# Patient Record
Sex: Female | Born: 1955 | Race: Black or African American | Hispanic: No | Smoking: Former smoker
Health system: Southern US, Community
[De-identification: ages and names within clinical notes are randomized; demographics above are authoritative.]

## PROBLEM LIST (undated history)

## (undated) DIAGNOSIS — I10 Essential (primary) hypertension: Secondary | ICD-10-CM

## (undated) DIAGNOSIS — C801 Malignant (primary) neoplasm, unspecified: Secondary | ICD-10-CM

## (undated) DIAGNOSIS — G4730 Sleep apnea, unspecified: Secondary | ICD-10-CM

## (undated) DIAGNOSIS — C50919 Malignant neoplasm of unspecified site of unspecified female breast: Secondary | ICD-10-CM

## (undated) DIAGNOSIS — K069 Disorder of gingiva and edentulous alveolar ridge, unspecified: Secondary | ICD-10-CM

## (undated) DIAGNOSIS — N63 Unspecified lump in unspecified breast: Secondary | ICD-10-CM

## (undated) DIAGNOSIS — E785 Hyperlipidemia, unspecified: Secondary | ICD-10-CM

## (undated) DIAGNOSIS — Z803 Family history of malignant neoplasm of breast: Secondary | ICD-10-CM

## (undated) DIAGNOSIS — R32 Unspecified urinary incontinence: Secondary | ICD-10-CM

## (undated) DIAGNOSIS — Z1371 Encounter for nonprocreative screening for genetic disease carrier status: Secondary | ICD-10-CM

## (undated) DIAGNOSIS — R7303 Prediabetes: Secondary | ICD-10-CM

## (undated) DIAGNOSIS — Z8042 Family history of malignant neoplasm of prostate: Secondary | ICD-10-CM

## (undated) HISTORY — PX: MASS EXCISION: SHX2000

## (undated) HISTORY — DX: Disorder of gingiva and edentulous alveolar ridge, unspecified: K06.9

## (undated) HISTORY — DX: Unspecified urinary incontinence: R32

## (undated) HISTORY — DX: Family history of malignant neoplasm of prostate: Z80.42

## (undated) HISTORY — PX: COLONOSCOPY: SHX174

## (undated) HISTORY — DX: Malignant (primary) neoplasm, unspecified: C80.1

## (undated) HISTORY — PX: LAPAROSCOPIC GASTRIC BANDING: SHX1100

## (undated) HISTORY — DX: Family history of malignant neoplasm of breast: Z80.3

## (undated) HISTORY — PX: FOOT MASS EXCISION: SHX1663

## (undated) HISTORY — PX: SOFT TISSUE CYST EXCISION: SHX2418

## (undated) HISTORY — DX: Sleep apnea, unspecified: G47.30

## (undated) HISTORY — DX: Essential (primary) hypertension: I10

## (undated) HISTORY — DX: Unspecified lump in unspecified breast: N63.0

## (undated) HISTORY — DX: Encounter for nonprocreative screening for genetic disease carrier status: Z13.71

## (undated) HISTORY — PX: VAGINAL MASS EXCISION: SHX2640

## (undated) HISTORY — DX: Malignant neoplasm of unspecified site of unspecified female breast: C50.919

---

## 1998-08-15 ENCOUNTER — Other Ambulatory Visit: Admission: RE | Admit: 1998-08-15 | Discharge: 1998-08-15 | Payer: Self-pay | Admitting: Obstetrics and Gynecology

## 1999-04-27 ENCOUNTER — Other Ambulatory Visit: Admission: RE | Admit: 1999-04-27 | Discharge: 1999-04-27 | Payer: Self-pay | Admitting: Gynecology

## 1999-05-11 HISTORY — PX: OTHER SURGICAL HISTORY: SHX169

## 1999-06-08 ENCOUNTER — Other Ambulatory Visit: Admission: RE | Admit: 1999-06-08 | Discharge: 1999-06-08 | Payer: Self-pay | Admitting: Gynecology

## 1999-06-08 ENCOUNTER — Encounter (INDEPENDENT_AMBULATORY_CARE_PROVIDER_SITE_OTHER): Payer: Self-pay | Admitting: Specialist

## 1999-08-14 ENCOUNTER — Encounter: Payer: Self-pay | Admitting: Anesthesiology

## 1999-08-14 ENCOUNTER — Inpatient Hospital Stay (HOSPITAL_COMMUNITY): Admission: RE | Admit: 1999-08-14 | Discharge: 1999-08-19 | Payer: Self-pay | Admitting: Gynecology

## 1999-08-15 ENCOUNTER — Encounter: Payer: Self-pay | Admitting: Gynecology

## 2000-11-22 ENCOUNTER — Other Ambulatory Visit: Admission: RE | Admit: 2000-11-22 | Discharge: 2000-11-22 | Payer: Self-pay | Admitting: Gynecology

## 2000-12-07 ENCOUNTER — Ambulatory Visit (HOSPITAL_COMMUNITY): Admission: RE | Admit: 2000-12-07 | Discharge: 2000-12-07 | Payer: Self-pay | Admitting: Gynecology

## 2002-10-18 ENCOUNTER — Other Ambulatory Visit: Admission: RE | Admit: 2002-10-18 | Discharge: 2002-10-18 | Payer: Self-pay | Admitting: Gynecology

## 2002-11-02 ENCOUNTER — Encounter: Payer: Self-pay | Admitting: Gynecology

## 2002-11-02 ENCOUNTER — Encounter: Admission: RE | Admit: 2002-11-02 | Discharge: 2002-11-02 | Payer: Self-pay | Admitting: Gynecology

## 2004-08-27 ENCOUNTER — Other Ambulatory Visit: Admission: RE | Admit: 2004-08-27 | Discharge: 2004-08-27 | Payer: Self-pay | Admitting: Gynecology

## 2004-12-21 ENCOUNTER — Encounter: Admission: RE | Admit: 2004-12-21 | Discharge: 2004-12-21 | Payer: Self-pay | Admitting: Gynecology

## 2006-02-21 ENCOUNTER — Other Ambulatory Visit: Admission: RE | Admit: 2006-02-21 | Discharge: 2006-02-21 | Payer: Self-pay | Admitting: Gynecology

## 2006-06-23 ENCOUNTER — Ambulatory Visit: Payer: Self-pay | Admitting: Vascular Surgery

## 2006-06-23 ENCOUNTER — Ambulatory Visit (HOSPITAL_COMMUNITY): Admission: RE | Admit: 2006-06-23 | Discharge: 2006-06-23 | Payer: Self-pay | Admitting: Internal Medicine

## 2007-03-24 ENCOUNTER — Other Ambulatory Visit: Admission: RE | Admit: 2007-03-24 | Discharge: 2007-03-24 | Payer: Self-pay | Admitting: Gynecology

## 2008-04-12 ENCOUNTER — Ambulatory Visit: Payer: Self-pay | Admitting: Gynecology

## 2008-04-12 ENCOUNTER — Encounter: Payer: Self-pay | Admitting: Gynecology

## 2008-04-12 ENCOUNTER — Other Ambulatory Visit: Admission: RE | Admit: 2008-04-12 | Discharge: 2008-04-12 | Payer: Self-pay | Admitting: Gynecology

## 2008-04-19 ENCOUNTER — Ambulatory Visit: Payer: Self-pay | Admitting: Gynecology

## 2008-07-04 ENCOUNTER — Ambulatory Visit: Payer: Self-pay | Admitting: Gynecology

## 2008-07-26 ENCOUNTER — Ambulatory Visit: Payer: Self-pay | Admitting: Gynecology

## 2009-05-30 ENCOUNTER — Other Ambulatory Visit: Admission: RE | Admit: 2009-05-30 | Discharge: 2009-05-30 | Payer: Self-pay | Admitting: Gynecology

## 2009-05-30 ENCOUNTER — Ambulatory Visit: Payer: Self-pay | Admitting: Gynecology

## 2009-06-06 ENCOUNTER — Ambulatory Visit: Payer: Self-pay | Admitting: Gynecology

## 2009-07-30 ENCOUNTER — Ambulatory Visit: Payer: Self-pay | Admitting: Gynecology

## 2010-01-16 ENCOUNTER — Ambulatory Visit (HOSPITAL_COMMUNITY): Admission: RE | Admit: 2010-01-16 | Discharge: 2010-01-16 | Payer: Self-pay | Admitting: General Surgery

## 2010-01-23 ENCOUNTER — Ambulatory Visit (HOSPITAL_COMMUNITY): Admission: RE | Admit: 2010-01-23 | Discharge: 2010-01-23 | Payer: Self-pay | Admitting: General Surgery

## 2010-02-12 ENCOUNTER — Encounter
Admission: RE | Admit: 2010-02-12 | Discharge: 2010-02-12 | Payer: Self-pay | Source: Home / Self Care | Attending: General Surgery | Admitting: General Surgery

## 2010-05-10 DIAGNOSIS — C801 Malignant (primary) neoplasm, unspecified: Secondary | ICD-10-CM

## 2010-05-10 HISTORY — DX: Malignant (primary) neoplasm, unspecified: C80.1

## 2010-05-10 HISTORY — PX: BREAST LUMPECTOMY: SHX2

## 2010-05-14 ENCOUNTER — Encounter
Admission: RE | Admit: 2010-05-14 | Discharge: 2010-06-09 | Payer: Self-pay | Source: Home / Self Care | Attending: General Surgery | Admitting: General Surgery

## 2010-05-25 LAB — SURGICAL PCR SCREEN
MRSA, PCR: NEGATIVE
Staphylococcus aureus: NEGATIVE

## 2010-05-25 LAB — CBC
HCT: 39.7 % (ref 36.0–46.0)
Hemoglobin: 13.2 g/dL (ref 12.0–15.0)
MCH: 27.6 pg (ref 26.0–34.0)
MCHC: 33.2 g/dL (ref 30.0–36.0)
MCV: 83.1 fL (ref 78.0–100.0)
Platelets: 292 10*3/uL (ref 150–400)
RBC: 4.78 MIL/uL (ref 3.87–5.11)
RDW: 13.6 % (ref 11.5–15.5)
WBC: 4.3 10*3/uL (ref 4.0–10.5)

## 2010-05-25 LAB — COMPREHENSIVE METABOLIC PANEL
ALT: 17 U/L (ref 0–35)
AST: 17 U/L (ref 0–37)
Albumin: 4 g/dL (ref 3.5–5.2)
Alkaline Phosphatase: 57 U/L (ref 39–117)
BUN: 21 mg/dL (ref 6–23)
CO2: 28 mEq/L (ref 19–32)
Calcium: 9.7 mg/dL (ref 8.4–10.5)
Chloride: 99 mEq/L (ref 96–112)
Creatinine, Ser: 0.98 mg/dL (ref 0.4–1.2)
GFR calc Af Amer: >60 mL/min (ref >60)
GFR calc non Af Amer: 59 mL/min — ABNORMAL LOW (ref >60)
Glucose, Bld: 81 mg/dL (ref 70–99)
Potassium: 4 mEq/L (ref 3.5–5.1)
Sodium: 139 mEq/L (ref 135–145)
Total Bilirubin: 0.8 mg/dL (ref 0.3–1.2)
Total Protein: 7.9 g/dL (ref 6.0–8.3)

## 2010-05-25 LAB — DIFFERENTIAL
Basophils Absolute: 0 10*3/uL (ref 0.0–0.1)
Basophils Relative: 0 % (ref 0–1)
Eosinophils Absolute: 0 10*3/uL (ref 0.0–0.7)
Eosinophils Relative: 1 % (ref 0–5)
Lymphocytes Relative: 31 % (ref 12–46)
Lymphs Abs: 1.4 10*3/uL (ref 0.7–4.0)
Monocytes Absolute: 0.4 10*3/uL (ref 0.1–1.0)
Monocytes Relative: 10 % (ref 3–12)
Neutro Abs: 2.5 10*3/uL (ref 1.7–7.7)
Neutrophils Relative %: 58 % (ref 43–77)

## 2010-05-26 ENCOUNTER — Encounter (INDEPENDENT_AMBULATORY_CARE_PROVIDER_SITE_OTHER): Payer: Self-pay | Admitting: General Surgery

## 2010-05-26 ENCOUNTER — Ambulatory Visit (HOSPITAL_COMMUNITY)
Admission: RE | Admit: 2010-05-26 | Discharge: 2010-05-27 | Payer: Self-pay | Source: Home / Self Care | Attending: General Surgery | Admitting: General Surgery

## 2010-05-29 NOTE — Op Note (Signed)
[name], [name]                ACCOUNT NO.:  [id number]  MEDICAL RECORD NO.:  [id number]          PATIENT TYPE:  AMB  LOCATION:  DAY                          FACILITY:  [facility]  PHYSICIAN:  Sharlet Salina T. Ravon Mcilhenny, M.D.DATE OF BIRTH:  1955-11-09  DATE OF PROCEDURE:  05/26/2010 DATE OF DISCHARGE:                              OPERATIVE REPORT   PREOPERATIVE DIAGNOSIS:  Morbid obesity.  POSTOPERATIVE DIAGNOSIS:  Morbid obesity.  SURGICAL PROCEDURE:  Placement of laparoscopic adjustable gastric band.  SURGEON:  Lorne Skeens. Hartlee Amedee, M.D.  ASSISTANT:  Mary Sella. Andrey Campanile, MD  ANESTHESIA:  General.  BRIEF HISTORY:  [name] is a 55 year old female with progressive morbid obesity unresponsive to multiple attempts at medical management. She presents with a BMI of 46.5 and comorbidities of obstructive sleep apnea, hypertension and prediabetes.  After extensive preoperative workup and discussion detailed elsewhere, we have elected to proceed with placement of laparoscopic adjustable gastric band for treatment of her morbid obesity.  DESCRIPTION OF OPERATION:  The patient was brought to the operating room, placed in supine position on the operating table and general endotracheal anesthesia was induced.  She received preoperative IV antibiotics.  PAS were in place.  Subcutaneous heparin was administered. Correct patient and procedure were verified.  The patient additionally had a small skin tag in her left upper quadrant that she wanted excised and this was amputated across its base and the specimen disposed.  It measured just about 2 mm.  Trocar sites were infiltrated with local anesthesia.  Access was obtained in the left upper quadrant midclavicular line with an 11-mm OptiVu trocar without difficulty and pneumoperitoneum established.  The liver was noted to be moderately enlarged and had several cysts apparent.  Also, in the anterior mid left lobe, there was an approximately 2-cm  puckered irregular area which I thought most likely represented scarring secondary to underlying cyst but could not rule out neoplasm and therefore elected to biopsy this prior to completion of the procedure.  Initially, we proceeded with lap band placement by placing a 15-mm trocar in the right upper quadrant, an 11-mm trocar in right upper quadrant midclavicular line, another 11-mm trocar just above and left of the umbilicus for the camera port and a 5- mm trocar in the left flank.  The patient was placed in steep reverse Trendelenburg and through a 5-mm subxiphoid site, the [facility] retractor was placed and left lobe of the liver elevated and although it was enlarged, we did get good exposure of the upper stomach and hiatus. There was quite a bit of fat around the upper stomach.  The angle of His was exposed.  The peritoneum overlying the left crus was incised and careful blunt dissection carried back down along the left crus toward the retrogastric space.  Following this, the pars flaccida was exposed and there was a clear avascular area that was incised and the base of the right crus identified.  We did advance a sizing tube into the stomach and with the balloon inflated to 15 mL, we pulled back snugly against the hiatus with no evidence of hernia.  She did not have  a hernia on upper GI series although does have some mild to moderate reflux.  The balloon was deflated.  The tube pulled back into the upper esophagus.  Peritoneum just anterior to the base of the right crus and crossing fat was incised and then careful blunt dissection was carried back in the retrogastric space.  The finger dissector was passed into this space, advanced and then deployed up through the previously dissected area of the angle of His without difficulty.  An AP standard flush band system was introduced in the abdomen and the tubing placed through the finger dissector and brought back retrogastric.  The  band was then brought back through the retrogastric tunnel without difficulty.  With the sizing tube in place, the band was buckled without any undue tension and the sizing tube was removed.  Holding the band toward the patient's feet, the fundus was imbricated up over the band with a small gastric pouch with 3 interrupted 2-0 Ethibond sutures.  The band appeared to be in excellent position.  The Nathanson retractor was then removed.  I then did a small wedge biopsy with scissors of the abnormal area in the anterior left lobe of the liver and this was sent for permanent section.  There was minimal bleeding, and it was controlled completely with cautery.  The band tubing was brought out through the right mid abdominal trocar site and then all CO2 was evacuated and trocars removed.  This incision was lengthened slightly and a subcutaneous pocket created.  The tubing was cut and the port to which a piece of Prolene mesh had been sutured in the back was attached and this was placed in a subcutaneous position just lateral to the incision and the tubing introduced medially into the abdomen.  This incision was closed with a subcutaneous running 3-0 Vicryl and subcuticular 4-0 Monocryl and then all incisions were further closed with Dermabond.  Sponge and needle counts correct.  The patient was taken to recovery in good condition.     Lorne Skeens. Delancey Moraes, M.D.     Tory Emerald  D:  05/26/2010  T:  05/26/2010  Job:  [id number]  Electronically Signed by Glenna Fellows M.D. on 05/29/2010 09:15:54 AM

## 2010-06-01 LAB — CBC
HCT: 38.7 % (ref 36.0–46.0)
Hemoglobin: 12.4 g/dL (ref 12.0–15.0)
MCH: 26.7 pg (ref 26.0–34.0)
MCHC: 32 g/dL (ref 30.0–36.0)
MCV: 83.2 fL (ref 78.0–100.0)
Platelets: 272 10*3/uL (ref 150–400)
RBC: 4.65 MIL/uL (ref 3.87–5.11)
RDW: 13.9 % (ref 11.5–15.5)
WBC: 6.8 10*3/uL (ref 4.0–10.5)

## 2010-06-01 LAB — DIFFERENTIAL
Basophils Absolute: 0 10*3/uL (ref 0.0–0.1)
Basophils Relative: 0 % (ref 0–1)
Eosinophils Absolute: 0 10*3/uL (ref 0.0–0.7)
Eosinophils Relative: 0 % (ref 0–5)
Lymphocytes Relative: 19 % (ref 12–46)
Lymphs Abs: 1.3 10*3/uL (ref 0.7–4.0)
Monocytes Absolute: 0.4 10*3/uL (ref 0.1–1.0)
Monocytes Relative: 6 % (ref 3–12)
Neutro Abs: 5.1 10*3/uL (ref 1.7–7.7)
Neutrophils Relative %: 75 % (ref 43–77)

## 2010-06-09 ENCOUNTER — Encounter: Admit: 2010-06-09 | Discharge: 2010-06-09 | Payer: Self-pay | Attending: General Surgery | Admitting: General Surgery

## 2010-07-14 ENCOUNTER — Encounter: Payer: BC Managed Care – PPO | Attending: General Surgery | Admitting: *Deleted

## 2010-07-14 DIAGNOSIS — Z713 Dietary counseling and surveillance: Secondary | ICD-10-CM | POA: Insufficient documentation

## 2010-07-14 DIAGNOSIS — Z09 Encounter for follow-up examination after completed treatment for conditions other than malignant neoplasm: Secondary | ICD-10-CM | POA: Insufficient documentation

## 2010-07-14 DIAGNOSIS — Z9884 Bariatric surgery status: Secondary | ICD-10-CM | POA: Insufficient documentation

## 2010-09-07 ENCOUNTER — Encounter: Payer: BC Managed Care – PPO | Attending: General Surgery | Admitting: *Deleted

## 2010-09-07 DIAGNOSIS — Z9884 Bariatric surgery status: Secondary | ICD-10-CM | POA: Insufficient documentation

## 2010-09-07 DIAGNOSIS — Z713 Dietary counseling and surveillance: Secondary | ICD-10-CM | POA: Insufficient documentation

## 2010-09-07 DIAGNOSIS — Z09 Encounter for follow-up examination after completed treatment for conditions other than malignant neoplasm: Secondary | ICD-10-CM | POA: Insufficient documentation

## 2010-09-25 NOTE — Op Note (Signed)
[facility]  Patient:    [name], [name]                       MRN: [id number] Proc. Date: 08/14/99 Adm. Date:  [id number] Attending:  Tonye Royalty                           Operative Report  INDICATIONS:                      A 55 year old, gravida 1, para 0, AB1, with intracavitary uterine defects suspicious for submucous myoma and submucous polyps.  PREOPERATIVE DIAGNOSIS:           Intrauterine polyps and myomas.  POSTOPERATIVE DIAGNOSIS:          Intrauterine polyps and myomas.  OPERATION:                        Resectoscopic polypectomy and resectoscopic                                   myomectomy.  SURGEON:                          Juan H. Lily Peer, M.D.  ANESTHESIA:                       General endotracheal anesthesia.  FINDINGS:                         Submucous myoma, posterior left uterine wall measuring approximately 2 cm in size and also a submucous lower uterine segment  polyp, normal ______  bilateral and also smooth in the cervical canal.  DESCRIPTION OF PROCEDURE:  After the patient was adequately counselled, she was  taken to the operating room where she successfully underwent general endotracheal anesthesia. She was placed in the low lithotomy position.  She received 1 g of Cefotan prophylactically.  Examination under anesthesia after her bladder was evacuated of its contents with red rubber Balducci was demonstrated to be upper  limits of normal and slightly anteverted.  After this, the long weighted bill speculum was inserted into the vaginal vault and a Senn retractor for exposure.  The anterior cervical lip was grasped with a single-tooth tenaculum.  Of note, prior to this, a laminaria was removed that was placed the day before in the office in an effort to facilitate insertion of the operative hysteroscope.  After the vagina was prepped, the uterus sounded to approximately 8 cm.  No dilatation required  due to the effect of the laminaria.  The [facility] operative resectoscope with a double wire loop was inserted into the intrauterine cavity; 3% Sorbitol was the distending media.  The Valleylab electrical surgical generator was utilized with a wattage of 110 on the cutting mode and 80 watts on the coagulation mode.  After the intrauterine cavity was entered, it was noted that in the lower uterine segment, there was a polypoid-like lesion in the lower uterine segment which was excised and passed off the operative field.  After ascertaining hemostasis in the intrauterine cavity, a 2 cm myoma submucous was noted to be located high on the patients left uterine sidewall in the endometrial cavity.  This was also excised  with the Valleylab electrical surgical generator/wire loop.  This was also passed off the operative field.  Of note, in an effort to ascertain adequate visualization, a mm suction curette had previously been inserted to remove blood and clots for better visualization.  Pre and postprocedure pictures were obtained.  The patient tolerated the procedure well.  Fluid deficit of 3% Sorbitol distending media was recorded at 150 cc.  The patients IV fluids was 900 cc of lactated ringers. The single-tooth tenaculum was removed.  The patient was extubated and transferred o recovery room with stable vital signs.  Blood loss was minimal. DD:  08/14/99 TD:  08/15/99 Job: [id number]

## 2010-09-25 NOTE — Discharge Summary (Signed)
[facility]  Patient:    [name], [name]                       MRN: [id number] Adm. Date:  [id number] Disc. Date: [id number] Attending:  Tonye Royalty Dictator:   Antony Contras, [facility]                           Discharge Summary  DISCHARGE DIAGNOSES:              1. Intrauterine polyp/myoma.                                   2. Pulmonary edema, secondary to                                      upper respiratory infection.                                   3. History of cigarette smoking.                                   4. History of respiratory infections                                      prior to surgery.  PROCEDURE:                        Rectoscopic polypectomy/myomectomy.  HISTORY OF PRESENT ILLNESS:       The patient is a 55 year old gravida 1, para 0, AB 1.  She had been evaluated in the office on January 29 and July 08, 1999 respectively.  She had been followed by another practitioner.  An ultrasound had been done due to the fact that she was having pelvic pain.  She was found to have an intracavitary lesion suspicious for submucous myoma.  Other workup had been negative, such as endometrial biopsy and Pap smear.  She was scheduled to undergo rectoscopic myomectomy on August 13, 1999 at 7:30 a.m. at the [facility] COURSE AND TREATMENT:    The patient was admitted for operative procedure and rectoscopic polypectomy/myomectomy was performed by Dr. Lily Peer under general anesthesia.  Estimated blood loss was minimal.                                    Postoperatively the patient developed labored respirations and was transferred to AICU.  Chest x-ray revealed pulmonary edema.  It was felt that she probably had a reactive airway disease from a prior upper respiratory infection at the time of surgery; which resulted in pulmonary edema.  There is also a question of possible pneumonia. Dr. Sandrea Hughs was asked to  follow the patient, along with Dr. Lily Peer.  Her condition did improve.  She was able to be weaned from the oxygen and was able to be discharged on August 19, 1999 in satisfactory condition.  FOLLOW-UP PLAN:                   The patient was to be followed up by both Dr. Lily Peer and Dr. Sherene Sires.  DISCHARGE MEDICATIONS:            Augmentin 875 mg b.i.d./10 days.  DISPOSITION:                      Cleared for discharge by Dr. Sherene Sires. DD:  09/04/99 TD:  09/08/99 Job: [id number]

## 2010-10-26 ENCOUNTER — Other Ambulatory Visit: Payer: Self-pay | Admitting: Radiology

## 2010-10-27 ENCOUNTER — Other Ambulatory Visit: Payer: Self-pay | Admitting: Radiology

## 2010-10-27 DIAGNOSIS — C50912 Malignant neoplasm of unspecified site of left female breast: Secondary | ICD-10-CM

## 2010-10-30 ENCOUNTER — Ambulatory Visit
Admission: RE | Admit: 2010-10-30 | Discharge: 2010-10-30 | Disposition: A | Discharge status: Home / Self Care | Payer: BC Managed Care – PPO | Source: Ambulatory Visit | Attending: Radiology | Admitting: Radiology

## 2010-10-30 DIAGNOSIS — C50912 Malignant neoplasm of unspecified site of left female breast: Secondary | ICD-10-CM

## 2010-10-30 MED ORDER — GADOBENATE DIMEGLUMINE 529 MG/ML IV SOLN
19.0000 mL | Freq: Once | INTRAVENOUS | Status: AC | PRN
  Administered 2010-10-30: 19 mL via INTRAVENOUS

## 2010-11-04 ENCOUNTER — Other Ambulatory Visit: Payer: Self-pay | Admitting: Oncology

## 2010-11-04 ENCOUNTER — Ambulatory Visit (HOSPITAL_BASED_OUTPATIENT_CLINIC_OR_DEPARTMENT_OTHER): Payer: BC Managed Care – PPO | Admitting: Surgery

## 2010-11-04 ENCOUNTER — Encounter (INDEPENDENT_AMBULATORY_CARE_PROVIDER_SITE_OTHER): Payer: Self-pay | Admitting: Surgery

## 2010-11-04 ENCOUNTER — Encounter (HOSPITAL_BASED_OUTPATIENT_CLINIC_OR_DEPARTMENT_OTHER): Payer: BC Managed Care – PPO | Admitting: Oncology

## 2010-11-04 DIAGNOSIS — Z973 Presence of spectacles and contact lenses: Secondary | ICD-10-CM | POA: Insufficient documentation

## 2010-11-04 DIAGNOSIS — C50919 Malignant neoplasm of unspecified site of unspecified female breast: Secondary | ICD-10-CM

## 2010-11-04 DIAGNOSIS — I10 Essential (primary) hypertension: Secondary | ICD-10-CM | POA: Insufficient documentation

## 2010-11-04 DIAGNOSIS — C50119 Malignant neoplasm of central portion of unspecified female breast: Secondary | ICD-10-CM

## 2010-11-04 DIAGNOSIS — C50319 Malignant neoplasm of lower-inner quadrant of unspecified female breast: Secondary | ICD-10-CM

## 2010-11-04 DIAGNOSIS — K069 Disorder of gingiva and edentulous alveolar ridge, unspecified: Secondary | ICD-10-CM | POA: Insufficient documentation

## 2010-11-04 DIAGNOSIS — N63 Unspecified lump in unspecified breast: Secondary | ICD-10-CM | POA: Insufficient documentation

## 2010-11-04 DIAGNOSIS — Z789 Other specified health status: Secondary | ICD-10-CM

## 2010-11-04 DIAGNOSIS — K056 Periodontal disease, unspecified: Secondary | ICD-10-CM

## 2010-11-04 LAB — COMPREHENSIVE METABOLIC PANEL
ALT: 14 U/L (ref 0–35)
AST: 16 U/L (ref 0–37)
Albumin: 3.7 g/dL (ref 3.5–5.2)
Alkaline Phosphatase: 55 U/L (ref 39–117)
BUN: 16 mg/dL (ref 6–23)
CO2: 30 mEq/L (ref 19–32)
Calcium: 9.3 mg/dL (ref 8.4–10.5)
Chloride: 102 mEq/L (ref 96–112)
Creatinine, Ser: 0.76 mg/dL (ref 0.50–1.10)
Glucose, Bld: 107 mg/dL — ABNORMAL HIGH (ref 70–99)
Potassium: 3.5 mEq/L (ref 3.5–5.3)
Sodium: 140 mEq/L (ref 135–145)
Total Bilirubin: 0.3 mg/dL (ref 0.3–1.2)
Total Protein: 7.6 g/dL (ref 6.0–8.3)

## 2010-11-04 LAB — CBC WITH DIFFERENTIAL/PLATELET
BASO%: 0.3 % (ref 0.0–2.0)
Basophils Absolute: 0 10*3/uL (ref 0.0–0.1)
EOS%: 0.8 % (ref 0.0–7.0)
Eosinophils Absolute: 0 10*3/uL (ref 0.0–0.5)
HCT: 37.7 % (ref 34.8–46.6)
HGB: 12.5 g/dL (ref 11.6–15.9)
LYMPH%: 37.3 % (ref 14.0–49.7)
MCH: 27.4 pg (ref 25.1–34.0)
MCHC: 33 g/dL (ref 31.5–36.0)
MCV: 83 fL (ref 79.5–101.0)
MONO#: 0.3 10*3/uL (ref 0.1–0.9)
MONO%: 6.8 % (ref 0.0–14.0)
NEUT#: 2.8 10*3/uL (ref 1.5–6.5)
NEUT%: 54.8 % (ref 38.4–76.8)
Platelets: 267 10*3/uL (ref 145–400)
RBC: 4.54 10*6/uL (ref 3.70–5.45)
RDW: 14.2 % (ref 11.2–14.5)
WBC: 5 10*3/uL (ref 3.9–10.3)
lymph#: 1.9 10*3/uL (ref 0.9–3.3)

## 2010-11-04 LAB — CANCER ANTIGEN 27.29: CA 27.29: 27 U/mL (ref 0–39)

## 2010-11-04 NOTE — Progress Notes (Signed)
Subjective:     Patient ID: [name], female   DOB: December 18, 1955, 55 y.o.   MRN: [id number]    There were no vitals taken for this visit.    HPI 55 YO AAF, patient of Dr. Shelda Altes, with newly diagnosed left breast ca.  Of note, she is a lap band patient of Dr. Jamse Mead.  She had her lap band placed 05/26/2010.  She had a routine mammogram which was abnormal.  She underwent a left breast biopsy on 10/26/2010 which showed a high grade IDC, ER 98%, PR 13%, Ki^& - 69%.  She has no family hx of breast ca.  Her last period was May 2012, but she had not had a period for about one year.  Her MRI done 01/30/2011 showed a 1.8 x 1.2 cm central left breast lesion.  She is at the [facility] [employer] and iNen the room with her were her mother-Doris, her ya ya sister Fulton Mole, and her aunt - Iris.  Review of Systems Neuro - Neg Cards - HTN x 3-4 years, no cards hx Pulm - quit smoking 2002,  Had pulmonary edema after Gyn procedure 2001 GI - neg colonscopy 2010 by Dr. Chip Boer, history of "cyst" of the liver      Had lap band by Dr. 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     Objective:   Physical Exam WNAAF Head - neg Neck - stout, no thryoid mass Lymph nodes - neg cervical, supraclavicular, axillary nodes       [note - she has a 1 cm sebaceous cyst in the posterior right axilla] Breasts - no obvious palpable mass in either breast, I do not even see a bruise from the biopsy in the left breast, her biopsy site is at the 9 o'clock position Lungs - Clear Heart - RRR, no murmur Abd - lap band port palpable, obese Extrem - good strength Neuro - intact     Assessment:     1. T1, N0 left breast cancer 2.  Morbid obesity 3.  S/p Lap Band 4.  HTN     Plan:     Patient is candidate for left breast needle loc lumpectomy (breast conservation) and left axillary  SLNBx  I explained each procedure to her.  I discussed the indications and complications of the procedures.  The potential risks include:  Bleeding, infection, nerve injury, recurrence of the tumor, and the need for further surgery.  I explained that Dr. Johna Sheriff could do the surgery and I can explain my findings to him.  She needs to decide whom she wants to do the surgery.  Dr. Darnelle Catalan is her oncologist, Dr. Calla Kicks. Dayton Scrape is her radiation oncologist.

## 2010-11-04 NOTE — Patient Instructions (Signed)
See [facility] note:  1.  Dr. Darnelle Catalan felt a left cervical lymph node (I did not) - CT neck/chest ordered 2.  Left breast needle loc, left axillary sentinel lymph node biopsy 3.  Oncotype to follow lumpectomy 4.  Radiation tx Dayton Scrape 5.  Anti-estrogen tx - Magrinat

## 2010-11-05 ENCOUNTER — Other Ambulatory Visit (INDEPENDENT_AMBULATORY_CARE_PROVIDER_SITE_OTHER): Payer: Self-pay | Admitting: Surgery

## 2010-11-05 DIAGNOSIS — C50912 Malignant neoplasm of unspecified site of left female breast: Secondary | ICD-10-CM

## 2010-11-16 ENCOUNTER — Other Ambulatory Visit (HOSPITAL_COMMUNITY): Payer: BC Managed Care – PPO

## 2010-12-04 ENCOUNTER — Encounter (INDEPENDENT_AMBULATORY_CARE_PROVIDER_SITE_OTHER): Payer: Self-pay | Admitting: General Surgery

## 2010-12-07 ENCOUNTER — Encounter: Payer: Self-pay | Admitting: *Deleted

## 2010-12-07 ENCOUNTER — Encounter (HOSPITAL_BASED_OUTPATIENT_CLINIC_OR_DEPARTMENT_OTHER)
Admission: RE | Admit: 2010-12-07 | Discharge: 2010-12-07 | Disposition: A | Discharge status: Home / Self Care | Payer: BC Managed Care – PPO | Source: Ambulatory Visit | Attending: Surgery | Admitting: Surgery

## 2010-12-07 ENCOUNTER — Encounter: Payer: BC Managed Care – PPO | Attending: General Surgery | Admitting: *Deleted

## 2010-12-07 DIAGNOSIS — Z09 Encounter for follow-up examination after completed treatment for conditions other than malignant neoplasm: Secondary | ICD-10-CM | POA: Insufficient documentation

## 2010-12-07 DIAGNOSIS — Z713 Dietary counseling and surveillance: Secondary | ICD-10-CM | POA: Insufficient documentation

## 2010-12-07 DIAGNOSIS — Z9884 Bariatric surgery status: Secondary | ICD-10-CM | POA: Insufficient documentation

## 2010-12-07 LAB — POCT I-STAT, CHEM 8
BUN: 15 mg/dL (ref 6–23)
Chloride: 106 mEq/L (ref 96–112)
HCT: 45 % (ref 36.0–46.0)
Sodium: 136 mEq/L (ref 135–145)
TCO2: 25 mmol/L (ref 0–100)

## 2010-12-07 NOTE — Progress Notes (Signed)
  Follow-up visit: 6 Month Post-Operative LAGB Surgery  Medical Nutrition Therapy:  Appt start time: 0830 end time:  0900.  Assessment:  Primary concerns today: post-operative bariatric surgery nutrition management. Pt reports that she was diagnosed with breast cancer. She is working with a team of surgeons (including Dr. Ezzard Standing) for removing the cancer.  Weight today: 210.2 lbs Weight change: 1.9 lb down Total weight lost: 29 lbs total BMI: 41% Weight goal: 155-160 lbs % Weight goal met: 35%  Fluid intake: 40-50 oz Estimated total protein intake: 60-80g  Medications: No changes; see updated list Supplementation: Pt reports she is doing "fair" with supplements. Takes about 50% of time  Using straws: No Drinking while eating :No Hair loss: No Carbonated beverages: No N/V/D/C: None reported Last Lap-Band fill: No recent per pt. She feels like her band is in a "good place". She has had problems with things "getting stuck" with tough or sticky foods.   Recent physical activity:  Tries to be as active as possible. Pt has just returned from a vacation where she was active. She reports moderate activity at the gym 4-5 times/week for 45-60 mins/day.  Progress Towards Goal(s):  In progress.   Nutritional Diagnosis:  [place]-3.3 Overweight/obesity As related to s/p LAGB.  As evidenced by by continuing to follow LAGB dietary guidelines for continued weight loss.    Intervention:    Follow Phase 3B: High Protein + Non-Starchy Vegetables  Eat 3-6 small meals/snacks, every 3-5 hrs  Increase lean protein foods to meet 80g goal  Increase fluid intake to 64oz +  Add 15 grams of carbohydrate (fruit, whole grain, starchy vegetable) with meals  Avoid drinking 15 minutes before, during and 30 minutes after eating  Aim for >30 min of physical activity daily  Monitoring/Evaluation:  Dietary intake, exercise, lap band fills, and body weight. Follow up in 3 months for 9 month post-op  visit.

## 2010-12-07 NOTE — Patient Instructions (Signed)
  Goals:  Follow Phase 3B: High Protein + Non-Starchy Vegetables  Eat 3-6 small meals/snacks, every 3-5 hrs  Increase lean protein foods to meet 80g goal  Increase fluid intake to 64oz +  Add 15 grams of carbohydrate (fruit, whole grain, starchy vegetable) with meals  Avoid drinking 15 minutes before, during and 30 minutes after eating  Aim for >30 min of physical activity daily 

## 2010-12-10 ENCOUNTER — Ambulatory Visit (HOSPITAL_BASED_OUTPATIENT_CLINIC_OR_DEPARTMENT_OTHER)
Admission: RE | Admit: 2010-12-10 | Discharge: 2010-12-10 | Disposition: A | Discharge status: Home / Self Care | Payer: BC Managed Care – PPO | Source: Ambulatory Visit | Attending: Surgery | Admitting: Surgery

## 2010-12-10 ENCOUNTER — Other Ambulatory Visit (INDEPENDENT_AMBULATORY_CARE_PROVIDER_SITE_OTHER): Payer: Self-pay | Admitting: Surgery

## 2010-12-10 ENCOUNTER — Ambulatory Visit (HOSPITAL_COMMUNITY)
Admission: RE | Admit: 2010-12-10 | Discharge: 2010-12-10 | Disposition: A | Discharge status: Home / Self Care | Payer: BC Managed Care – PPO | Source: Ambulatory Visit | Attending: Surgery | Admitting: Surgery

## 2010-12-10 DIAGNOSIS — Z87891 Personal history of nicotine dependence: Secondary | ICD-10-CM | POA: Insufficient documentation

## 2010-12-10 DIAGNOSIS — C50912 Malignant neoplasm of unspecified site of left female breast: Secondary | ICD-10-CM

## 2010-12-10 DIAGNOSIS — Z01812 Encounter for preprocedural laboratory examination: Secondary | ICD-10-CM | POA: Insufficient documentation

## 2010-12-10 DIAGNOSIS — C50919 Malignant neoplasm of unspecified site of unspecified female breast: Secondary | ICD-10-CM | POA: Insufficient documentation

## 2010-12-10 DIAGNOSIS — D0590 Unspecified type of carcinoma in situ of unspecified breast: Secondary | ICD-10-CM | POA: Insufficient documentation

## 2010-12-10 DIAGNOSIS — E669 Obesity, unspecified: Secondary | ICD-10-CM | POA: Insufficient documentation

## 2010-12-10 LAB — POCT HEMOGLOBIN-HEMACUE: Hemoglobin: 12.3 g/dL (ref 12.0–15.0)

## 2010-12-10 MED ORDER — TECHNETIUM TC 99M SULFUR COLLOID FILTERED
1.0000 | Freq: Once | INTRAVENOUS | Status: AC | PRN
  Administered 2010-12-10: 1 via INTRADERMAL

## 2010-12-11 ENCOUNTER — Encounter: Payer: Self-pay | Admitting: Gynecology

## 2010-12-13 NOTE — Op Note (Addendum)
[name], [name]                ACCOUNT NO.:  [id number]  MEDICAL RECORD NO.:  [id number]  LOCATION:  NUC                          FACILITY:  [facility]  PHYSICIAN:  Sandria Bales. Ezzard Standing, M.D.  DATE OF BIRTH:  [birth year]  DATE OF PROCEDURE: 10 December 2010                              OPERATIVE REPORT  [This is the second dictation of the op note.  I have tried to tell Medical Records, but they keep putting in my electronic box.  So I have signed this unread.  Do not rely on this op note.  And if anyone from medical records reads this note, please  contact me.  I doubt that will happen.]  PREOPERATIVE DIAGNOSIS:  Left breast cancer (7 o'clock position), T1 tumor.  POSTOPERATIVE DIAGNOSIS:  Left breast cancer (7 o'clock position), T1 tumor.  PROCEDURE:  Needle local left breast lumpectomy, injection of methylene blue, left axillary sentinel lymph node biopsy.  SURGEON:  Sandria Bales. Ezzard Standing, MD  FIRST ASSISTANT:  None.  ANESTHESIA:  General with 30 mL of 0.25% Marcaine.  COMPLICATIONS:  None.  INDICATIONS FOR PROCEDURE:  [name] is a 55 year old African American female who has a newly diagnosed left breast cancer.  This is an infiltrating ductal carcinoma on biopsy.  It measures approximately 1.8 cm on the MRI.  It is located in the lower inner aspect of her left breast.  She now comes for attempted lumpectomy and planned left axillary sentinel lymph node biopsy.  OPERATIVE NOTE: The patient had a wire placed in her left breast presented to the [facility].  She underwent a general endotracheal anesthetic, supervised by Dr. Hart Robinsons in room #3.  Her left breast was prepped with ChloraPrep.  She was given 1 gram of Ancef at initial procedure.  A time-out was held and surgical checklist run.  In the preoperative area, we injected with technetium sulfa colloid 1 mCi.  I injected about 1.5 mL of methylene blue in her subareolar space.  I made incision first in the left  axilla where I found a hot axillary lymph node with counts of about 2200.  It was bluish tinge.  Lymph node was removed and sent to Pathology.  It had background counts of about 30 with no other hot node.  There was no supraclavicular or mediastinal hot node.  That was identified with the Neoprobe.  I then turned my attention to the lower aspect of her left breast.  She had a wire coming out at about 7 o'clock position.  I made an ellipse of skin and cut down and excised a block of breast tissue approximately 5 cm in diameter.  This was then taken out, painted with the 6 color paint kit for orientation purposes and a mammogram showed the wire and the clip in the middle of the specimen.  This was then sent to Pathology.  I felt I was a little close on the deep margin.  There was some bruising on the area.  I went on to excise about another 5-6 mm thickness of deep margin with a long suture cranial, short suture medial and medial sutures towards the sternum and the medial pannus  towards the sternum.  I then irrigated both wounds.  The subcutaneous tissues were closed with 3-0 Vicryl suture.  The skin closed with 5-0 Monocryl suture.  The wound painted with Dermabond.  She was then sterilely wrapped in a pressure dressing.  She tolerated the procedure well.  The sponge and needle count were correct at the end of the case.  The final pathology is pending at the time of this dictation.     Sandria Bales. Ezzard Standing, M.D.     DHN/MEDQ  D:  12/10/2010  T:  12/10/2010  Job:  [id number]  cc:   Kari Baars, M.D. Lowella Dell, M.D. Maryln Gottron, M.D.  Electronically Signed by Ovidio Kin M.D. on 12/22/2010 07:58:58 AM

## 2010-12-15 ENCOUNTER — Encounter (INDEPENDENT_AMBULATORY_CARE_PROVIDER_SITE_OTHER): Payer: Self-pay

## 2010-12-17 ENCOUNTER — Encounter (INDEPENDENT_AMBULATORY_CARE_PROVIDER_SITE_OTHER): Payer: Self-pay | Admitting: Surgery

## 2010-12-17 ENCOUNTER — Ambulatory Visit (INDEPENDENT_AMBULATORY_CARE_PROVIDER_SITE_OTHER): Payer: BC Managed Care – PPO | Admitting: Surgery

## 2010-12-17 DIAGNOSIS — C50919 Malignant neoplasm of unspecified site of unspecified female breast: Secondary | ICD-10-CM

## 2010-12-17 NOTE — Patient Instructions (Addendum)
Doing well.  Given a copy of the pathology report.  May start swimming 12/31/10.  Return to see me in 6 months.

## 2010-12-17 NOTE — Progress Notes (Addendum)
[facility] patient  HPI : 55 YO AAF, patient of Dr. Shelda Altes, with newly diagnosed left breast ca. Of note, she is a lap band patient of Dr. Jamse Mead. She had her lap band placed 05/26/2010.   She had a routine mammogram which was abnormal. She underwent a left breast biopsy on 10/26/2010 which showed a high grade IDC, ER 98%, PR 13%, Ki67 - 69%. Her MRI done 01/30/2011 showed a 1.8 x 1.2 cm central left breast lesion.   The patient underwent a left breast lumpectomy and left sentinel lymph node axillary node biopsy on 10 December 2010.  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 She has 0 of 2 nodes involved. Her tumor is ER and PR receptor positive, Ki-67 is 13%, and her HER-2/neu is negative.  She is accompanied with her mother.  I gave her a copy of the path report.  She is also seeing Dr. Chipper Herb and Dr. Marikay Alar Magrinat for further evaluation.  Review of Systems: Neuro - Neg  Cards - HTN x 3-4 years, no cards hx  Pulm - quit smoking 2002, Had pulmonary edema after Gyn procedure 2001  GI - neg colonscopy 2010 by Dr. Chip Boer, history of "cyst" of the liver   Had lap band by Dr. 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   Objective:   Physical Exam   Breasts - The incision at the inner aspect of her left breast and the left axilla are both well healed and look good.   Assessment and Plan: 1. T1, N0 left breast cancer   To see Dr. Dayton Scrape for rad tx and Dr. Darnelle Catalan for adjuvant therapy.  Follow up with me in 6 months.  She is to wait for at least 3 weeks from surgery to swim.  Note given to return to work 12/21/10.  2. Morbid obesity. 3. S/p Lap Band. 4. HTN

## 2010-12-25 ENCOUNTER — Encounter (INDEPENDENT_AMBULATORY_CARE_PROVIDER_SITE_OTHER): Payer: BC Managed Care – PPO | Admitting: Surgery

## 2010-12-28 ENCOUNTER — Telehealth (INDEPENDENT_AMBULATORY_CARE_PROVIDER_SITE_OTHER): Payer: Self-pay | Admitting: General Surgery

## 2010-12-28 NOTE — Telephone Encounter (Signed)
PT CALLED RE PINK AREA NEXT TO INCISION AREA ALSO FEELS FIRM. NOTED Saturday. NO FEVER OR DRAINAGE. DR. Ezzard Standing NOTIFIED AND SUGGESTED SHE CAN SEE HIM THIS WEEK IN OFFICE. PT IS SCHEDULED FOR 12-31-10. SHE STATED SHE HAS APPT WITH DR. MAGRINAT ON Wednesday 12-30-10. SHE WILL LET us KNOW THE OUTCOME.

## 2010-12-30 ENCOUNTER — Other Ambulatory Visit: Payer: Self-pay | Admitting: Oncology

## 2010-12-30 ENCOUNTER — Encounter (HOSPITAL_BASED_OUTPATIENT_CLINIC_OR_DEPARTMENT_OTHER): Payer: BC Managed Care – PPO | Admitting: Oncology

## 2010-12-30 DIAGNOSIS — C50319 Malignant neoplasm of lower-inner quadrant of unspecified female breast: Secondary | ICD-10-CM

## 2010-12-30 LAB — COMPREHENSIVE METABOLIC PANEL
ALT: 11 U/L (ref 0–35)
AST: 13 U/L (ref 0–37)
Albumin: 4.1 g/dL (ref 3.5–5.2)
CO2: 27 mEq/L (ref 19–32)
Calcium: 9 mg/dL (ref 8.4–10.5)
Chloride: 106 mEq/L (ref 96–112)
Creatinine, Ser: 0.78 mg/dL (ref 0.50–1.10)
Potassium: 3.9 mEq/L (ref 3.5–5.3)
Sodium: 141 mEq/L (ref 135–145)
Total Protein: 6.8 g/dL (ref 6.0–8.3)

## 2010-12-30 LAB — CBC WITH DIFFERENTIAL/PLATELET
BASO%: 0.4 % (ref 0.0–2.0)
EOS%: 0.6 % (ref 0.0–7.0)
HCT: 37.2 % (ref 34.8–46.6)
MCHC: 33.5 g/dL (ref 31.5–36.0)
MONO#: 0.4 10*3/uL (ref 0.1–0.9)
NEUT%: 62.5 % (ref 38.4–76.8)
RDW: 14.2 % (ref 11.2–14.5)
WBC: 5.6 10*3/uL (ref 3.9–10.3)
lymph#: 1.7 10*3/uL (ref 0.9–3.3)

## 2010-12-31 ENCOUNTER — Encounter (INDEPENDENT_AMBULATORY_CARE_PROVIDER_SITE_OTHER): Payer: Self-pay | Admitting: Surgery

## 2010-12-31 ENCOUNTER — Ambulatory Visit (INDEPENDENT_AMBULATORY_CARE_PROVIDER_SITE_OTHER): Payer: BC Managed Care – PPO | Admitting: Surgery

## 2010-12-31 VITALS — Wt 211.5 lb

## 2010-12-31 DIAGNOSIS — Z853 Personal history of malignant neoplasm of breast: Secondary | ICD-10-CM

## 2010-12-31 DIAGNOSIS — N63 Unspecified lump in unspecified breast: Secondary | ICD-10-CM

## 2010-12-31 NOTE — Patient Instructions (Signed)
Stop the antibiotics.  Follow up in 3weeks

## 2010-12-31 NOTE — Progress Notes (Signed)
[name]  HPI : 55 YO AAF, [name] of Dr. Shelda [name], with newly diagnosed left breast ca. Of note, she is a lap band [name] of Dr. Jamse [name]. She had her lap band placed 05/26/2010.   She had a routine mammogram which was abnormal. She underwent a left breast biopsy on 10/26/2010 which showed a high grade IDC, ER 98%, PR 13%, Ki67 - 69%. Her MRI done 01/30/2011 showed a 1.8 x 1.2 cm central left breast lesion.   The [name] underwent a left breast lumpectomy and left sentinel lymph node axillary node biopsy on 10 December 2010.  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 She has 0 of 2 nodes involved. Her tumor is ER and PR receptor positive, Ki-67 is 13%, and her HER-2/neu is negative.   She was doing well until this past Saturday, 12/26/10, when she noticed swelling of her left breast at her incision site.  She saw Dr. Darnelle [name] who put her on Keflex.  She is having minimal discomfort, she just notices this mass.  She is also seeing Dr. Chipper [name]  for further evaluation.  Review of Systems: Neuro - Neg  Cards - HTN x 3-4 years, no cards hx  Pulm - quit smoking 2002, Had pulmonary edema after Gyn procedure 2001  GI - neg colonscopy 2010 by Dr. Chip [name], history of "cyst" of the liver   Had lap band by Dr. 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   Objective:   Physical Exam   Breasts - Mass effect at left breast biopsy lumpectomy site.  No redness or tenderness.  Remainder of left breast is unremarkable.   Assessment and Plan: 1. T1, N0 left breast cancer   2.  Probable hematoma of left breast.  This could be a seroma, but it came on so quickly, I think this is secondary to a delayed bleed into the lumpectomy site.  I do not think that antibiotics will change the course of this, so I advised her to stop the antibiotics.   This will probably take 3 to 6 months to resolve.  I will see her back in 2 to 3 weeks to check the incision.  I don' know how this will interfere with her radiation therapy.  We will see what Dr. Dayton [name] says.  2. Morbid obesity. 3. S/p Lap Band. 4. HTN

## 2011-01-05 DIAGNOSIS — C50119 Malignant neoplasm of central portion of unspecified female breast: Secondary | ICD-10-CM

## 2011-01-06 ENCOUNTER — Ambulatory Visit
Admission: RE | Admit: 2011-01-06 | Discharge: 2011-01-06 | Disposition: A | Discharge status: Home / Self Care | Payer: BC Managed Care – PPO | Source: Ambulatory Visit | Attending: Radiation Oncology | Admitting: Radiation Oncology

## 2011-01-06 DIAGNOSIS — C50919 Malignant neoplasm of unspecified site of unspecified female breast: Secondary | ICD-10-CM | POA: Insufficient documentation

## 2011-01-06 DIAGNOSIS — D0590 Unspecified type of carcinoma in situ of unspecified breast: Secondary | ICD-10-CM | POA: Insufficient documentation

## 2011-01-22 ENCOUNTER — Ambulatory Visit (INDEPENDENT_AMBULATORY_CARE_PROVIDER_SITE_OTHER): Payer: BC Managed Care – PPO | Admitting: Surgery

## 2011-01-25 ENCOUNTER — Encounter (HOSPITAL_BASED_OUTPATIENT_CLINIC_OR_DEPARTMENT_OTHER): Payer: BC Managed Care – PPO | Admitting: Oncology

## 2011-01-25 DIAGNOSIS — C50119 Malignant neoplasm of central portion of unspecified female breast: Secondary | ICD-10-CM

## 2011-01-25 DIAGNOSIS — C50319 Malignant neoplasm of lower-inner quadrant of unspecified female breast: Secondary | ICD-10-CM

## 2011-01-28 ENCOUNTER — Ambulatory Visit (INDEPENDENT_AMBULATORY_CARE_PROVIDER_SITE_OTHER): Payer: BC Managed Care – PPO | Admitting: Surgery

## 2011-01-28 ENCOUNTER — Encounter (INDEPENDENT_AMBULATORY_CARE_PROVIDER_SITE_OTHER): Payer: Self-pay | Admitting: Surgery

## 2011-01-28 VITALS — BP 126/78 | HR 60 | Temp 97.5°F | Resp 20 | Ht 60.0 in | Wt 210.0 lb

## 2011-01-28 DIAGNOSIS — Z853 Personal history of malignant neoplasm of breast: Secondary | ICD-10-CM

## 2011-01-28 NOTE — Progress Notes (Addendum)
[facility] patient  HPI : 55 YO AAF, patient of Dr. Shelda Altes, with newly diagnosed left breast ca. Of note, she is a lap band patient of Dr. Jamse Mead. She had her lap band placed 05/26/2010.   She had a routine mammogram which was abnormal. She underwent a left breast biopsy on 10/26/2010 which showed a high grade IDC, ER 98%, PR 13%, Ki67 - 69%. Her MRI done 01/30/2011 showed a 1.8 x 1.2 cm central left breast lesion.   The patient underwent a left breast lumpectomy and left sentinel lymph node axillary node biopsy on 10 December 2010.  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 She has 0 of 2 nodes involved. Her tumor is ER and PR receptor positive, Ki-67 is 13%, and her HER-2/neu is positive.  The signal was 2.3.   She had an Oncotype which was 43.  The distant recurrence risk was 29%.  She has talked to Dr. Darnelle Catalan about chemotx.  I saw her 12/31/2010 for swelling of her left breast at her incision site.   She is also seeing Dr. Chipper Herb  for further evaluation.  Review of Systems: Neuro - Neg  Cards - HTN x 3-4 years, no cards hx  Pulm - quit smoking 2002, Had pulmonary edema after Gyn procedure 2001  GI - neg colonscopy 2010 by Dr. Chip Boer, history of "cyst" of the liver   Had lap band by Dr. 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   Objective:   Physical Exam  BP 126/78  Pulse 60  Temp 97.5 F (36.4 C)  Resp 20  Ht 5' (1.524 m)  Wt 210 lb (95.255 kg)  BMI 41.01 kg/m2  Breasts - Now has dimpled skin at her left breast biopsy lumpectomy site.  The seroma/hematoma is better.   Assessment and Plan: 1. T1, N0 left breast cancer.  Her2neu is positive.  Plan for chemotx.  I am scheduled to place a power port on 02/11/2011. I reviewed with the patient the indications and complications of a power port.  The risks include  bleeding, infection, pneumothorax, and blood clots.   2.  Hematoma of left breast.  Much better today  2. Morbid obesity. 3. S/p Lap Band.  We talked about the lap band.  We will not try to adjust this until she has completed her chemotx/radiation tx. 4. HTN

## 2011-02-08 ENCOUNTER — Other Ambulatory Visit (INDEPENDENT_AMBULATORY_CARE_PROVIDER_SITE_OTHER): Payer: Self-pay | Admitting: Surgery

## 2011-02-08 ENCOUNTER — Encounter (HOSPITAL_COMMUNITY): Payer: BC Managed Care – PPO

## 2011-02-08 ENCOUNTER — Ambulatory Visit (HOSPITAL_COMMUNITY)
Admission: RE | Admit: 2011-02-08 | Discharge: 2011-02-08 | Disposition: A | Discharge status: Home / Self Care | Payer: BC Managed Care – PPO | Source: Ambulatory Visit | Attending: Surgery | Admitting: Surgery

## 2011-02-08 DIAGNOSIS — Z01812 Encounter for preprocedural laboratory examination: Secondary | ICD-10-CM | POA: Insufficient documentation

## 2011-02-08 DIAGNOSIS — C50919 Malignant neoplasm of unspecified site of unspecified female breast: Secondary | ICD-10-CM | POA: Insufficient documentation

## 2011-02-08 DIAGNOSIS — I10 Essential (primary) hypertension: Secondary | ICD-10-CM | POA: Insufficient documentation

## 2011-02-08 DIAGNOSIS — Z01810 Encounter for preprocedural cardiovascular examination: Secondary | ICD-10-CM | POA: Insufficient documentation

## 2011-02-08 DIAGNOSIS — Z01811 Encounter for preprocedural respiratory examination: Secondary | ICD-10-CM

## 2011-02-08 DIAGNOSIS — Z01818 Encounter for other preprocedural examination: Secondary | ICD-10-CM | POA: Insufficient documentation

## 2011-02-08 LAB — BASIC METABOLIC PANEL
CO2: 29 mEq/L (ref 19–32)
Calcium: 9.8 mg/dL (ref 8.4–10.5)
Creatinine, Ser: 0.69 mg/dL (ref 0.50–1.10)
GFR calc non Af Amer: >90 mL/min (ref >90)
Sodium: 139 mEq/L (ref 135–145)

## 2011-02-08 LAB — CBC
MCH: 26.5 pg (ref 26.0–34.0)
MCHC: 32.7 g/dL (ref 30.0–36.0)
MCV: 81.1 fL (ref 78.0–100.0)
Platelets: 301 10*3/uL (ref 150–400)
RDW: 14 % (ref 11.5–15.5)

## 2011-02-08 LAB — SURGICAL PCR SCREEN: MRSA, PCR: NEGATIVE

## 2011-02-11 ENCOUNTER — Ambulatory Visit (HOSPITAL_COMMUNITY): Payer: BC Managed Care – PPO

## 2011-02-11 ENCOUNTER — Ambulatory Visit (HOSPITAL_COMMUNITY)
Admission: RE | Admit: 2011-02-11 | Discharge: 2011-02-11 | Disposition: A | Discharge status: Home / Self Care | Payer: BC Managed Care – PPO | Source: Ambulatory Visit | Attending: Surgery | Admitting: Surgery

## 2011-02-11 DIAGNOSIS — I10 Essential (primary) hypertension: Secondary | ICD-10-CM | POA: Insufficient documentation

## 2011-02-11 DIAGNOSIS — Z01810 Encounter for preprocedural cardiovascular examination: Secondary | ICD-10-CM | POA: Insufficient documentation

## 2011-02-11 DIAGNOSIS — C50919 Malignant neoplasm of unspecified site of unspecified female breast: Secondary | ICD-10-CM

## 2011-02-11 DIAGNOSIS — G4733 Obstructive sleep apnea (adult) (pediatric): Secondary | ICD-10-CM | POA: Insufficient documentation

## 2011-02-11 DIAGNOSIS — Z01812 Encounter for preprocedural laboratory examination: Secondary | ICD-10-CM | POA: Insufficient documentation

## 2011-02-11 DIAGNOSIS — Z01818 Encounter for other preprocedural examination: Secondary | ICD-10-CM | POA: Insufficient documentation

## 2011-02-11 HISTORY — PX: PORTACATH PLACEMENT: SHX2246

## 2011-02-15 NOTE — Op Note (Signed)
[name], [name]                ACCOUNT NO.:  [id number]  MEDICAL RECORD NO.:  [id number]  LOCATION:  DAYL                         FACILITY:  [facility]  PHYSICIAN:  Sandria Bales. Ezzard Standing, M.D.  DATE OF BIRTH:  Oct 24, 1955  DATE OF PROCEDURE: 11 Feb 2011                              OPERATIVE REPORT   PREOPERATIVE DIAGNOSIS:  Left breast cancer (T1c N0).  POSTOPERATIVE DIAGNOSIS:  Left breast cancer (T1c N0), HER-2/neu was positive, need of IV access.  PROCEDURE:  Right subclavian PowerPort.  SURGEON:  Sandria Bales. Ezzard Standing, M.D.  FIRST ASSISTANT:  None.  ANESTHESIA:  MAC with 15 cc of Xylocaine.  COMPLICATIONS:  None.  INDICATION FOR PROCEDURE:  [name] is a 55 year old African American female who is a patient of Dr. Foye Deer.  She is sees my partner Dr. Glenna Fellows, for a lap band surgery and management.  Dr. Johna Sheriff, I think, was out of town and she was found to have a left breast cancer, which I have managed.  On December 10, 2010, she underwent a left breast lumpectomy and sentinel lymph node biopsy.  Her final pathology showed 1.1 cm invasive ductal carcinoma of left breast with 0 of 2 nodes involved.  Her tumor is HER-2/neu positive, Oncotype was 43 with a recurrence risk of 29%, so she is talked to Dr. Darnelle Catalan about adjuvant chemotherapy.  I reviewed with the patient the indications, potential complications of Power Port placement.  The risks include bleeding, infection, pneumothorax, and nerve injury.  OPERATIVE NOTE:  The patient placed a supine position with a roll under her back, both her arms were tucked.  Her neck, breast, and shoulder were prepped with ChloraPrep and sterilely draped.    A time-out was held and surgical checklist run.  She was given 1 g of Ancef at the initiation of procedure.  I accessed the right subclavian vein with the 16 gauge needle through a single stick.  I got to the right subclavian vein threaded a guidewire.  I checked the  guidewire position with fluoroscopy.  I then developed a pocket in the upper aspect of the right breast.  I passed the silastic tubing from the pocket site in the UIQ of the right breast to the right subclavian site and introduced it with an 8-French introducer.  I positioned the tubing at the junction of the superior vena cava and right atrium.  This position was checked with fluoroscopy.  The entire tubing was flushed with dilute heparin solution, 10 units/cc.  I then attached the Silastic tubing to the power port with attachment device and sewed in place with 3-0 Vicryl sutures.  The entire unit first flushed with dilute heparin and then flushed with 5 cc of concentrated heparin, which was 100 units per cc.  The position of the Port-A-Cath tubing and the tip was checked with fluoroscopy, which looked to be in good position.  I then irrigated the wound, closed the subcutaneous tissues with 3-0 Vicryl suture and the skin with a 5-0 Vicryl suture, painted the wound with tincture of benzoin and steri-stripped it.  The patient tolerated the procedure well, was transported to the recovery room in good condition.  Sponge and needle counts were correct at the end of the case. Chest x-ray is pending.   Sandria Bales. Ezzard Standing, M.D., FACS   DHN/MEDQ  D:  02/11/2011  T:  02/12/2011  Job:  [id number]  cc:   Lowella Dell, M.D. Fax: [phone]  Foye Deer, MD Fax: [phone]  Maryln Gottron, M.D. Fax: [phone]  Electronically Signed by Ovidio Kin M.D. on 02/15/2011 09:30:43 AM

## 2011-02-16 ENCOUNTER — Telehealth (INDEPENDENT_AMBULATORY_CARE_PROVIDER_SITE_OTHER): Payer: Self-pay

## 2011-02-16 NOTE — Telephone Encounter (Signed)
I called pt to check on her postop.  She had a few questions about her incision and starting chemo this Thursday.  Her steristrips are still on there which I told her that they stay on 7 days.  The cancer center can take the bandage off and assess if there is any concern with the incision.  We will see her October 26th unless there is a sooner problem.

## 2011-02-18 ENCOUNTER — Encounter (HOSPITAL_BASED_OUTPATIENT_CLINIC_OR_DEPARTMENT_OTHER): Payer: BC Managed Care – PPO | Admitting: Oncology

## 2011-02-18 ENCOUNTER — Other Ambulatory Visit: Payer: Self-pay | Admitting: Oncology

## 2011-02-18 ENCOUNTER — Ambulatory Visit (HOSPITAL_COMMUNITY)
Admission: RE | Admit: 2011-02-18 | Discharge: 2011-02-18 | Disposition: A | Discharge status: Home / Self Care | Payer: BC Managed Care – PPO | Source: Ambulatory Visit | Attending: Oncology | Admitting: Oncology

## 2011-02-18 DIAGNOSIS — C50919 Malignant neoplasm of unspecified site of unspecified female breast: Secondary | ICD-10-CM

## 2011-02-18 DIAGNOSIS — C50119 Malignant neoplasm of central portion of unspecified female breast: Secondary | ICD-10-CM

## 2011-02-18 DIAGNOSIS — C50319 Malignant neoplasm of lower-inner quadrant of unspecified female breast: Secondary | ICD-10-CM

## 2011-02-18 LAB — CBC WITH DIFFERENTIAL/PLATELET
BASO%: 0.2 % (ref 0.0–2.0)
Eosinophils Absolute: 0.1 10*3/uL (ref 0.0–0.5)
LYMPH%: 33.4 % (ref 14.0–49.7)
MCHC: 33 g/dL (ref 31.5–36.0)
MCV: 81.1 fL (ref 79.5–101.0)
MONO#: 0.3 10*3/uL (ref 0.1–0.9)
MONO%: 6.3 % (ref 0.0–14.0)
NEUT#: 2.8 10*3/uL (ref 1.5–6.5)
Platelets: 272 10*3/uL (ref 145–400)
RBC: 4.6 10*6/uL (ref 3.70–5.45)
RDW: 14.4 % (ref 11.2–14.5)
WBC: 4.7 10*3/uL (ref 3.9–10.3)
nRBC: 0 % (ref 0–0)

## 2011-02-25 ENCOUNTER — Encounter (HOSPITAL_BASED_OUTPATIENT_CLINIC_OR_DEPARTMENT_OTHER): Payer: BC Managed Care – PPO | Admitting: Oncology

## 2011-02-25 DIAGNOSIS — C50119 Malignant neoplasm of central portion of unspecified female breast: Secondary | ICD-10-CM

## 2011-02-25 DIAGNOSIS — Z5111 Encounter for antineoplastic chemotherapy: Secondary | ICD-10-CM

## 2011-02-26 ENCOUNTER — Encounter (HOSPITAL_BASED_OUTPATIENT_CLINIC_OR_DEPARTMENT_OTHER): Payer: BC Managed Care – PPO | Admitting: Oncology

## 2011-02-26 DIAGNOSIS — Z5189 Encounter for other specified aftercare: Secondary | ICD-10-CM

## 2011-02-26 DIAGNOSIS — C50119 Malignant neoplasm of central portion of unspecified female breast: Secondary | ICD-10-CM

## 2011-02-27 ENCOUNTER — Other Ambulatory Visit: Payer: Self-pay | Admitting: Physician Assistant

## 2011-03-04 ENCOUNTER — Encounter: Payer: Self-pay | Admitting: *Deleted

## 2011-03-04 ENCOUNTER — Encounter: Payer: BC Managed Care – PPO | Attending: General Surgery | Admitting: *Deleted

## 2011-03-04 DIAGNOSIS — Z09 Encounter for follow-up examination after completed treatment for conditions other than malignant neoplasm: Secondary | ICD-10-CM | POA: Insufficient documentation

## 2011-03-04 DIAGNOSIS — Z9884 Bariatric surgery status: Secondary | ICD-10-CM | POA: Insufficient documentation

## 2011-03-04 DIAGNOSIS — Z713 Dietary counseling and surveillance: Secondary | ICD-10-CM | POA: Insufficient documentation

## 2011-03-04 NOTE — Progress Notes (Signed)
  Follow-up visit: 9 Months Post-Operative LAGB Surgery  Medical Nutrition Therapy:  Appt start time: 1610 end time:  1640.  Assessment:  Primary concerns today: post-operative bariatric surgery nutrition management and oncology nutrition. [name] reports that she is doing "as good as can be expected" s/p lumpectomy and recent chemotherapy treatment. She noted that Dr. Ezzard Standing does not want to adjust her band during this time of chemo. She reports that after her first chemo treatment she struggled with "horrible" constipation. She used magnesium citrate (per her PCP) and ended up with severe diarrhea. She aims to drink >64 oz fluid per day. Discussed high fiber therapy for patient. Per her oncology class at the cancer center they recommended she increase her carbohydrate intake. She notes that beans, bread, rice, fruits, pasta, potatoes, and most all grains "get stuck" due to LAGB. Discussed strategies for adding healthy, high fiber carbs to diet via soft/moist cooking methods. Recommended 1/2 cup fruit blended with her protein drink several times throughout the day. She may discuss a band adjustment with Dr. Ezzard Standing if symptoms that keep her from eating persist.  Weight today: 207.7 lbs Weight change: 2.5 lbs Total weight lost: 31.5 lbs total BMI: 40.6% Weight goal: 155-160 lbs Surgery date: 05/26/10  24-hr recall: No food recall given at time of visit*  Fluid intake: 64 oz+ (sugar-free) Estimated total protein intake: 60-65g  Medications: See updated medication list Supplementation: Taking supplements regularly  Using straws: No Drinking while eating:No Hair loss: No Carbonated beverages: No N/V/D/C: Constipation reported Last Lap-Band fill: No recent band fill  Recent physical activity:  Limited physical activity levels  Progress Towards Goal(s):  In progress.  Handouts given during visit include: NCM's:  List of Antioxidant rich foods  High Fiber Diet Therapy  Low-Microbial  Diet   Nutritional Diagnosis:  NI-5.1 Increased nutrient needs (specify): (calories/protein) As related to chemotherapy treatment and cancer.  As evidenced by pt with increased need for kcals/protein s/p lumpectomy and LAGB .    Intervention:  Nutrition education.  Monitoring/Evaluation:  Dietary intake, exercise, lap band fills, and body weight. Follow up in 2-3 months for 12 month post-op visit.

## 2011-03-04 NOTE — Patient Instructions (Signed)
Goals:  Eat 3-6 small meals/snacks, every 3-5 hrs  Increase lean protein foods to meet 60-70g goal  Continue with fluid intake of 64oz +  Consume 1/2 to 3/4 cup of carbohydrate (fruit, whole grain, starchy vegetable) with meals as tolerated  Avoid drinking 15 minutes before, during and 30 minutes after eating; Alternate food and fluid  Aim for >30 min of physical activity daily  Follow Low-Microbial Diet

## 2011-03-05 ENCOUNTER — Encounter (INDEPENDENT_AMBULATORY_CARE_PROVIDER_SITE_OTHER): Payer: BC Managed Care – PPO | Admitting: Surgery

## 2011-03-05 NOTE — Progress Notes (Deleted)
CANCELED APPOINTMENT

## 2011-03-16 ENCOUNTER — Telehealth: Payer: Self-pay | Admitting: Oncology

## 2011-03-16 NOTE — Telephone Encounter (Signed)
pt called and  cancelled appts for 03/25/23 and Mar 26, 2023 because of death in the family.  s/w amy and she will have appts r/s to 11/15 and 11/15.  rtn call to pt and lmovm of new appt d/t

## 2011-03-17 ENCOUNTER — Encounter (INDEPENDENT_AMBULATORY_CARE_PROVIDER_SITE_OTHER): Payer: Self-pay | Admitting: Surgery

## 2011-03-18 ENCOUNTER — Ambulatory Visit: Payer: BC Managed Care – PPO | Admitting: Physician Assistant

## 2011-03-18 ENCOUNTER — Ambulatory Visit: Payer: BC Managed Care – PPO

## 2011-03-18 ENCOUNTER — Other Ambulatory Visit: Payer: BC Managed Care – PPO | Admitting: Lab

## 2011-03-19 ENCOUNTER — Ambulatory Visit: Payer: BC Managed Care – PPO

## 2011-03-23 NOTE — Progress Notes (Signed)
This encounter was created in error - please disregard.

## 2011-03-24 ENCOUNTER — Other Ambulatory Visit: Payer: Self-pay | Admitting: Oncology

## 2011-03-25 ENCOUNTER — Other Ambulatory Visit (HOSPITAL_BASED_OUTPATIENT_CLINIC_OR_DEPARTMENT_OTHER): Payer: BC Managed Care – PPO | Admitting: Lab

## 2011-03-25 ENCOUNTER — Telehealth: Payer: Self-pay | Admitting: *Deleted

## 2011-03-25 ENCOUNTER — Other Ambulatory Visit: Payer: Self-pay | Admitting: Oncology

## 2011-03-25 ENCOUNTER — Ambulatory Visit (HOSPITAL_BASED_OUTPATIENT_CLINIC_OR_DEPARTMENT_OTHER): Payer: BC Managed Care – PPO

## 2011-03-25 ENCOUNTER — Encounter: Payer: Self-pay | Admitting: Physician Assistant

## 2011-03-25 ENCOUNTER — Ambulatory Visit (HOSPITAL_BASED_OUTPATIENT_CLINIC_OR_DEPARTMENT_OTHER): Payer: BC Managed Care – PPO | Admitting: Physician Assistant

## 2011-03-25 VITALS — BP 162/95 | HR 69

## 2011-03-25 VITALS — BP 158/92 | HR 76 | Temp 98.6°F | Ht 59.5 in | Wt 212.6 lb

## 2011-03-25 DIAGNOSIS — Z170 Estrogen receptor positive status [ER+]: Secondary | ICD-10-CM

## 2011-03-25 DIAGNOSIS — Z5111 Encounter for antineoplastic chemotherapy: Secondary | ICD-10-CM

## 2011-03-25 DIAGNOSIS — C50119 Malignant neoplasm of central portion of unspecified female breast: Secondary | ICD-10-CM

## 2011-03-25 DIAGNOSIS — Z853 Personal history of malignant neoplasm of breast: Secondary | ICD-10-CM

## 2011-03-25 DIAGNOSIS — C50919 Malignant neoplasm of unspecified site of unspecified female breast: Secondary | ICD-10-CM

## 2011-03-25 LAB — COMPREHENSIVE METABOLIC PANEL
ALT: 15 U/L (ref 0–35)
CO2: 27 mEq/L (ref 19–32)
Calcium: 9.7 mg/dL (ref 8.4–10.5)
Chloride: 102 mEq/L (ref 96–112)
Sodium: 139 mEq/L (ref 135–145)
Total Bilirubin: 0.3 mg/dL (ref 0.3–1.2)
Total Protein: 7.4 g/dL (ref 6.0–8.3)

## 2011-03-25 LAB — CBC WITH DIFFERENTIAL/PLATELET
BASO%: 0.2 % (ref 0.0–2.0)
EOS%: 0 % (ref 0.0–7.0)
MCH: 26.4 pg (ref 25.1–34.0)
MCHC: 32.8 g/dL (ref 31.5–36.0)
MONO#: 0.3 10*3/uL (ref 0.1–0.9)
RDW: 14.2 % (ref 11.2–14.5)
WBC: 5.9 10*3/uL (ref 3.9–10.3)
lymph#: 1 10*3/uL (ref 0.9–3.3)
nRBC: 0 % (ref 0–0)

## 2011-03-25 MED ORDER — DEXAMETHASONE SODIUM PHOSPHATE 4 MG/ML IJ SOLN
20.0000 mg | Freq: Once | INTRAMUSCULAR | Status: AC
  Administered 2011-03-25: 20 mg via INTRAVENOUS

## 2011-03-25 MED ORDER — DOCETAXEL CHEMO INJECTION 160 MG/16ML
75.0000 mg/m2 | Freq: Once | INTRAVENOUS | Status: AC
  Administered 2011-03-25: 150 mg via INTRAVENOUS
  Filled 2011-03-25: qty 15

## 2011-03-25 MED ORDER — SODIUM CHLORIDE 0.9 % IJ SOLN
10.0000 mL | INTRAMUSCULAR | Status: DC | PRN
  Filled 2011-03-25: qty 10

## 2011-03-25 MED ORDER — SODIUM CHLORIDE 0.9 % IV SOLN
Freq: Once | INTRAVENOUS | Status: AC
  Administered 2011-03-25: 12:00:00 via INTRAVENOUS

## 2011-03-25 MED ORDER — SODIUM CHLORIDE 0.9 % IV SOLN
600.0000 mg/m2 | Freq: Once | INTRAVENOUS | Status: AC
  Administered 2011-03-25: 1200 mg via INTRAVENOUS
  Filled 2011-03-25: qty 60

## 2011-03-25 MED ORDER — HEPARIN SOD (PORK) LOCK FLUSH 100 UNIT/ML IV SOLN
500.0000 [IU] | Freq: Once | INTRAVENOUS | Status: DC | PRN
  Filled 2011-03-25: qty 5

## 2011-03-25 MED ORDER — ONDANSETRON 16 MG/50ML IVPB (CHCC)
16.0000 mg | Freq: Once | INTRAVENOUS | Status: AC
  Administered 2011-03-25: 16 mg via INTRAVENOUS

## 2011-03-25 NOTE — Progress Notes (Signed)
Pt. Is refusing to start Herceptin today and wishes to begin it with next treatment.  Zollie Scale PA notified.

## 2011-03-25 NOTE — Telephone Encounter (Signed)
Gave patient appointment for  03-2011 thru 04-2011 printed out calendar and gave to the patient

## 2011-03-25 NOTE — Progress Notes (Signed)
Hematology and Oncology Follow Up Visit  [name] [id number] 08-22-1955 55 y.o. 03/25/2011 10:38 AM   Interim History:   Patient returns today for followup of her left breast carcinoma, due for day 1 cycle 2 docetaxel, cyclophosphamide, given with trastuzumab every 3 weeks. Patient was scheduled for her second dose last week, but unfortunately this was delayed due to her father's death last week. Understandably, this has been a very busy and very stressful time for the patient, but she seems to be grieving appropriately and is dealing with things well. Physically, she feels that she tolerated the first cycle of chemotherapy quite well. She did take her antinausea medications appropriately and had only 1 "wave" of nausea, no emesis. She did have some constipation for several days following treatment. She didn't mag citrate and drinks some "dieter's tea" after which she developed some diarrhea. Fortunately this has resolved, and her bowels are back to normal. She had some bony aches and pains following the Neulasta injection which have now resolved.  The patient denies any signs of numbness or tingling in the extremities. Her mouth is a little dry, but she denies any ulcerations or oral sensitivity. No excessive tearing. No skin changes, rashes, no nailbed changes, or nail bed sensitivity. She is ready to proceed with her second dose of chemotherapy today.  A detailed review of systems is otherwise noncontributory as noted below.  Review of Systems: Constitutional:  no weight loss, fever, night sweats and feels well Eyes: negative  WUJ:WJXBJYNW Cardiovascular: no chest pain or dyspnea on exertion Respiratory: no cough, shortness of breath, or wheezing Neurological: negative Dermatological: negative Gastrointestinal: no abdominal pain, change in bowel habits, or black or bloody stools. Previous constipation resolved. Genito-Urinary: no dysuria, trouble voiding, or hematuria Hematological  and Lymphatic: negative Breast: negative Musculoskeletal: negative Remaining ROS negative.  Medications: I have reviewed the patient's current medications.  Allergies: No Known Allergies   Physical Exam:  Blood pressure 158/92, pulse 76, temperature 98.6 F (37 C), height 4' 11.5" (1.511 m), weight 212 lb 9.6 oz (96.435 kg). HEENT:  Sclerae anicteric, conjunctivae pink.  Oropharynx clear.  No mucositis or candidiasis.  Nodes:  No cervical, supraclavicular, or axillary lymphadenopathy palpated.  Breast Exam:  Deferred.  Lungs:  Clear to auscultation bilaterally.  No crackles, rhonchi, or wheezes.  Heart:  Regular rate and rhythm.  Abdomen:  Soft, nontender, obese.  Positive bowel sounds.  No organomegaly or masses palpated.  Musculoskeletal:  No focal spinal tenderness to palpation.  Extremities:  Benign.  No peripheral edema or cyanosis.  Skin:  Benign.  Neuro:  Nonfocal.   Lab Results: Lab Results  Component Value Date   WBC 5.9 03/25/2011   HGB 11.5* 03/25/2011   HCT 35.1 03/25/2011   MCV 80.7 03/25/2011   PLT 317 03/25/2011   NEUTROABS 5.1 05/27/2010     Chemistry      Component Value Date/Time   NA 139 02/08/2011 1600   K 3.5 02/08/2011 1600   CL 101 02/08/2011 1600   CO2 29 02/08/2011 1600   BUN 16 02/08/2011 1600   CREATININE 0.69 02/08/2011 1600      Component Value Date/Time   CALCIUM 9.8 02/08/2011 1600   ALKPHOS 57 12/30/2010 1012   AST 13 12/30/2010 1012   ALT 11 12/30/2010 1012   BILITOT 0.3 12/30/2010 1012         Impression and Plan: 55 year old [place] woman status post lumpectomy and sentinel lymph node sampling in August 2012 for  a 1.1 cm invasive ductal carcinoma, grade 3, with 0 of 2 sentinel lymph nodes involved. T1cN0, stage I. Tumor was ER +98%, PR +13%, HER-2/neu positive with a ratio of 2.3. Receiving adjuvant chemotherapy, due for day 1 cycle 2 of 4 planned q. three-week doses of docetaxel, cyclophosphamide, given with trastuzumab, the trastuzumab been  to be continued for a total of one year. Patient receives Neulasta on day 2 for granulocyte support. The patient will also need radiation therapy, after which she will also start on anti-estrogen therapy.  The patient will proceed to treatment today as scheduled for her second dose of chemotherapy. She has premedicated with oral dexamethasone as instructed, and has all of her antinausea medication on hand at home. We can review how to utilize these appropriately. In fact over half of our 45 minute appointment today was spent reviewing her regimen once again, and discussing what to expect with this second cycle. We did discuss a bowel regimen to include Colace with or without MiraLAX as needed, the goal being to have an easy bowel movement at least every other day. Also suggested trying Claritin for a couple of days following the Neulasta injection for the bony pain.  The patient is scheduled to followup with Dr. Ezzard Standing next week, and we will coordinate a lab appointment for her the same day. She will return in 3 weeks for repeat labs, followup with Dr. Darnelle Catalan, and her third scheduled dose of chemotherapy.  This plan was reviewed with the patient, who voices understanding and agreement.  She knows to call with any changes or problems.    Jesiah Yerby, PA-C 11/15/201210:38 AM

## 2011-03-25 NOTE — Progress Notes (Signed)
This is an addendum to the office progress note on Pay[facility] Bonus, medical record #[id number] following a visit on 03/25/2011.  It was brought to my attention following the patient's office visit today that she did not in fact received trastuzumab with her first cycle of docetaxel and cyclophosphamide. This was reviewed with the patient. She would like to wait until her next cycle in 3 weeks, to proceed with trastuzumab. At that time we'll continue with docetaxel, cyclophosphamide, in addition to the trastuzumab given every 3 weeks. Again the plan is to complete 4 total cycles of docetaxel and cyclophosphamide, to be followed by trastuzumab for a total of one year.  The patient voiced understanding and agreement with this plan.

## 2011-03-26 ENCOUNTER — Ambulatory Visit (HOSPITAL_BASED_OUTPATIENT_CLINIC_OR_DEPARTMENT_OTHER): Payer: BC Managed Care – PPO

## 2011-03-26 VITALS — BP 157/91 | HR 62 | Temp 98.7°F

## 2011-03-26 DIAGNOSIS — C50119 Malignant neoplasm of central portion of unspecified female breast: Secondary | ICD-10-CM

## 2011-03-26 DIAGNOSIS — C50919 Malignant neoplasm of unspecified site of unspecified female breast: Secondary | ICD-10-CM

## 2011-03-26 MED ORDER — PEGFILGRASTIM INJECTION 6 MG/0.6ML
6.0000 mg | Freq: Once | SUBCUTANEOUS | Status: AC
  Administered 2011-03-26: 6 mg via SUBCUTANEOUS
  Filled 2011-03-26: qty 0.6

## 2011-03-29 ENCOUNTER — Telehealth: Payer: Self-pay | Admitting: Oncology

## 2011-03-29 ENCOUNTER — Encounter (INDEPENDENT_AMBULATORY_CARE_PROVIDER_SITE_OTHER): Payer: Self-pay

## 2011-03-29 NOTE — Telephone Encounter (Signed)
called pt lmovm to rtn call to scheduled lab appt for 11/21

## 2011-03-30 ENCOUNTER — Telehealth: Payer: Self-pay | Admitting: Oncology

## 2011-03-30 NOTE — Telephone Encounter (Signed)
pt call lmovm that her lab appt for 11//21 needs to be earlier in the morning.  rtn call lmovm with new time and to call to confirm appt

## 2011-03-31 ENCOUNTER — Encounter (INDEPENDENT_AMBULATORY_CARE_PROVIDER_SITE_OTHER): Payer: BC Managed Care – PPO | Admitting: Surgery

## 2011-03-31 ENCOUNTER — Other Ambulatory Visit (HOSPITAL_BASED_OUTPATIENT_CLINIC_OR_DEPARTMENT_OTHER): Payer: BC Managed Care – PPO

## 2011-03-31 DIAGNOSIS — C50919 Malignant neoplasm of unspecified site of unspecified female breast: Secondary | ICD-10-CM

## 2011-03-31 LAB — CBC WITH DIFFERENTIAL/PLATELET
BASO%: 3.1 % — ABNORMAL HIGH (ref 0.0–2.0)
EOS%: 1.2 % (ref 0.0–7.0)
LYMPH%: 16.6 % (ref 14.0–49.7)
MCH: 26.7 pg (ref 25.1–34.0)
MCHC: 33.3 g/dL (ref 31.5–36.0)
MONO#: 1.5 10*3/uL — ABNORMAL HIGH (ref 0.1–0.9)
Platelets: 254 10*3/uL (ref 145–400)
RBC: 4.15 10*6/uL (ref 3.70–5.45)
WBC: 9.6 10*3/uL (ref 3.9–10.3)
nRBC: 0 % (ref 0–0)

## 2011-04-08 ENCOUNTER — Telehealth: Payer: Self-pay | Admitting: Oncology

## 2011-04-08 ENCOUNTER — Encounter: Payer: Self-pay | Admitting: Physician Assistant

## 2011-04-08 ENCOUNTER — Ambulatory Visit (HOSPITAL_BASED_OUTPATIENT_CLINIC_OR_DEPARTMENT_OTHER): Payer: BC Managed Care – PPO | Admitting: Physician Assistant

## 2011-04-08 ENCOUNTER — Other Ambulatory Visit (HOSPITAL_BASED_OUTPATIENT_CLINIC_OR_DEPARTMENT_OTHER): Payer: BC Managed Care – PPO | Admitting: Lab

## 2011-04-08 ENCOUNTER — Other Ambulatory Visit: Payer: Self-pay | Admitting: *Deleted

## 2011-04-08 ENCOUNTER — Ambulatory Visit: Payer: BC Managed Care – PPO

## 2011-04-08 ENCOUNTER — Other Ambulatory Visit: Payer: Self-pay | Admitting: Oncology

## 2011-04-08 VITALS — BP 146/89 | HR 78 | Temp 98.6°F | Ht 59.5 in | Wt 213.1 lb

## 2011-04-08 DIAGNOSIS — C50119 Malignant neoplasm of central portion of unspecified female breast: Secondary | ICD-10-CM

## 2011-04-08 DIAGNOSIS — K219 Gastro-esophageal reflux disease without esophagitis: Secondary | ICD-10-CM

## 2011-04-08 DIAGNOSIS — C50919 Malignant neoplasm of unspecified site of unspecified female breast: Secondary | ICD-10-CM

## 2011-04-08 DIAGNOSIS — Z170 Estrogen receptor positive status [ER+]: Secondary | ICD-10-CM

## 2011-04-08 DIAGNOSIS — C50319 Malignant neoplasm of lower-inner quadrant of unspecified female breast: Secondary | ICD-10-CM

## 2011-04-08 LAB — CBC WITH DIFFERENTIAL/PLATELET
BASO%: 0.1 % (ref 0.0–2.0)
EOS%: 0.1 % (ref 0.0–7.0)
HGB: 11.3 g/dL — ABNORMAL LOW (ref 11.6–15.9)
MCH: 26.9 pg (ref 25.1–34.0)
MCHC: 33.3 g/dL (ref 31.5–36.0)
MONO%: 3.3 % (ref 0.0–14.0)
RBC: 4.2 10*6/uL (ref 3.70–5.45)
RDW: 14.4 % (ref 11.2–14.5)
lymph#: 1 10*3/uL (ref 0.9–3.3)

## 2011-04-08 NOTE — Telephone Encounter (Signed)
Gv pt appt for [id number] ° °

## 2011-04-08 NOTE — Progress Notes (Signed)
Hematology and Oncology Follow Up Visit  [name] [id number] [birth year] 55 y.o. 04/08/2011 1:18 PM   Interim History:   The patient returns today for followup of her left breast carcinoma. She is currently day 15, cycle 2, of 4 planned q. three-week doses of docetaxel, and cyclophosphamide, and to be given with trastuzumab. She did not receive trastuzumab with the first 2 cycles, but plans to initiate the trastuzumab with her third cycle next week. She receives Neulasta on day 2 for granulocyte support. Unfortunately, due to some errors in scheduling, the patient was scheduled for chemotherapy today, 2 weeks after her last dose. This has been very inconvenient, as the patient had to take off work and bring a friend with her in order to come today, in fact she is not due for chemotherapy. We did spend quite a bit of time today going over her schedule for the next several weeks and making sure it was corrected.  From a physical standpoint, the patient is feeling well. She has had only a couple of brief sensations of tingling in her fingers and toes , but notes that it has been intermittent. It is not affecting any of her day-to-day activities. She's had no mouth ulcers or oral sensitivity. She is eating and drinking well, no nausea and no change in bowel habits. She did have some mild reflux, followed by belching, and actually "regurgitated" her food on 2 occasions. Again, however, she denies frank nausea. She wonders if this is associated with the chemotherapy, or perhaps her lap band. No excessive tearing. No skin changes nailbed changes, or nail bed sensitivity. She does continue to have some fatigue, and certainly this is worse for the first several days following chemotherapy. She also has some bony aches and pains associated with Neulasta injection, which at this point have resolved.  A detailed review of systems is otherwise noncontributory as noted below.  Review of Systems: Constitutional:   no weight loss, fever, night sweats Eyes: negative WUJ:WJXBJYNW Cardiovascular: no chest pain or dyspnea on exertion Respiratory: no cough, shortness of breath, or wheezing Neurological: negative Dermatological: negative Gastrointestinal: no abdominal pain, change in bowel habits, or black or bloody stools Genito-Urinary: no dysuria, trouble voiding, or hematuria Hematological and Lymphatic: negative Breast: negative Musculoskeletal: negative Remaining ROS negative.  Medications: I have reviewed the patient's current medications.  Allergies: No Known Allergies   Physical Exam:  Blood pressure 146/89, pulse 78, temperature 98.6 F (37 C), temperature source Oral, height 4' 11.5" (1.511 m), weight 213 lb 1.6 oz (96.662 kg). HEENT:  Sclerae anicteric, conjunctivae pink.  Oropharynx clear.  No mucositis or candidiasis.  Nodes:  No cervical, supraclavicular, or axillary lymphadenopathy palpated.  Breast Exam:  Deferred. Port intact in the upper right chest wall. It is  Lungs:  Clear to auscultation bilaterally.  No crackles, rhonchi, or wheezes.  Heart:  Regular rate and rhythm.  Abdomen:  Soft, nontender.  Positive bowel sounds.  No organomegaly or masses palpated.  Musculoskeletal:  No focal spinal tenderness to palpation.  Extremities:  Benign.  No peripheral edema or cyanosis.  Skin:  Benign.  Neuro:  Nonfocal.   Lab Results: Lab Results  Component Value Date   WBC 10.4* 04/08/2011   HGB 11.3* 04/08/2011   HCT 33.9* 04/08/2011   MCV 80.7 04/08/2011   PLT 191 04/08/2011   NEUTROABS 9.1* 04/08/2011     Chemistry      Component Value Date/Time   NA 139 03/25/2011 0850  K 3.4* 03/25/2011 0850   CL 102 03/25/2011 0850   CO2 27 03/25/2011 0850   BUN 12 03/25/2011 0850   CREATININE 0.66 03/25/2011 0850      Component Value Date/Time   CALCIUM 9.7 03/25/2011 0850   ALKPHOS 61 03/25/2011 0850   AST 14 03/25/2011 0850   ALT 15 03/25/2011 0850   BILITOT 0.3 03/25/2011 0850            Impression and Plan: 55 year old [place] woman status post lumpectomy and sentinel lymph node sampling in August 2012 for a 1.1 cm invasive ductal carcinoma, grade 3, with 0 of 2 sentinel lymph nodes involved. T1cN0, stage I. Tumor was ER +98%, PR +13%, HER-2/neu positive with a ratio of 2.3. Receiving adjuvant chemotherapy, due for day 1 cycle 2 of 4 planned q. three-week doses of docetaxel, cyclophosphamide, to be given with trastuzumab beginning with cycle 3. The trastuzumab will be in continued for a total of one year. Patient receives Neulasta on day 2 for granulocyte support. The patient will also need radiation therapy, after which she will also start on anti-estrogen therapy.  The patient will return next week for repeat labs and her third cycle of docetaxel and cyclophosphamide. We will initiate her first dose of trastuzumab on the same day. She'll have Neulasta injection on day 2, and I'll see her the following week on December 14 for assessment chemotoxicity. We will follow her very closely for any increased signs of peripheral neuropathy.  With regards to the reflux, I suggested trying omeprazole. The patient would like to try some gasX first to see if she gets any relief, and if not we'll likely call for a prescription for the omeprazole. This would be taken at a dose of 20 mg by mouth twice a day.  This plan was reviewed with the patient, who voices understanding and agreement.  She knows to call with any changes or problems.    Dimitris Shanahan, PA-C 11/29/20121:18 PM

## 2011-04-09 ENCOUNTER — Ambulatory Visit: Payer: BC Managed Care – PPO

## 2011-04-15 ENCOUNTER — Other Ambulatory Visit: Payer: BC Managed Care – PPO | Admitting: Lab

## 2011-04-15 ENCOUNTER — Ambulatory Visit: Payer: BC Managed Care – PPO | Admitting: Oncology

## 2011-04-15 ENCOUNTER — Ambulatory Visit (HOSPITAL_BASED_OUTPATIENT_CLINIC_OR_DEPARTMENT_OTHER): Payer: BC Managed Care – PPO

## 2011-04-15 ENCOUNTER — Other Ambulatory Visit (HOSPITAL_BASED_OUTPATIENT_CLINIC_OR_DEPARTMENT_OTHER): Payer: BC Managed Care – PPO | Admitting: Lab

## 2011-04-15 VITALS — BP 147/90 | HR 84

## 2011-04-15 DIAGNOSIS — Z853 Personal history of malignant neoplasm of breast: Secondary | ICD-10-CM

## 2011-04-15 DIAGNOSIS — C50119 Malignant neoplasm of central portion of unspecified female breast: Secondary | ICD-10-CM

## 2011-04-15 DIAGNOSIS — Z5112 Encounter for antineoplastic immunotherapy: Secondary | ICD-10-CM

## 2011-04-15 DIAGNOSIS — C50919 Malignant neoplasm of unspecified site of unspecified female breast: Secondary | ICD-10-CM

## 2011-04-15 DIAGNOSIS — Z5111 Encounter for antineoplastic chemotherapy: Secondary | ICD-10-CM

## 2011-04-15 LAB — CBC WITH DIFFERENTIAL/PLATELET
Basophils Absolute: 0 10*3/uL (ref 0.0–0.1)
Eosinophils Absolute: 0 10*3/uL (ref 0.0–0.5)
HGB: 11 g/dL — ABNORMAL LOW (ref 11.6–15.9)
NEUT#: 3.7 10*3/uL (ref 1.5–6.5)
RBC: 4.15 10*6/uL (ref 3.70–5.45)
RDW: 14.8 % — ABNORMAL HIGH (ref 11.2–14.5)
WBC: 4.9 10*3/uL (ref 3.9–10.3)
lymph#: 0.9 10*3/uL (ref 0.9–3.3)
nRBC: 0 % (ref 0–0)

## 2011-04-15 LAB — COMPREHENSIVE METABOLIC PANEL
ALT: 13 U/L (ref 0–35)
Albumin: 3.7 g/dL (ref 3.5–5.2)
Alkaline Phosphatase: 63 U/L (ref 39–117)
CO2: 25 mEq/L (ref 19–32)
Glucose, Bld: 145 mg/dL — ABNORMAL HIGH (ref 70–99)
Potassium: 3.5 mEq/L (ref 3.5–5.3)
Sodium: 138 mEq/L (ref 135–145)
Total Bilirubin: 0.3 mg/dL (ref 0.3–1.2)
Total Protein: 7.3 g/dL (ref 6.0–8.3)

## 2011-04-15 MED ORDER — SODIUM CHLORIDE 0.9 % IV SOLN
600.0000 mg/m2 | Freq: Once | INTRAVENOUS | Status: AC
  Administered 2011-04-15: 1200 mg via INTRAVENOUS
  Filled 2011-04-15: qty 60

## 2011-04-15 MED ORDER — SODIUM CHLORIDE 0.9 % IV SOLN
Freq: Once | INTRAVENOUS | Status: AC
  Administered 2011-04-15: 11:00:00 via INTRAVENOUS

## 2011-04-15 MED ORDER — HEPARIN SOD (PORK) LOCK FLUSH 100 UNIT/ML IV SOLN
500.0000 [IU] | Freq: Once | INTRAVENOUS | Status: DC | PRN
  Filled 2011-04-15: qty 5

## 2011-04-15 MED ORDER — SODIUM CHLORIDE 0.9 % IJ SOLN
10.0000 mL | INTRAMUSCULAR | Status: DC | PRN
  Filled 2011-04-15: qty 10

## 2011-04-15 MED ORDER — DOCETAXEL CHEMO INJECTION 160 MG/16ML
75.0000 mg/m2 | Freq: Once | INTRAVENOUS | Status: AC
  Administered 2011-04-15: 150 mg via INTRAVENOUS
  Filled 2011-04-15: qty 15

## 2011-04-15 MED ORDER — DEXAMETHASONE SODIUM PHOSPHATE 4 MG/ML IJ SOLN
20.0000 mg | Freq: Once | INTRAMUSCULAR | Status: AC
  Administered 2011-04-15: 20 mg via INTRAVENOUS

## 2011-04-15 MED ORDER — ONDANSETRON 16 MG/50ML IVPB (CHCC)
16.0000 mg | Freq: Once | INTRAVENOUS | Status: AC
  Administered 2011-04-15: 16 mg via INTRAVENOUS

## 2011-04-15 MED ORDER — ACETAMINOPHEN 325 MG PO TABS
650.0000 mg | ORAL_TABLET | Freq: Once | ORAL | Status: AC
  Administered 2011-04-15: 650 mg via ORAL

## 2011-04-15 MED ORDER — TRASTUZUMAB CHEMO INJECTION 440 MG
6.0000 mg/kg | Freq: Once | INTRAVENOUS | Status: AC
  Administered 2011-04-15: 588 mg via INTRAVENOUS
  Filled 2011-04-15: qty 28

## 2011-04-15 MED ORDER — DIPHENHYDRAMINE HCL 25 MG PO CAPS
25.0000 mg | ORAL_CAPSULE | Freq: Once | ORAL | Status: AC
  Administered 2011-04-15: 25 mg via ORAL

## 2011-04-16 ENCOUNTER — Ambulatory Visit (HOSPITAL_BASED_OUTPATIENT_CLINIC_OR_DEPARTMENT_OTHER): Payer: BC Managed Care – PPO

## 2011-04-16 ENCOUNTER — Ambulatory Visit: Payer: BC Managed Care – PPO

## 2011-04-16 VITALS — BP 135/86 | HR 82 | Temp 99.4°F

## 2011-04-16 DIAGNOSIS — Z170 Estrogen receptor positive status [ER+]: Secondary | ICD-10-CM

## 2011-04-16 DIAGNOSIS — Z5189 Encounter for other specified aftercare: Secondary | ICD-10-CM

## 2011-04-16 DIAGNOSIS — Z853 Personal history of malignant neoplasm of breast: Secondary | ICD-10-CM

## 2011-04-16 DIAGNOSIS — C50119 Malignant neoplasm of central portion of unspecified female breast: Secondary | ICD-10-CM

## 2011-04-16 MED ORDER — PEGFILGRASTIM INJECTION 6 MG/0.6ML
6.0000 mg | Freq: Once | SUBCUTANEOUS | Status: AC
  Administered 2011-04-16: 6 mg via SUBCUTANEOUS
  Filled 2011-04-16: qty 0.6

## 2011-04-19 ENCOUNTER — Telehealth: Payer: Self-pay | Admitting: Oncology

## 2011-04-19 NOTE — Telephone Encounter (Signed)
Patient call today,i tried calling her back on the cell phone,so I left a message for her to call me.

## 2011-04-23 ENCOUNTER — Ambulatory Visit: Payer: BC Managed Care – PPO | Admitting: Physician Assistant

## 2011-04-23 ENCOUNTER — Other Ambulatory Visit: Payer: BC Managed Care – PPO | Admitting: Lab

## 2011-04-23 NOTE — Progress Notes (Signed)
[facility] on 04/23/11 for labs and follow up visit, day 8 cycle 3 of adjuvant chemo.  Request was sent to scheduling to reschedule lab only for next week, either 12/17 or 12/18.

## 2011-04-24 ENCOUNTER — Telehealth: Payer: Self-pay | Admitting: Oncology

## 2011-04-24 NOTE — Telephone Encounter (Signed)
per amy berry, called pt and scheduled a lab for 12/19.  pt could not come on 12/17 or 12/18

## 2011-04-28 ENCOUNTER — Other Ambulatory Visit (HOSPITAL_BASED_OUTPATIENT_CLINIC_OR_DEPARTMENT_OTHER): Payer: BC Managed Care – PPO | Admitting: Lab

## 2011-04-28 ENCOUNTER — Other Ambulatory Visit: Payer: Self-pay | Admitting: Oncology

## 2011-04-28 DIAGNOSIS — C50319 Malignant neoplasm of lower-inner quadrant of unspecified female breast: Secondary | ICD-10-CM

## 2011-04-28 DIAGNOSIS — C50119 Malignant neoplasm of central portion of unspecified female breast: Secondary | ICD-10-CM

## 2011-04-28 LAB — CBC WITH DIFFERENTIAL/PLATELET
BASO%: 0.5 % (ref 0.0–2.0)
EOS%: 0.2 % (ref 0.0–7.0)
MCHC: 32.7 g/dL (ref 31.5–36.0)
MONO#: 0.7 10*3/uL (ref 0.1–0.9)
RBC: 4.11 10*6/uL (ref 3.70–5.45)
WBC: 8.4 10*3/uL (ref 3.9–10.3)
lymph#: 1.8 10*3/uL (ref 0.9–3.3)
nRBC: 0 % (ref 0–0)

## 2011-05-06 ENCOUNTER — Other Ambulatory Visit: Payer: Self-pay | Admitting: Oncology

## 2011-05-06 ENCOUNTER — Encounter: Payer: Self-pay | Admitting: Physician Assistant

## 2011-05-06 ENCOUNTER — Other Ambulatory Visit: Payer: BC Managed Care – PPO | Admitting: Lab

## 2011-05-06 ENCOUNTER — Other Ambulatory Visit (HOSPITAL_BASED_OUTPATIENT_CLINIC_OR_DEPARTMENT_OTHER): Payer: BC Managed Care – PPO | Admitting: Lab

## 2011-05-06 ENCOUNTER — Ambulatory Visit (HOSPITAL_BASED_OUTPATIENT_CLINIC_OR_DEPARTMENT_OTHER): Payer: BC Managed Care – PPO | Admitting: Physician Assistant

## 2011-05-06 ENCOUNTER — Ambulatory Visit (HOSPITAL_BASED_OUTPATIENT_CLINIC_OR_DEPARTMENT_OTHER): Payer: BC Managed Care – PPO

## 2011-05-06 ENCOUNTER — Other Ambulatory Visit: Payer: Self-pay | Admitting: *Deleted

## 2011-05-06 VITALS — BP 161/95 | HR 101 | Temp 99.1°F

## 2011-05-06 DIAGNOSIS — Z5112 Encounter for antineoplastic immunotherapy: Secondary | ICD-10-CM

## 2011-05-06 DIAGNOSIS — Z170 Estrogen receptor positive status [ER+]: Secondary | ICD-10-CM

## 2011-05-06 DIAGNOSIS — C50319 Malignant neoplasm of lower-inner quadrant of unspecified female breast: Secondary | ICD-10-CM

## 2011-05-06 DIAGNOSIS — Z853 Personal history of malignant neoplasm of breast: Secondary | ICD-10-CM

## 2011-05-06 DIAGNOSIS — C50119 Malignant neoplasm of central portion of unspecified female breast: Secondary | ICD-10-CM

## 2011-05-06 DIAGNOSIS — Z79899 Other long term (current) drug therapy: Secondary | ICD-10-CM

## 2011-05-06 DIAGNOSIS — C50919 Malignant neoplasm of unspecified site of unspecified female breast: Secondary | ICD-10-CM

## 2011-05-06 LAB — CBC WITH DIFFERENTIAL/PLATELET
Basophils Absolute: 0 10*3/uL (ref 0.0–0.1)
EOS%: 0 % (ref 0.0–7.0)
Eosinophils Absolute: 0 10*3/uL (ref 0.0–0.5)
HGB: 10.5 g/dL — ABNORMAL LOW (ref 11.6–15.9)
MCH: 26.6 pg (ref 25.1–34.0)
NEUT#: 8.2 10*3/uL — ABNORMAL HIGH (ref 1.5–6.5)
RBC: 3.94 10*6/uL (ref 3.70–5.45)
RDW: 15 % — ABNORMAL HIGH (ref 11.2–14.5)
lymph#: 1 10*3/uL (ref 0.9–3.3)

## 2011-05-06 LAB — COMPREHENSIVE METABOLIC PANEL
Albumin: 4.2 g/dL (ref 3.5–5.2)
Alkaline Phosphatase: 57 U/L (ref 39–117)
BUN: 16 mg/dL (ref 6–23)
Creatinine, Ser: 0.7 mg/dL (ref 0.50–1.10)
Glucose, Bld: 152 mg/dL — ABNORMAL HIGH (ref 70–99)
Total Bilirubin: 0.3 mg/dL (ref 0.3–1.2)

## 2011-05-06 MED ORDER — ACETAMINOPHEN 325 MG PO TABS
650.0000 mg | ORAL_TABLET | Freq: Once | ORAL | Status: AC
  Administered 2011-05-06: 650 mg via ORAL

## 2011-05-06 MED ORDER — SODIUM CHLORIDE 0.9 % IV SOLN: Freq: Once | INTRAVENOUS | Status: DC

## 2011-05-06 MED ORDER — DIPHENHYDRAMINE HCL 25 MG PO CAPS
25.0000 mg | ORAL_CAPSULE | Freq: Once | ORAL | Status: AC
Start: 2011-05-06 — End: 2011-05-06
  Administered 2011-05-06: 25 mg via ORAL

## 2011-05-06 MED ORDER — HEPARIN SOD (PORK) LOCK FLUSH 100 UNIT/ML IV SOLN
500.0000 [IU] | Freq: Once | INTRAVENOUS | Status: AC | PRN
  Administered 2011-05-06: 500 [IU]
  Filled 2011-05-06: qty 5

## 2011-05-06 MED ORDER — DEXAMETHASONE SODIUM PHOSPHATE 4 MG/ML IJ SOLN
20.0000 mg | Freq: Once | INTRAMUSCULAR | Status: AC
  Administered 2011-05-06: 20 mg via INTRAVENOUS

## 2011-05-06 MED ORDER — SODIUM CHLORIDE 0.9 % IV SOLN
Freq: Once | INTRAVENOUS | Status: AC
  Administered 2011-05-06: 14:00:00 via INTRAVENOUS

## 2011-05-06 MED ORDER — SODIUM CHLORIDE 0.9 % IV SOLN
600.0000 mg/m2 | Freq: Once | INTRAVENOUS | Status: AC
  Administered 2011-05-06: 1200 mg via INTRAVENOUS
  Filled 2011-05-06: qty 60

## 2011-05-06 MED ORDER — DOCETAXEL CHEMO INJECTION 160 MG/16ML
75.0000 mg/m2 | Freq: Once | INTRAVENOUS | Status: AC
  Administered 2011-05-06: 150 mg via INTRAVENOUS
  Filled 2011-05-06: qty 15

## 2011-05-06 MED ORDER — SODIUM CHLORIDE 0.9 % IJ SOLN
10.0000 mL | INTRAMUSCULAR | Status: DC | PRN
  Administered 2011-05-06: 10 mL
  Filled 2011-05-06: qty 10

## 2011-05-06 MED ORDER — ONDANSETRON 16 MG/50ML IVPB (CHCC)
16.0000 mg | Freq: Once | INTRAVENOUS | Status: AC
  Administered 2011-05-06: 16 mg via INTRAVENOUS

## 2011-05-06 MED ORDER — TRASTUZUMAB CHEMO INJECTION 440 MG
6.0000 mg/kg | Freq: Once | INTRAVENOUS | Status: AC
  Administered 2011-05-06: 588 mg via INTRAVENOUS
  Filled 2011-05-06: qty 28

## 2011-05-06 NOTE — Progress Notes (Signed)
Hematology and Oncology Follow Up Visit  [name] [id number] Jun 13, 1955 55 y.o. 05/06/2011    HPI: [name] is a 55 year old [place] woman referred by Dr. Clelia Croft for treatment of left breast cancer.   The patient underwent screening bilateral mammography July 04, 2009, at [facility] and this showed only a scattered fibroglandular densities.  However, repeat screening exam Sep 29, 2010 showed a potential abnormality in the left breast, retroareolar region.  The patient was recalled for additional views Oct 08, 2010 and Dr. Derinda Late was able to locate the nodular density noted on the screening exam.  It measured approximately 8 mm.  Ultrasound showed a second nodular density just lateral to the nipple areolar complex.  In that area, there was what appears to be either a small cluster of cysts or perhaps a solid nodule.  In particular, the nodule seen in the 3 o'clock position was felt to be most likely a fibroadenoma.  The other area, however, required biopsy and this was performed October 26, 2010.  The pathology from this procedure ([id number]) showed a high-grade invasive ductal carcinoma which was estrogen-receptor positive at 98%, progesterone-receptor positive at 13% with an elevated MIB-1 at 69% and no evidence of HER-2 amplification, the ratio by CISH being 1.32.   With this information the patient was referred for bilateral breast MRIs and these were performed October 30, 2010.  In the left breast there was a 1.8 cm ill-defined area in the central breast associated with post biopsy changes.  There were really no other suspicious areas in either breast and no bony abnormalities and no abnormal appearing or enlarged lymph nodes.    Patient underwent left lumpectomy and sentinel lymph node sampling December 10, 2010 ([id number]) for a 1.1-cm invasive ductal carcinoma, grade 3, with 0/2 sentinel lymph nodes involved and so T1c N0 (stage I); the tumor being estrogen receptor 98% and progesterone  receptor 13% positive, with no HER-2 amplification (by initial determination, but amplified on repeat with a ratio of 2.30); with an Oncotype DX score of 43 predicting a 29% risk of recurrence if she takes 5 years of tamoxifen.  (Her-2 was negative on the Oncotype DX determination, which uses a completely different method).   Decision was made to proceed with adjuvant chemotherapy, specifically 4 cycles of q. three-week docetaxel and cyclophosphamide given along with trastuzumab. Chemotherapy was started on 02/25/2011. Patient received docetaxel and cyclophosphamide alone with cycles 1 and 2, and begin Q. three-week trastuzumab with cycle 3. The plan is to proceed with radiation therapy after completion of these first 4 cycles, then continue trastuzumab for total of one year in addition to her anti-estrogen therapy.   Interim History:   The patient evaluated in the chemotherapy room today while receiving day 1 cycle 4 of 4 planned q. three-week doses of docetaxel and cyclophosphamide given along with trastuzumab. She receives Neulasta on day 2 for granulocyte support.  Patient is feeling well today with the exception of a sore throat. Her throat is a little sore now for a couple of days, but she denies any fevers, chills, or night sweats. No oral ulcerations. No problems swallowing. No cough, phlegm production, or shortness of breath. She has a little bit of sinus congestion.  The patient also had a "stomach virus" last week with vomiting and diarrhea. This is completely resolved, and she is able to eat and drink with no problems.  Patient does note for the first time, some tingling and numbness in her right foot. This  affects a very specific area in the ball of her right foot. It comes and goes. It is not affecting her walking or her daily activities. She has noted no numbness or tingling whatsoever in the left foot or in the upper extremities.  Otherwise, the patient has had no significant skin  changes, rashes, or nail bed changes. She had slight gingival bleeding this past week which resolved quickly and easily. She's had no additional signs of abnormal bleeding elsewhere. No excessive tearing. Her energy level is fair.  A detailed review of systems is otherwise noncontributory as noted below.  Review of Systems: Constitutional:  no weight loss, fever, night sweats and feels well Eyes: negative WUJ:WJXB throat Cardiovascular: no chest pain or dyspnea on exertion Respiratory: no cough, shortness of breath, or wheezing Neurological: positive for - numbness/tingling right foot Dermatological: negative Gastrointestinal: no abdominal pain, change in bowel habits, or black or bloody stools, no nausea Genito-Urinary: no dysuria, trouble voiding, or hematuria Hematological and Lymphatic: positive for - brief gingival bleeding Breast: negative Musculoskeletal: negative Remaining ROS negative.  PAST MEDICAL HISTORY:  Significant for morbid obesity.  The patient being status post lap band surgery under Dr. Johna Sheriff, January 2012.  She has borderline diabetes, high blood pressure, a 20 pack-year smoking history.  The patient quitting in 2000, mild hypercholesterolemia and removal of a submucous myoma from the posterior left uterine wall in April 2001.  FAMILY HISTORY:  The patient's mother is  48 years old.  The patient's father died in his 23s from cirrhosis.  The patient has 1 sister and 1 brother.  There is no breast or ovarian cancer in the immediate family.  There is a maternal uncle with prostate cancer diagnosed in his 16s.  GYNECOLOGIC HISTORY:  She had menarche at age 74, last menstrual period was late May 2012.  Her periods however, are not regular and it had been almost a year before that.  She has never used hormone replacement.  She is GX P0.  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  She attends a [employer].  She goes to the gym at  least three times a week.   Medications:   I have reviewed the patient's current medications.  Current Outpatient Prescriptions  Medication Sig Dispense Refill  . AZOR 5-40 MG per tablet       . b complex vitamins tablet Take 1 tablet by mouth daily.        . Calcium Citrate-Vitamin D (CALCET CREAMY BITES PO) Take by mouth daily.        [name] dexamethasone (DECADRON) 4 MG tablet       . hydrochlorothiazide 25 MG tablet Take 25 mg by mouth daily.        [name] lidocaine-prilocaine (EMLA) cream       . ondansetron (ZOFRAN) 8 MG tablet Take 8 mg by mouth every 12 (twelve) hours as needed.        . prochlorperazine (COMPAZINE) 10 MG tablet       . cephALEXin (KEFLEX) 500 MG capsule       . multivitamin-iron-minerals-folic acid (CENTRUM) chewable tablet Chew 1 tablet by mouth daily.         No current facility-administered medications for this visit.   Facility-Administered Medications Ordered in Other Visits  Medication Dose Route Frequency Provider Last Rate Last Dose  . 0.9 %  sodium chloride infusion   Intravenous Once Zollie Scale, PA      .  0.9 %  sodium chloride infusion   Intravenous Once Zollie Scale, PA      . acetaminophen (TYLENOL) tablet 650 mg  650 mg Oral Once Zollie Scale, PA   650 mg at 05/06/11 1412  . cyclophosphamide (CYTOXAN) 1,200 mg in sodium chloride 0.9 % 250 mL chemo infusion  600 mg/m2 (Treatment Plan Actual) Intravenous Once Sanaii Caporaso, PA      . dexamethasone (DECADRON) injection 20 mg  20 mg Intravenous Once Zaiden Ludlum, PA   20 mg at 05/06/11 1415  . diphenhydrAMINE (BENADRYL) capsule 25 mg  25 mg Oral Once Lowella Dell, MD   25 mg at 05/06/11 1412  . DOCEtaxel (TAXOTERE) 150 mg in dextrose 5 % 250 mL chemo infusion  75 mg/m2 (Treatment Plan Actual) Intravenous Once Caylen Yardley, PA      . heparin lock flush 100 unit/mL  500 Units Intracatheter Once PRN Zollie Scale, PA      . ondansetron (ZOFRAN) IVPB 16 mg  16 mg Intravenous Once Alon Mazor, PA   16 mg at 05/06/11 1415  . sodium  chloride 0.9 % injection 10 mL  10 mL Intracatheter PRN Ori Trejos, PA      . trastuzumab (HERCEPTIN) 588 mg in sodium chloride 0.9 % 250 mL chemo infusion  6 mg/kg (Treatment Plan Actual) Intravenous Once Zollie Scale, PA        Allergies: No Known Allergies  Physical Exam: There were no vitals filed for this visit. HEENT:  Sclerae anicteric, conjunctivae pink.  Oropharynx clear.  No mucositis or candidiasis.  No pharyngeal erythema or exudate. Breast Exam:  Deferred  Lungs:  Clear to auscultation bilaterally.  No crackles, rhonchi, or wheezes.   Heart:  Regular rate and rhythm.   Abdomen:  Soft, nontender.  Positive bowel sounds.  Extremities:  Benign.  No peripheral edema or cyanosis.   Skin:  Benign.   Neuro:  Nonfocal.   Lab Results: Lab Results  Component Value Date   WBC 9.4 05/06/2011   HGB 10.5* 05/06/2011   HCT 31.8* 05/06/2011   MCV 80.7 05/06/2011   PLT 329 05/06/2011   NEUTROABS 8.2* 05/06/2011     Chemistry      Component Value Date/Time   NA 138 04/15/2011 0949   K 3.5 04/15/2011 0949   CL 103 04/15/2011 0949   CO2 25 04/15/2011 0949   BUN 13 04/15/2011 0949   CREATININE 0.67 04/15/2011 0949      Component Value Date/Time   CALCIUM 9.3 04/15/2011 0949   ALKPHOS 63 04/15/2011 0949   AST 12 04/15/2011 0949   ALT 13 04/15/2011 0949   BILITOT 0.3 04/15/2011 0949        Radiological Studies:  No results found.   Assessment:  55 year old [place] woman   1.  Status post lumpectomy and sentinel lymph node sampling in August 2012 for a 1.1 cm invasive ductal carcinoma, grade 3, with 0 of 2 sentinel lymph nodes involved. T1cN0, stage I. Tumor was ER +98%, PR +13%, HER-2/neu positive with a ratio of 2.3.  2.  Receiving adjuvant chemotherapy, 4 planned q. three-week doses of docetaxel, cyclophosphamide, given with trastuzumab beginning with cycle 3. Patient receives Neulasta on day 2 for granulocyte support.   3. The trastuzumab will be in continued for a total of  one year.  4.  The patient will also need radiation therapy, after which she will also start on anti-estrogen therapy.   Plan:  The patient receive her fourth and  final dose of docetaxel and cyclophosphamide today along with trastuzumab. She'll return tomorrow for Neulasta injection and will see her next week on January 3 for assessment of chemotoxicity. As noted above, she has noted some intermittent numbness in the ball of her right foot and I have suggested she try taking vitamin B complex. We will proceed with treatment today since this appears to be mild and intermittent.  Your setting the patient to meet with Dr. Dayton Scrape to discuss radiation therapy. She'll continue to return on a Q. three-week basis for trastuzumab, and will see Dr. Darnelle Catalan in early February to discuss her upcoming oral anti-estrogen therapy. I will mention that the patient request Friday appointments in the future for trastuzumab treatments.   This plan was reviewed with the patient, who voices understanding and agreement.  She knows to call with any changes or problems.    Kc Sedlak, PA-C 05/06/2011

## 2011-05-07 ENCOUNTER — Other Ambulatory Visit: Payer: Self-pay | Admitting: Certified Registered Nurse Anesthetist

## 2011-05-07 ENCOUNTER — Ambulatory Visit (HOSPITAL_BASED_OUTPATIENT_CLINIC_OR_DEPARTMENT_OTHER): Payer: BC Managed Care – PPO

## 2011-05-07 VITALS — BP 156/94 | HR 74 | Temp 98.8°F

## 2011-05-07 DIAGNOSIS — Z5189 Encounter for other specified aftercare: Secondary | ICD-10-CM

## 2011-05-07 DIAGNOSIS — C50119 Malignant neoplasm of central portion of unspecified female breast: Secondary | ICD-10-CM

## 2011-05-07 DIAGNOSIS — Z853 Personal history of malignant neoplasm of breast: Secondary | ICD-10-CM

## 2011-05-07 MED ORDER — PEGFILGRASTIM INJECTION 6 MG/0.6ML
6.0000 mg | Freq: Once | SUBCUTANEOUS | Status: AC
  Administered 2011-05-07: 6 mg via SUBCUTANEOUS
  Filled 2011-05-07: qty 0.6

## 2011-05-12 ENCOUNTER — Telehealth: Payer: Self-pay | Admitting: Oncology

## 2011-05-12 NOTE — Telephone Encounter (Signed)
pt called and r/s appt time on 01/03 to 8:45am

## 2011-05-13 ENCOUNTER — Encounter: Payer: Self-pay | Admitting: Physician Assistant

## 2011-05-13 ENCOUNTER — Ambulatory Visit (HOSPITAL_BASED_OUTPATIENT_CLINIC_OR_DEPARTMENT_OTHER): Payer: BC Managed Care – PPO | Admitting: Physician Assistant

## 2011-05-13 ENCOUNTER — Ambulatory Visit: Payer: BC Managed Care – PPO | Admitting: Physician Assistant

## 2011-05-13 ENCOUNTER — Other Ambulatory Visit: Payer: BC Managed Care – PPO

## 2011-05-13 ENCOUNTER — Other Ambulatory Visit (HOSPITAL_BASED_OUTPATIENT_CLINIC_OR_DEPARTMENT_OTHER): Payer: BC Managed Care – PPO | Admitting: Lab

## 2011-05-13 VITALS — BP 110/75 | HR 96 | Temp 99.0°F | Ht 59.5 in | Wt 211.6 lb

## 2011-05-13 DIAGNOSIS — C50919 Malignant neoplasm of unspecified site of unspecified female breast: Secondary | ICD-10-CM

## 2011-05-13 DIAGNOSIS — Z09 Encounter for follow-up examination after completed treatment for conditions other than malignant neoplasm: Secondary | ICD-10-CM

## 2011-05-13 DIAGNOSIS — Z170 Estrogen receptor positive status [ER+]: Secondary | ICD-10-CM

## 2011-05-13 LAB — CBC WITH DIFFERENTIAL/PLATELET
Basophils Absolute: 0 10*3/uL (ref 0.0–0.1)
Eosinophils Absolute: 0 10*3/uL (ref 0.0–0.5)
HCT: 32.4 % — ABNORMAL LOW (ref 34.8–46.6)
HGB: 10.1 g/dL — ABNORMAL LOW (ref 11.6–15.9)
MCV: 83.3 fL (ref 79.5–101.0)
MONO%: 11.9 % (ref 0.0–14.0)
NEUT#: 5.4 10*3/uL (ref 1.5–6.5)
RDW: 15.7 % — ABNORMAL HIGH (ref 11.2–14.5)

## 2011-05-13 NOTE — Progress Notes (Signed)
Hematology and Oncology Follow Up Visit  [name] [id number] Dec 17, 1955 56 y.o. 05/13/2011    HPI: [name] is a 56 year old [place] woman referred by Dr. Clelia Croft for treatment of left breast cancer.   The patient underwent screening bilateral mammography July 04, 2009, at [facility] and this showed only a scattered fibroglandular densities.  However, repeat screening exam Sep 29, 2010 showed a potential abnormality in the left breast, retroareolar region.  The patient was recalled for additional views Oct 08, 2010 and Dr. Derinda Late was able to locate the nodular density noted on the screening exam.  It measured approximately 8 mm.  Ultrasound showed a second nodular density just lateral to the nipple areolar complex.  In that area, there was what appears to be either a small cluster of cysts or perhaps a solid nodule.  In particular, the nodule seen in the 3 o'clock position was felt to be most likely a fibroadenoma.  The other area, however, required biopsy and this was performed October 26, 2010.  The pathology from this procedure ([id number]) showed a high-grade invasive ductal carcinoma which was estrogen-receptor positive at 98%, progesterone-receptor positive at 13% with an elevated MIB-1 at 69% and no evidence of HER-2 amplification, the ratio by CISH being 1.32.   With this information the patient was referred for bilateral breast MRIs and these were performed October 30, 2010.  In the left breast there was a 1.8 cm ill-defined area in the central breast associated with post biopsy changes.  There were really no other suspicious areas in either breast and no bony abnormalities and no abnormal appearing or enlarged lymph nodes.    Patient underwent left lumpectomy and sentinel lymph node sampling December 10, 2010 ([id number]) for a 1.1-cm invasive ductal carcinoma, grade 3, with 0/2 sentinel lymph nodes involved and so T1c N0 (stage I); the tumor being estrogen receptor 98% and progesterone receptor  13% positive, with no HER-2 amplification (by initial determination, but amplified on repeat with a ratio of 2.30); with an Oncotype DX score of 43 predicting a 29% risk of recurrence if she takes 5 years of tamoxifen.  (Her-2 was negative on the Oncotype DX determination, which uses a completely different method).   Decision was made to proceed with adjuvant chemotherapy, specifically 4 cycles of q. three-week docetaxel and cyclophosphamide given along with trastuzumab. Chemotherapy was started on 02/25/2011. Patient received docetaxel and cyclophosphamide alone with cycles 1 and 2, and begin Q. three-week trastuzumab with cycle 3. The plan is to proceed with radiation therapy after completion of these first 4 cycles, then continue trastuzumab for total of one year in addition to her anti-estrogen therapy.   Interim History:   Avree returns today for assessment chemotoxicity on day 8 cycle 4 of 4 planned q. three-week doses of docetaxel/cyclophosphamide/trastuzumab. She received Neulasta on day 2 for granulocyte support. She is thrilled to have completed chemotherapy.  Interim history is remarkable for the fact that [name]'s mother who is in her 67s was just diagnosed with breast cancer herself. She's looking at having a mastectomy, apparently, and Percy is extremely concerned.   Physically, however, [name] is very tired. She recently had a stomach virus that "wiped her out" . She still having some loose stools. She's had no blood or mucus in the stool. She denies any fevers, chills, or night sweats. She does have some abdominal cramping associated with bowel movements.  Keli also continues to have some tingling in her toes bilaterally but tells me this comes  and goes. It is mild, not affecting any of her day-to-day activities. She's had no signs of neuropathy in the upper extremities. No mouth ulcers or oral sensitivity. No excessive tearing. No skin changes. Her nail beds are little hyperpigmented,  slightly tender, but no significant changes otherwise.  A detailed review of systems is otherwise noncontributory as noted below.  Review of Systems: Constitutional:  no weight loss, fever, night sweats and feels well Eyes: negative ENT: negative Cardiovascular: no chest pain or dyspnea on exertion Respiratory: no cough, shortness of breath, or wheezing Neurological: positive for - numbness/tingling right foot Dermatological: negative Gastrointestinal: no abdominal pain, change in bowel habits, or black or bloody stools, no nausea Genito-Urinary: no dysuria, trouble voiding, or hematuria Hematological and Lymphatic: negative Breast: negative Musculoskeletal: negative Remaining ROS negative.  PAST MEDICAL HISTORY:  Significant for morbid obesity.  The patient being status post lap band surgery under Dr. Johna Sheriff, January 2012.  She has borderline diabetes, high blood pressure, a 20 pack-year smoking history.  The patient quitting in 2000, mild hypercholesterolemia and removal of a submucous myoma from the posterior left uterine wall in April 2001.  FAMILY HISTORY:  The patient's mother is  66 years old. Recent diagnosis of breast cancer, 05-10-11. The patient's father died in his 33s from cirrhosis.  The patient has 1 sister and 1 brother.  There is no breast or ovarian cancer in the immediate family.  There is a maternal uncle with prostate cancer diagnosed in his 58s.  GYNECOLOGIC HISTORY:  She had menarche at age 32, last menstrual period was late May 2012.  Her periods however, are not regular and it had been almost a year before that.  She has never used hormone replacement.  She is GX P0.  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  She attends a [employer].  She goes to the gym at least three times a week.   Medications:   I have reviewed the patient's current medications.  Current Outpatient Prescriptions  Medication Sig Dispense  Refill  . AZOR 5-40 MG per tablet       . b complex vitamins tablet Take 1 tablet by mouth daily.        . Calcium Citrate-Vitamin D (CALCET CREAMY BITES PO) Take by mouth daily.        . hydrochlorothiazide 25 MG tablet Take 25 mg by mouth daily.        [name] lidocaine-prilocaine (EMLA) cream       . multivitamin-iron-minerals-folic acid (CENTRUM) chewable tablet Chew 1 tablet by mouth daily.        . cephALEXin (KEFLEX) 500 MG capsule       . dexamethasone (DECADRON) 4 MG tablet       . ondansetron (ZOFRAN) 8 MG tablet Take 8 mg by mouth every 12 (twelve) hours as needed.        . prochlorperazine (COMPAZINE) 10 MG tablet        No current facility-administered medications for this visit.   Facility-Administered Medications Ordered in Other Visits  Medication Dose Route Frequency Provider Last Rate Last Dose  . 0.9 %  sodium chloride infusion   Intravenous Once Trinity Hyland, PA      . sodium chloride 0.9 % injection 10 mL  10 mL Intracatheter PRN Dashanae Longfield, PA   10 mL at 05/06/11 1721    Allergies: No Known Allergies  Physical Exam: Filed Vitals:   05/13/11 0919  BP: 110/75  Pulse: 96  Temp: 99 F (37.2 C)   HEENT:  Sclerae anicteric, conjunctivae pink.  Oropharynx clear.  No mucositis or candidiasis.   Nodes:  No cervical, supraclavicular, or axillary lymphadenopathy palpated.  Breast Exam:  Deferred  Lungs:  Clear to auscultation bilaterally.  No crackles, rhonchi, or wheezes.   Heart:  Regular rate and rhythm.   Abdomen:  Soft, obese, nontender.  Positive bowel sounds.  No organomegaly or masses palpated.   Musculoskeletal:  No focal spinal tenderness to palpation.  Extremities:  Benign.  No peripheral edema or cyanosis.   Skin:  Benign.   Neuro:  Nonfocal.    Lab Results: Lab Results  Component Value Date   WBC 7.6 05/13/2011   HGB 10.1* 05/13/2011   HCT 32.4* 05/13/2011   MCV 83.3 05/13/2011   PLT 243 05/13/2011   NEUTROABS 5.4 05/13/2011     Chemistry      Component Value  Date/Time   NA 139 05/06/2011 1327   K 3.4* 05/06/2011 1327   CL 104 05/06/2011 1327   CO2 25 05/06/2011 1327   BUN 16 05/06/2011 1327   CREATININE 0.70 05/06/2011 1327      Component Value Date/Time   CALCIUM 9.2 05/06/2011 1327   ALKPHOS 57 05/06/2011 1327   AST 20 05/06/2011 1327   ALT 28 05/06/2011 1327   BILITOT 0.3 05/06/2011 1327        Radiological Studies:  No results found.   Assessment:  56 year old [place] woman   1.  Status post lumpectomy and sentinel lymph node sampling in August 2012 for a 1.1 cm invasive ductal carcinoma, grade 3, with 0 of 2 sentinel lymph nodes involved. T1cN0, stage I. Tumor was ER +98%, PR +13%, HER-2/neu positive with a ratio of 2.3.  2.  Receiving adjuvant chemotherapy, 4 planned q. three-week doses of docetaxel, cyclophosphamide, given with trastuzumab beginning with cycle 3. Patient receives Neulasta on day 2 for granulocyte support.   3. The trastuzumab will be in continued for a total of one year.  4.  The patient will also need radiation therapy, after which she will also start on anti-estrogen therapy.   Plan:  Janda is recovering well from her chemotherapy. Of course we will be continuing trastuzumab on a Q. three-week basis, and that has been scheduled for her through February. She'll be seeing Dr. Dayton Scrape next week to begin radiation therapy. She'll see Dr. Darnelle Catalan in approximately 6 weeks, and at that point they will also be discussing her anti-estrogen therapy.  This plan was reviewed with the patient, who voices understanding and agreement.  She knows to call with any changes or problems.    Analeise Mccleery, PA-C 05/13/2011

## 2011-05-18 ENCOUNTER — Ambulatory Visit: Payer: BC Managed Care – PPO | Admitting: Radiation Oncology

## 2011-05-18 ENCOUNTER — Ambulatory Visit: Payer: BC Managed Care – PPO

## 2011-05-26 ENCOUNTER — Encounter: Payer: Self-pay | Admitting: Radiation Oncology

## 2011-05-26 NOTE — Progress Notes (Signed)
56 year old. Female. Divorced. No children.   Screening mammogram 07/04/2009 showed scattered bilateral mammography. Repeat screening mammogram on 09/29/2010 revealed an abnormality of the left breast. Biopsy 10/26/2010 proved Stage I (T1 N0 M0), high grade invasive ductal carcinoma of the left breast ER positive 98%, PR positive 13% and HER 2 not amplified. MRI scan of breast on 10/30/2010 showed a 1.2x1.8x0.8 cm ill defined area of rapid wash in and out compatible with  her biopsy proven neoplasm. Patient was seen in the [facility] on 11/04/2010. Chemotherapy was started on 02/08/2011. Patient received docetaxel and cyclophosphamide alone with cycles 1 and 2, and then begin three week trastuzumab with cycle 3. Patient will continue trastuzumab for total of one year in addition to her anti estrogen therapy following radiation therapy.

## 2011-05-27 ENCOUNTER — Ambulatory Visit
Admission: RE | Admit: 2011-05-27 | Discharge: 2011-05-27 | Disposition: A | Discharge status: Home / Self Care | Payer: BC Managed Care – PPO | Source: Ambulatory Visit | Attending: Radiation Oncology | Admitting: Radiation Oncology

## 2011-05-27 ENCOUNTER — Encounter: Payer: Self-pay | Admitting: Radiation Oncology

## 2011-05-27 VITALS — BP 127/83 | HR 80 | Temp 99.2°F | Resp 18 | Ht 60.0 in | Wt 221.1 lb

## 2011-05-27 DIAGNOSIS — C50919 Malignant neoplasm of unspecified site of unspecified female breast: Secondary | ICD-10-CM

## 2011-05-27 DIAGNOSIS — Z853 Personal history of malignant neoplasm of breast: Secondary | ICD-10-CM | POA: Insufficient documentation

## 2011-05-27 NOTE — Progress Notes (Signed)
Please see the Nurse Progress Note in the MD Initial Consult Encounter for this patient. 

## 2011-05-27 NOTE — Progress Notes (Addendum)
Followup note:  Diagnosis: Stage I (T1, N0, M0) high-grade invasive ductal carcinoma of the left breast  The patient returns today for review and scheduling of her radiation therapy. I first saw the patient at the [facility] on 11/04/2010. She presented with a mass at 6:30 within left breast, 3 centers from the nipple. This was ER/PR positive with a high Ki-67 of 69%. The area of involvement measured 1.2 x 1.8 x 0.8 on MRI. On 12/10/2010 she underwent a left partial mastectomy and sentinel lymph node biopsy. She was found to have a 1.1 cm invasive ductal carcinoma with focal DCIS. The invasive ductal carcinoma focally extended to within 0.1 cm to the posterior margin. An extra deep margin was obtained. 2 sentinel lymph nodes were free of metastatic disease. The HER-2/neu was repeated by [facility] and found to be amplified appear she will to receive chemotherapy all with Herceptin under the direction of Dr. Darnelle Catalan. She completed her chemotherapy on December 27. She has continued to work during her course of therapy. She is without complaints today.  Physical examination: And neck examination: She wears a wig. Nodes: Without palpable cervical, supraclavicular, or axillary lymphadenopathy. Chest: Lungs clear. Back: Without spinal or CVA tenderness. Breasts: She is large breasted. There is a partial mastectomy wound along the anterior aspect of the left breast centered at approximately 6 to 7:00. No masses are appreciated. Right breast without masses or lesions. Abdomen without hepatomegaly. Extremities without edema.  Impression: Stage I (T1, N0, M0) invasive ductal carcinoma of the left breast. I explained to the patient the potential acute and late toxicities of radiation therapy and she wishes to proceed as outlined. From a technical standpoint I feel that she would be best treated prone with tangential fields expecting to avoid her heart. She'll return here for treatment planning next week. Consent was signed  today. Of note is that she will continue her Herceptin during her course of radiation therapy.  30 minutes was spent face-to-face with the patient, primarily counseling the patient and coordinating her care.

## 2011-05-27 NOTE — Progress Notes (Signed)
Completed PATIENT MEASURE OF DISTRESS worksheet with a score of 5 submitted to social work. Also, complete NUTRITION RISK SCREEN worksheet without concerns submitted to Zenovia Jarred, RD.

## 2011-05-27 NOTE — Progress Notes (Signed)
Patient presents to the clinic today unaccompanied for an initial consultation with Dr. Dayton Scrape. Patient is alert and oriented to person, place, and time. No distress noted. Steady gait noted. Pleasant affect noted. Patient denies pain at this time. Patient denies nausea, vomiting, diarrhea, headache, or dizziness. Patient reports that just yesterday her mother had a mastectomy. Also, patient reports her father passed away in 25-Apr-2023. Patient reports increased stress related to these to issue in addition to her diagnosis. Patient reports eating and sleeping without difficulty. Patient reports that she is schedule for her third dose of Herceptin tomorrow. Reported all findings to Dr. Dayton Scrape  NKDA Patient denies having a pacemaker Patient denies having radiation in the past

## 2011-05-28 ENCOUNTER — Ambulatory Visit: Payer: BC Managed Care – PPO

## 2011-05-28 ENCOUNTER — Other Ambulatory Visit: Payer: BC Managed Care – PPO | Admitting: Lab

## 2011-05-28 ENCOUNTER — Telehealth: Payer: Self-pay | Admitting: Oncology

## 2011-05-28 NOTE — Telephone Encounter (Signed)
lmonvm adviising the pt of her r/s lab and tx appt.

## 2011-05-28 NOTE — Progress Notes (Signed)
Encounter addended by: Ardell Isaacs, RN on: 05/28/2011 11:50 AM<BR>     Documentation filed: Inpatient Patient Education, Charges VN

## 2011-05-31 ENCOUNTER — Ambulatory Visit
Admission: RE | Admit: 2011-05-31 | Discharge: 2011-05-31 | Disposition: A | Discharge status: Home / Self Care | Payer: BC Managed Care – PPO | Source: Ambulatory Visit | Attending: Radiation Oncology | Admitting: Radiation Oncology

## 2011-05-31 ENCOUNTER — Telehealth: Payer: Self-pay | Admitting: Oncology

## 2011-05-31 DIAGNOSIS — C50919 Malignant neoplasm of unspecified site of unspecified female breast: Secondary | ICD-10-CM

## 2011-05-31 NOTE — Telephone Encounter (Signed)
Pt came into the office to r/s her lab and chemo appts

## 2011-05-31 NOTE — Progress Notes (Signed)
Met with patient to discuss RO billing. Patient expressed concerns about OOP expenses.  Will get this information for her and call her with information.  Also, patient concerned about paying bills.  Gave her preliminary financial assessment - she will return at next visit.

## 2011-05-31 NOTE — Progress Notes (Signed)
Simulation/treatment planning note:  The patient was taken to the CT simulator. Her her left partial mastectomy scar was marked with a radiopaque wire. She was placed on the prone breast board. She was set up to medial and lateral left breast tangents. 2 separate multileaf collimators were designed to shield the normal strength structures. I prescribing 4680 cGy in 26 sessions utilizing mixed energies. An isodose plan and dosimetry are requested. This be followed by a boost in the supine position after repeat scanning. I contoured the tumor bed, nipple, and partial mastectomy scars as well. This would be followed by a boost to deliver a further 1440 cGy in 8 sessions in the supine position.

## 2011-06-01 ENCOUNTER — Ambulatory Visit: Payer: BC Managed Care – PPO

## 2011-06-01 ENCOUNTER — Other Ambulatory Visit: Payer: BC Managed Care – PPO | Admitting: Lab

## 2011-06-03 ENCOUNTER — Encounter (INDEPENDENT_AMBULATORY_CARE_PROVIDER_SITE_OTHER): Payer: Self-pay | Admitting: Surgery

## 2011-06-04 ENCOUNTER — Ambulatory Visit (HOSPITAL_BASED_OUTPATIENT_CLINIC_OR_DEPARTMENT_OTHER): Payer: BC Managed Care – PPO

## 2011-06-04 ENCOUNTER — Other Ambulatory Visit (HOSPITAL_BASED_OUTPATIENT_CLINIC_OR_DEPARTMENT_OTHER): Payer: BC Managed Care – PPO | Admitting: Lab

## 2011-06-04 VITALS — BP 147/91 | HR 74 | Temp 98.0°F

## 2011-06-04 DIAGNOSIS — C50919 Malignant neoplasm of unspecified site of unspecified female breast: Secondary | ICD-10-CM

## 2011-06-04 DIAGNOSIS — Z5112 Encounter for antineoplastic immunotherapy: Secondary | ICD-10-CM

## 2011-06-04 DIAGNOSIS — Z853 Personal history of malignant neoplasm of breast: Secondary | ICD-10-CM

## 2011-06-04 DIAGNOSIS — C50119 Malignant neoplasm of central portion of unspecified female breast: Secondary | ICD-10-CM

## 2011-06-04 DIAGNOSIS — C50319 Malignant neoplasm of lower-inner quadrant of unspecified female breast: Secondary | ICD-10-CM

## 2011-06-04 LAB — CBC WITH DIFFERENTIAL/PLATELET
Basophils Absolute: 0 10*3/uL (ref 0.0–0.1)
EOS%: 1.2 % (ref 0.0–7.0)
HCT: 31.5 % — ABNORMAL LOW (ref 34.8–46.6)
HGB: 10.3 g/dL — ABNORMAL LOW (ref 11.6–15.9)
MCH: 26.9 pg (ref 25.1–34.0)
MCV: 82.2 fL (ref 79.5–101.0)
MONO%: 8.2 % (ref 0.0–14.0)
NEUT%: 61.5 % (ref 38.4–76.8)
Platelets: 303 10*3/uL (ref 145–400)

## 2011-06-04 MED ORDER — TRASTUZUMAB CHEMO INJECTION 440 MG
6.0000 mg/kg | Freq: Once | INTRAVENOUS | Status: AC
  Administered 2011-06-04: 588 mg via INTRAVENOUS
  Filled 2011-06-04: qty 28

## 2011-06-04 MED ORDER — ACETAMINOPHEN 325 MG PO TABS
650.0000 mg | ORAL_TABLET | Freq: Once | ORAL | Status: AC
  Administered 2011-06-04: 650 mg via ORAL

## 2011-06-04 MED ORDER — SODIUM CHLORIDE 0.9 % IJ SOLN
10.0000 mL | INTRAMUSCULAR | Status: DC | PRN
  Administered 2011-06-04: 10 mL
  Filled 2011-06-04: qty 10

## 2011-06-04 MED ORDER — DIPHENHYDRAMINE HCL 25 MG PO CAPS
25.0000 mg | ORAL_CAPSULE | Freq: Once | ORAL | Status: AC
  Administered 2011-06-04: 25 mg via ORAL

## 2011-06-04 MED ORDER — HEPARIN SOD (PORK) LOCK FLUSH 100 UNIT/ML IV SOLN
500.0000 [IU] | Freq: Once | INTRAVENOUS | Status: AC | PRN
  Administered 2011-06-04: 500 [IU]
  Filled 2011-06-04: qty 5

## 2011-06-04 MED ORDER — SODIUM CHLORIDE 0.9 % IV SOLN
Freq: Once | INTRAVENOUS | Status: AC
  Administered 2011-06-04: 11:00:00 via INTRAVENOUS

## 2011-06-07 ENCOUNTER — Encounter: Payer: Self-pay | Admitting: *Deleted

## 2011-06-07 ENCOUNTER — Ambulatory Visit
Admission: RE | Admit: 2011-06-07 | Discharge: 2011-06-07 | Disposition: A | Discharge status: Home / Self Care | Payer: BC Managed Care – PPO | Source: Ambulatory Visit | Attending: Radiation Oncology | Admitting: Radiation Oncology

## 2011-06-07 ENCOUNTER — Encounter: Payer: Self-pay | Admitting: Radiation Oncology

## 2011-06-07 NOTE — Progress Notes (Signed)
Simulation verification note: The patient underwent simulation verification for her treatment to the left breast. Her isocenter is in good position and the multileaf collimators contoured the treatment volume appropriately. I personally reviewed her set up in the prone position.

## 2011-06-07 NOTE — Progress Notes (Signed)
[facility] Psychosocial Distress Screening Clinical Social Work  Clinical Social Work was referred by distress screening protocol.  The patient scored a 5 on the Psychosocial Distress Thermometer which indicates moderate distress. Clinical Social Worker was unable to meet with pt in the exam room; therefore contacted pt at home to asses for distress and other psychosocial needs.  After attempts to reach pt CSW left pt a messaging informing her of the supportive services and support team at [facility].  CSW also encouraged pt to call with any questions or concerns.   Tamala Julian, MSW, LCSW Clinical Social Worker [facility] [phone]

## 2011-06-08 ENCOUNTER — Ambulatory Visit
Admission: RE | Admit: 2011-06-08 | Discharge: 2011-06-08 | Disposition: A | Discharge status: Home / Self Care | Payer: BC Managed Care – PPO | Source: Ambulatory Visit | Attending: Radiation Oncology | Admitting: Radiation Oncology

## 2011-06-08 DIAGNOSIS — Z853 Personal history of malignant neoplasm of breast: Secondary | ICD-10-CM

## 2011-06-08 MED ORDER — ALRA NON-METALLIC DEODORANT (RAD-ONC)
1.0000 "application " | Freq: Once | TOPICAL | Status: AC
  Administered 2011-06-08: 1 via TOPICAL

## 2011-06-08 MED ORDER — RADIAPLEXRX EX GEL
Freq: Once | CUTANEOUS | Status: AC
  Administered 2011-06-08: 17:00:00 via TOPICAL

## 2011-06-08 NOTE — Progress Notes (Signed)
Received patient in the clinic today following XRT for post sim education. Patient is alert and oriented to person, place, and time. No distress noted. Steady gait noted. Pleasant affect noted. Patient denies pain at this time. Oriented patient to staff and routine of the clinic. Provided patient with RADIATION THERAPY AND YOU handbook the, reviewed pertinent information with the patient. Provided patient with Radiaplex and Alra then, reviewed use. Provided patient with this writer's business card and encouraged her to phone with needs. Patient verbalized understanding of all thing reviewed.

## 2011-06-09 ENCOUNTER — Ambulatory Visit
Admission: RE | Admit: 2011-06-09 | Discharge: 2011-06-09 | Disposition: A | Discharge status: Home / Self Care | Payer: BC Managed Care – PPO | Source: Ambulatory Visit | Attending: Radiation Oncology | Admitting: Radiation Oncology

## 2011-06-10 ENCOUNTER — Other Ambulatory Visit: Payer: Self-pay | Admitting: Physician Assistant

## 2011-06-10 ENCOUNTER — Ambulatory Visit
Admission: RE | Admit: 2011-06-10 | Discharge: 2011-06-10 | Disposition: A | Discharge status: Home / Self Care | Payer: BC Managed Care – PPO | Source: Ambulatory Visit | Attending: Radiation Oncology | Admitting: Radiation Oncology

## 2011-06-10 ENCOUNTER — Telehealth: Payer: Self-pay | Admitting: Oncology

## 2011-06-10 DIAGNOSIS — R600 Localized edema: Secondary | ICD-10-CM

## 2011-06-10 DIAGNOSIS — I10 Essential (primary) hypertension: Secondary | ICD-10-CM

## 2011-06-10 DIAGNOSIS — C50919 Malignant neoplasm of unspecified site of unspecified female breast: Secondary | ICD-10-CM

## 2011-06-10 MED ORDER — TRIAMTERENE-HCTZ 37.5-25 MG PO CAPS: 1.0000 | ORAL_CAPSULE | ORAL | Status: DC

## 2011-06-10 NOTE — Telephone Encounter (Signed)
S/w the pt regarding the echo appt and the revised appt times in feb for 06/17/2011 and 06/18/2011

## 2011-06-10 NOTE — Progress Notes (Signed)
Patient was in the clinic today to receive her radiation therapy. She continues to receive trastuzumab on a Q. three-week basis, next dose due next Friday on February 8.  The patient contacted the office earlier today with complaints of increased pedal edema. On exam, she does have 2+ pitting edema bilaterally in the lower extremities. A disease called bilaterally. She also has nonpitting edema noted in the upper extremities bilaterally. There is no erythema, no pain to palpation, no palpable cords, and Homans sign is negative bilaterally. The patient is status post treatment with docetaxel/cyclophosphamide/trastuzumab, her fourth and final dose given on 05/06/2011.  [name] denies any increased shortness of breath. She gets a little fatigued with exertion, but notes that this is not new. She denies any signs of orthopnea, or PND. No cough. No chest pain or palpitations. She continues on hydrochlorothiazide at 25 mg daily. She is trying to watch her sodium intake, and is keeping herself well hydrated. I've advised her to discontinue the hydrochlorothiazide, and replace it with Dyazide 37.5/25 mg once daily.  I am also referring [name] back for repeat echocardiogram at the next available appointment. Her last echo was 3 months ago in October, 2012, showing an EF of 65-70%. She will return to see Dr. Darnelle Catalan next week on February 7 to review those results and discuss her treatment plan. If the swelling worsens, [name] knows to  contact us immediately.   Tammy Scale, PA-C 06/10/11

## 2011-06-11 ENCOUNTER — Ambulatory Visit
Admission: RE | Admit: 2011-06-11 | Discharge: 2011-06-11 | Disposition: A | Discharge status: Home / Self Care | Payer: BC Managed Care – PPO | Source: Ambulatory Visit | Attending: Radiation Oncology | Admitting: Radiation Oncology

## 2011-06-14 ENCOUNTER — Other Ambulatory Visit: Payer: Self-pay | Admitting: Oncology

## 2011-06-14 ENCOUNTER — Ambulatory Visit
Admission: RE | Admit: 2011-06-14 | Discharge: 2011-06-14 | Disposition: A | Discharge status: Home / Self Care | Payer: BC Managed Care – PPO | Source: Ambulatory Visit | Attending: Radiation Oncology | Admitting: Radiation Oncology

## 2011-06-14 DIAGNOSIS — Z853 Personal history of malignant neoplasm of breast: Secondary | ICD-10-CM

## 2011-06-14 DIAGNOSIS — C50919 Malignant neoplasm of unspecified site of unspecified female breast: Secondary | ICD-10-CM

## 2011-06-14 NOTE — Progress Notes (Signed)
Only c/o is feet feeling numb but she knows it is from her chemo.  No other c/o.  Skin looks good

## 2011-06-14 NOTE — Progress Notes (Signed)
Weekly Management Note:  Site:L Breast Current Dose:  900  cGy Projected Dose: 6120  cGy  Narrative: The patient is seen today for routine under treatment assessment. CBCT/MVCT images/port films were reviewed. The chart was reviewed.   No complaints today. She has Radioplex gel to use when necessary.  Physical Examination: There were no vitals filed for this visit..  Weight: 216 lb 14.4 oz (98.385 kg). No significant skin changes.  Impression: Tolerating radiation therapy well.  Plan: Continue radiation therapy as planned.

## 2011-06-15 ENCOUNTER — Ambulatory Visit
Admission: RE | Admit: 2011-06-15 | Discharge: 2011-06-15 | Disposition: A | Discharge status: Home / Self Care | Payer: BC Managed Care – PPO | Source: Ambulatory Visit | Attending: Radiation Oncology | Admitting: Radiation Oncology

## 2011-06-16 ENCOUNTER — Ambulatory Visit (HOSPITAL_COMMUNITY)
Admission: RE | Admit: 2011-06-16 | Discharge: 2011-06-16 | Disposition: A | Discharge status: Home / Self Care | Payer: BC Managed Care – PPO | Source: Ambulatory Visit | Attending: Physician Assistant | Admitting: Physician Assistant

## 2011-06-16 ENCOUNTER — Ambulatory Visit (HOSPITAL_COMMUNITY): Payer: BC Managed Care – PPO

## 2011-06-16 ENCOUNTER — Ambulatory Visit
Admission: RE | Admit: 2011-06-16 | Discharge: 2011-06-16 | Disposition: A | Discharge status: Home / Self Care | Payer: BC Managed Care – PPO | Source: Ambulatory Visit | Attending: Radiation Oncology | Admitting: Radiation Oncology

## 2011-06-16 DIAGNOSIS — C801 Malignant (primary) neoplasm, unspecified: Secondary | ICD-10-CM

## 2011-06-16 DIAGNOSIS — C50919 Malignant neoplasm of unspecified site of unspecified female breast: Secondary | ICD-10-CM | POA: Insufficient documentation

## 2011-06-16 DIAGNOSIS — Z09 Encounter for follow-up examination after completed treatment for conditions other than malignant neoplasm: Secondary | ICD-10-CM

## 2011-06-16 NOTE — Progress Notes (Signed)
  Echocardiogram 2D Echocardiogram has been performed.  Brayten Komar Nira Retort 06/16/2011, 3:29 PM

## 2011-06-17 ENCOUNTER — Ambulatory Visit (HOSPITAL_BASED_OUTPATIENT_CLINIC_OR_DEPARTMENT_OTHER): Payer: BC Managed Care – PPO | Admitting: Oncology

## 2011-06-17 ENCOUNTER — Ambulatory Visit: Payer: BC Managed Care – PPO | Admitting: Oncology

## 2011-06-17 ENCOUNTER — Other Ambulatory Visit: Payer: BC Managed Care – PPO | Admitting: Lab

## 2011-06-17 ENCOUNTER — Ambulatory Visit
Admission: RE | Admit: 2011-06-17 | Discharge: 2011-06-17 | Disposition: A | Discharge status: Home / Self Care | Payer: BC Managed Care – PPO | Source: Ambulatory Visit | Attending: Radiation Oncology | Admitting: Radiation Oncology

## 2011-06-17 ENCOUNTER — Other Ambulatory Visit (HOSPITAL_BASED_OUTPATIENT_CLINIC_OR_DEPARTMENT_OTHER): Payer: BC Managed Care – PPO | Admitting: Lab

## 2011-06-17 VITALS — BP 138/91 | HR 97 | Temp 98.8°F | Ht 60.0 in | Wt 216.4 lb

## 2011-06-17 DIAGNOSIS — C50919 Malignant neoplasm of unspecified site of unspecified female breast: Secondary | ICD-10-CM

## 2011-06-17 DIAGNOSIS — Z170 Estrogen receptor positive status [ER+]: Secondary | ICD-10-CM

## 2011-06-17 DIAGNOSIS — Z09 Encounter for follow-up examination after completed treatment for conditions other than malignant neoplasm: Secondary | ICD-10-CM

## 2011-06-17 LAB — CBC WITH DIFFERENTIAL/PLATELET
BASO%: 0.2 % (ref 0.0–2.0)
Basophils Absolute: 0 10*3/uL (ref 0.0–0.1)
HCT: 33.4 % — ABNORMAL LOW (ref 34.8–46.6)
HGB: 10.7 g/dL — ABNORMAL LOW (ref 11.6–15.9)
LYMPH%: 22.1 % (ref 14.0–49.7)
MCH: 26.3 pg (ref 25.1–34.0)
MCHC: 32 g/dL (ref 31.5–36.0)
MONO#: 0.5 10*3/uL (ref 0.1–0.9)
NEUT#: 3.4 10*3/uL (ref 1.5–6.5)
NEUT%: 67.1 % (ref 38.4–76.8)
Platelets: 300 10*3/uL (ref 145–400)

## 2011-06-17 NOTE — Progress Notes (Signed)
Hematology and Oncology Follow Up Visit  [name] [id number] December 25, 1955 56 y.o. 06/17/2011    HPI: [name] is a 56 year old [place] woman referred by Dr. Clelia Croft for treatment of left breast cancer.   The patient underwent screening bilateral mammography July 04, 2009, at [facility] and this showed only a scattered fibroglandular densities.  However, repeat screening exam Sep 29, 2010 showed a potential abnormality in the left breast, retroareolar region.  The patient was recalled for additional views Oct 08, 2010 and Dr. Derinda Late was able to locate the nodular density noted on the screening exam.  It measured approximately 8 mm.  Ultrasound showed a second nodular density just lateral to the nipple areolar complex.  In that area, there was what appears to be either a small cluster of cysts or perhaps a solid nodule.  In particular, the nodule seen in the 3 o'clock position was felt to be most likely a fibroadenoma.  The other area, however, required biopsy and this was performed October 26, 2010.  The pathology from this procedure ([id number]) showed a high-grade invasive ductal carcinoma which was estrogen-receptor positive at 98%, progesterone-receptor positive at 13% with an elevated MIB-1 at 69% and no evidence of HER-2 amplification, the ratio by [facility] being 1.32.   With this information the patient was referred for bilateral breast MRIs and these were performed October 30, 2010.  In the left breast there was a 1.8 cm ill-defined area in the central breast associated with post biopsy changes.  There were really no other suspicious areas in either breast and no bony abnormalities and no abnormal appearing or enlarged lymph nodes.    Patient underwent left lumpectomy and sentinel lymph node sampling December 10, 2010 ([id number]) for a 1.1-cm invasive ductal carcinoma, grade 3, with 0/2 sentinel lymph nodes involved and so T1c N0 (stage I); the tumor being estrogen receptor 98% and progesterone receptor  13% positive, with no HER-2 amplification (by initial determination, but amplified on repeat with a ratio of 2.30); with an Oncotype DX score of 43 predicting a 29% risk of recurrence if she takes 5 years of tamoxifen.  (Her-2 was negative on the Oncotype DX determination, which uses a completely different method).   Decision was made to proceed with adjuvant chemotherapy, specifically 4 cycles of q. three-week docetaxel and cyclophosphamide given along with trastuzumab, to be followed by radiation and antiestrogen therapy. The trastuzumab will be continued for one year.   Interim History:   [name] returns today for followup of her early stage breast cancer. Since her last visit here she has begun radiation, which is scheduled to continue through mid March. Also her mother has been diagnosed with breast cancer. She was recently started on tamoxifen.   Review of Systems: She is tolerating the radiation without any side effects so far, other than perhaps mild fatigue. She is having a "funny feeling" in the middle of her feet. Not in the balls or heals. Of of course she stands all day at her work. She doesn't like to her tennis shoes to work. Otherwise she has mild urinary incontinence which is not a new problem. A detailed review of systems was otherwise noncontributory  PAST MEDICAL HISTORY:  Significant for morbid obesity.  The patient being status post lap band surgery under Dr. Johna Sheriff, January 2012.  She has borderline diabetes, high blood pressure, a 20 pack-year smoking history.  The patient quitting in 2000, mild hypercholesterolemia and removal of a submucous myoma from the posterior left uterine  wall in April 2001.  FAMILY HISTORY:  The patient's mother is  59 years old. Recent diagnosis of breast cancer, 2011-05-19. The patient's father died in his 42s from cirrhosis.  The patient has 1 sister and 1 brother.  There is no breast or ovarian cancer in the immediate family.  There is a maternal  uncle with prostate cancer diagnosed in his 47s.  GYNECOLOGIC HISTORY:  She had menarche at age 76, last menstrual period was late May 2012.  Her periods however, are not regular and it had been almost a year before that.  She has never used hormone replacement.  She is GX P0.  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  She attends a [employer].  She goes to the gym at least three times a week.   Medications:   I have reviewed the patient's current medications.  Current Outpatient Prescriptions  Medication Sig Dispense Refill  . AZOR 5-40 MG per tablet       . b complex vitamins tablet Take 1 tablet by mouth daily.        . hydrochlorothiazide 25 MG tablet Take 25 mg by mouth daily.        [name] lidocaine-prilocaine (EMLA) cream       . multivitamin-iron-minerals-folic acid (CENTRUM) chewable tablet Chew 1 tablet by mouth daily.        . non-metallic deodorant Thornton Papas) MISC Apply 1 application topically daily as needed.      . Wound Cleansers (RADIAPLEX EX) Apply topically.      . Calcium Citrate-Vitamin D (CALCET CREAMY BITES PO) Take by mouth daily.        . cephALEXin (KEFLEX) 500 MG capsule       . dexamethasone (DECADRON) 4 MG tablet       . ondansetron (ZOFRAN) 8 MG tablet Take 8 mg by mouth every 12 (twelve) hours as needed.        . prochlorperazine (COMPAZINE) 10 MG tablet       . triamterene-hydrochlorothiazide (DYAZIDE) 37.5-25 MG per capsule Take 1 each (1 capsule total) by mouth every morning.  30 capsule  0   No current facility-administered medications for this visit.   Facility-Administered Medications Ordered in Other Visits  Medication Dose Route Frequency Provider Last Rate Last Dose  . 0.9 %  sodium chloride infusion   Intravenous Once Amy Berry, PA      . sodium chloride 0.9 % injection 10 mL  10 mL Intracatheter PRN Amy Berry, PA   10 mL at 05/06/11 1721    Allergies: No Known Allergies  Physical Exam: Filed Vitals:    06/17/11 1701  BP: 138/91  Pulse: 97  Temp: 98.8 F (37.1 C)   HEENT:  Sclerae anicteric, conjunctivae pink.  Oropharynx clear.  No mucositis or candidiasis.   Nodes:  No cervical, supraclavicular, or axillary lymphadenopathy palpated.  Breast Exam:  Right breast was unremarkable. Left breast is status post lumpectomy and currently receiving radiation. There is no skin breakdown. There are no palpable masses. Lungs:  Clear to auscultation bilaterally.  No crackles, rhonchi, or wheezes.   Heart:  Regular rate and rhythm.   Abdomen:  Soft, obese, nontender.  Positive bowel sounds.  No organomegaly or masses palpated.   Musculoskeletal:  No focal spinal tenderness to palpation.  Extremities:  Benign.  No peripheral edema or cyanosis.   Skin:  Benign.   Neuro:  Nonfocal.    Lab Results: Lab  Results  Component Value Date   WBC 5.1 06/17/2011   HGB 10.7* 06/17/2011   HCT 33.4* 06/17/2011   MCV 82.1 06/17/2011   PLT 300 06/17/2011   NEUTROABS 3.4 06/17/2011     Chemistry      Component Value Date/Time   NA 139 05/06/2011 1327   K 3.4* 05/06/2011 1327   CL 104 05/06/2011 1327   CO2 25 05/06/2011 1327   BUN 16 05/06/2011 1327   CREATININE 0.70 05/06/2011 1327      Component Value Date/Time   CALCIUM 9.2 05/06/2011 1327   ALKPHOS 57 05/06/2011 1327   AST 20 05/06/2011 1327   ALT 28 05/06/2011 1327   BILITOT 0.3 05/06/2011 1327        Radiological Studies: Echo 06/16/2011  Left ventricle: The cavity size was normal. Systolic function was normal. The estimated ejection fraction was in the range of 55% to 60%. Wall motion was normal; there were no regional wall motion abnormalities.  Assessment:  56 year old [place] woman   1.  Status post lumpectomy and sentinel lymph node sampling iAugust 2012 for a T1cN0, stage I invasive ductal carcinoma, grade 3, estrogen receptor 98% and progesterone receptor 13% positive, HER-2/neu amplified with a ratio of 2.3. Oncotype showed a recurrence  score of 43 ( "high risk"), predicting a 29% risk of recurrence if the patient's only adjuvant treatment was tamoxifen  2.  status post adjuvant  docetaxel/ cyclophosphamide, completed December 2012  3. trastuzumab started 04/15/2011, to be continued to total one year.  4. radiation therapy, to be completed July 23, 2011   Plan: There may have been a moderate drop in her ejection fraction, even though it is still in the normal range. I think it would be helpful for Dr. Nicholes Mango to review the patient's echoes and to also eventually see the patient's. I am operationalizing that.   Otherwise Korbin is doing very well. She is going to see me again on March 22, shortly after completing her radiation treatments. At that time we will discuss antiestrogen therapy. I will set her up for a DEXA scan before that visit.   MAGRINAT,GUSTAV C, PA-C 06/17/2011

## 2011-06-18 ENCOUNTER — Ambulatory Visit: Payer: BC Managed Care – PPO

## 2011-06-18 ENCOUNTER — Encounter: Payer: Self-pay | Admitting: Oncology

## 2011-06-18 ENCOUNTER — Telehealth: Payer: Self-pay | Admitting: Oncology

## 2011-06-18 ENCOUNTER — Other Ambulatory Visit: Payer: Self-pay | Admitting: Oncology

## 2011-06-18 ENCOUNTER — Ambulatory Visit
Admission: RE | Admit: 2011-06-18 | Discharge: 2011-06-18 | Disposition: A | Discharge status: Home / Self Care | Payer: BC Managed Care – PPO | Source: Ambulatory Visit | Attending: Radiation Oncology | Admitting: Radiation Oncology

## 2011-06-18 NOTE — Telephone Encounter (Signed)
lmonvm advising the pt of her march 2013 appts and to pick up a calendar along with the bone density appt and the appt to see dr Clarise Cruz in march.

## 2011-06-18 NOTE — Progress Notes (Signed)
I asked Dr. Nicholes Mango to review of the recent echocardiogram obtained on this patient. He feels the ejection fraction is still in the 65-70% range. The lateral wall motion is fine. He suggests continuing Herceptin. Accordingly we will resume Herceptin on March 1 as ordered.

## 2011-06-21 ENCOUNTER — Ambulatory Visit
Admission: RE | Admit: 2011-06-21 | Discharge: 2011-06-21 | Disposition: A | Discharge status: Home / Self Care | Payer: BC Managed Care – PPO | Source: Ambulatory Visit | Attending: Radiation Oncology | Admitting: Radiation Oncology

## 2011-06-21 ENCOUNTER — Encounter: Payer: Self-pay | Admitting: Radiation Oncology

## 2011-06-21 VITALS — BP 143/89 | HR 76 | Resp 18 | Wt 215.4 lb

## 2011-06-21 DIAGNOSIS — C50919 Malignant neoplasm of unspecified site of unspecified female breast: Secondary | ICD-10-CM

## 2011-06-21 NOTE — Progress Notes (Signed)
Patient presents to the clinic today unaccompanied for under treat visit with Dr. Dayton Scrape. Patient is alert and oriented to person, place, and time. No distress noted. Steady gait noted. Pleasant affect noted. Patient denies pain at this time. Patient has no complaints at this time. Patient seen by Dr. Dayton Scrape.

## 2011-06-21 NOTE — Progress Notes (Signed)
Weekly Management Note:  Site:L Breast Current Dose:  1800  cGy Projected Dose: 6120  cGy  Narrative: The patient is seen today for routine under treatment assessment. CBCT/MVCT images/port films were reviewed. The chart was reviewed.   No complaints today. She has not yet started to use Radioplex gel.  Physical Examination:  Filed Vitals:   06/21/11 1644  BP: 143/89  Pulse: 76  Resp: 18  .  Weight: 215 lb 6.4 oz (97.705 kg). Faint hyperpigmentation of the skin along the left breast. No areas of desquamation.  Impression: Tolerating radiation therapy well.  Plan: Continue radiation therapy as planned.

## 2011-06-22 ENCOUNTER — Ambulatory Visit
Admission: RE | Admit: 2011-06-22 | Discharge: 2011-06-22 | Disposition: A | Discharge status: Home / Self Care | Payer: BC Managed Care – PPO | Source: Ambulatory Visit | Attending: Radiation Oncology | Admitting: Radiation Oncology

## 2011-06-23 ENCOUNTER — Ambulatory Visit
Admission: RE | Admit: 2011-06-23 | Discharge: 2011-06-23 | Disposition: A | Discharge status: Home / Self Care | Payer: BC Managed Care – PPO | Source: Ambulatory Visit | Attending: Radiation Oncology | Admitting: Radiation Oncology

## 2011-06-24 ENCOUNTER — Ambulatory Visit
Admission: RE | Admit: 2011-06-24 | Discharge: 2011-06-24 | Disposition: A | Discharge status: Home / Self Care | Payer: BC Managed Care – PPO | Source: Ambulatory Visit | Attending: Radiation Oncology | Admitting: Radiation Oncology

## 2011-06-25 ENCOUNTER — Ambulatory Visit
Admission: RE | Admit: 2011-06-25 | Discharge: 2011-06-25 | Disposition: A | Discharge status: Home / Self Care | Payer: BC Managed Care – PPO | Source: Ambulatory Visit | Attending: Radiation Oncology | Admitting: Radiation Oncology

## 2011-06-28 ENCOUNTER — Ambulatory Visit
Admission: RE | Admit: 2011-06-28 | Discharge: 2011-06-28 | Disposition: A | Discharge status: Home / Self Care | Payer: BC Managed Care – PPO | Source: Ambulatory Visit | Attending: Radiation Oncology | Admitting: Radiation Oncology

## 2011-06-28 ENCOUNTER — Encounter: Payer: Self-pay | Admitting: Radiation Oncology

## 2011-06-28 VITALS — BP 137/86 | HR 81 | Resp 18 | Wt 216.3 lb

## 2011-06-28 DIAGNOSIS — C50919 Malignant neoplasm of unspecified site of unspecified female breast: Secondary | ICD-10-CM

## 2011-06-28 NOTE — Progress Notes (Signed)
Weekly Management Note:  Site:L Breast Current Dose:  2700  cGy Projected Dose: 6120  cGy  Narrative: The patient is seen today for routine under treatment assessment. CBCT/MVCT images/port films were reviewed. The chart was reviewed.   She is without complaints today except for some discomfort along the inframammary region of her left breast. She does report a rash around her mouth. This began after eating pistachio nuts. She is on a new medication (HCTZ/triamterene) generic.  Physical Examination:  Filed Vitals:   06/28/11 1701  BP: 137/86  Pulse: 81  Resp: 18  .  Weight: 216 lb 4.8 oz (98.113 kg). There is hyperpigmentation of the skin along the left breast with no areas of desquamation.  Impression: Tolerating radiation therapy well. She may be allergic to pistachio nuts.  Plan: Continue radiation therapy as planned.

## 2011-06-28 NOTE — Progress Notes (Signed)
Patient presents to the clinic to the clinic today unaccompanied for under treat visit with Dr. Dayton Scrape. Patient is alert and oriented to person, place, and time. No distress noted. Steady gait noted. Pleasant affect noted. Patient denies pain at this time. Patient reports hyperpigmentation/"tanning" of the left/treated breast. Reinforced skin care with Radiaplex. Patient verbalized understanding. Patient has no complaints at this time. Patient has questions about her "boost" treatment. Reported all findings to Dr. Dayton Scrape.

## 2011-06-29 ENCOUNTER — Ambulatory Visit
Admission: RE | Admit: 2011-06-29 | Discharge: 2011-06-29 | Disposition: A | Discharge status: Home / Self Care | Payer: BC Managed Care – PPO | Source: Ambulatory Visit | Attending: Radiation Oncology | Admitting: Radiation Oncology

## 2011-06-30 ENCOUNTER — Ambulatory Visit
Admission: RE | Admit: 2011-06-30 | Discharge: 2011-06-30 | Disposition: A | Discharge status: Home / Self Care | Payer: BC Managed Care – PPO | Source: Ambulatory Visit | Attending: Radiation Oncology | Admitting: Radiation Oncology

## 2011-07-01 ENCOUNTER — Ambulatory Visit
Admission: RE | Admit: 2011-07-01 | Discharge: 2011-07-01 | Disposition: A | Discharge status: Home / Self Care | Payer: BC Managed Care – PPO | Source: Ambulatory Visit | Attending: Radiation Oncology | Admitting: Radiation Oncology

## 2011-07-02 ENCOUNTER — Ambulatory Visit
Admission: RE | Admit: 2011-07-02 | Discharge: 2011-07-02 | Disposition: A | Discharge status: Home / Self Care | Payer: BC Managed Care – PPO | Source: Ambulatory Visit | Attending: Radiation Oncology | Admitting: Radiation Oncology

## 2011-07-05 ENCOUNTER — Ambulatory Visit
Admission: RE | Admit: 2011-07-05 | Discharge: 2011-07-05 | Disposition: A | Discharge status: Home / Self Care | Payer: BC Managed Care – PPO | Source: Ambulatory Visit | Attending: Radiation Oncology | Admitting: Radiation Oncology

## 2011-07-05 ENCOUNTER — Encounter: Payer: Self-pay | Admitting: Radiation Oncology

## 2011-07-05 VITALS — BP 143/87 | HR 75 | Resp 18 | Wt 216.7 lb

## 2011-07-05 DIAGNOSIS — C50919 Malignant neoplasm of unspecified site of unspecified female breast: Secondary | ICD-10-CM | POA: Insufficient documentation

## 2011-07-05 DIAGNOSIS — D0590 Unspecified type of carcinoma in situ of unspecified breast: Secondary | ICD-10-CM | POA: Insufficient documentation

## 2011-07-05 NOTE — Progress Notes (Signed)
Patient presents to the clinic today for under treat visit with Dr. Dayton Scrape. Patient is alert and oriented to person, place, and time. No distress noted. Steady gait noted. Pleasant affect noted. Patient denies pain at this time. Patient reports a burning discomfort between her breast and her left axilla. Patient reported hyperpigmentation of the left/treated breast. Provided patient with hydrogel pads and reviewed use. Patient has no other complaints. Reported all findings to Dr. Dayton Scrape.

## 2011-07-05 NOTE — Progress Notes (Signed)
Weekly Management Note:  Site:L Breast Current Dose:  3600  cGy Projected Dose: 6120  cGy  Narrative: The patient is seen today for routine under treatment assessment. CBCT/MVCT images/port films were reviewed. The chart was reviewed.   She does report some discomfort along her left axilla and inframammary region. She is now using Radioplex gel.   Physical Examination:  Filed Vitals:   07/05/11 1652  BP: 143/87  Pulse: 75  Resp: 18  .  Weight: 216 lb 11.2 oz (98.294 kg). There is hyperpigmentation of the skin along with erythema along the inframammary region and left axilla. No areas of desquamation but there is impending dry desquamation along the axilla and inframammary region.  Impression: Tolerating radiation therapy well.  Plan: Continue radiation therapy as planned.

## 2011-07-06 ENCOUNTER — Other Ambulatory Visit: Payer: BC Managed Care – PPO

## 2011-07-06 ENCOUNTER — Ambulatory Visit
Admission: RE | Admit: 2011-07-06 | Discharge: 2011-07-06 | Disposition: A | Discharge status: Home / Self Care | Payer: BC Managed Care – PPO | Source: Ambulatory Visit | Attending: Radiation Oncology | Admitting: Radiation Oncology

## 2011-07-07 ENCOUNTER — Ambulatory Visit
Admission: RE | Admit: 2011-07-07 | Discharge: 2011-07-07 | Disposition: A | Discharge status: Home / Self Care | Payer: BC Managed Care – PPO | Source: Ambulatory Visit | Attending: Radiation Oncology | Admitting: Radiation Oncology

## 2011-07-07 DIAGNOSIS — C50919 Malignant neoplasm of unspecified site of unspecified female breast: Secondary | ICD-10-CM

## 2011-07-07 NOTE — Progress Notes (Signed)
Simulation note: The patient underwent simulation in the supine position for her left breast boost. A custom neck mold was constructed on the breast board for immobilization. A left partial mastectomy scar was marked with a radiopaque wire. Her nipples also marked with a BB. I marked her isocenter with a BB. She was then scanned. I contoured her tumor bed, guided by her surgical clips. I also contoured her nipple and partial mastectomy scar. I prescribing 1440 cGy in 8 sessions utilizing 18 MV photons. Electron beam energy was chosen based on the tip of her tumor bed as seen on her CT scan. A special port plan is requested, and one custom block was constructed to conform the field.

## 2011-07-08 ENCOUNTER — Ambulatory Visit
Admission: RE | Admit: 2011-07-08 | Discharge: 2011-07-08 | Disposition: A | Discharge status: Home / Self Care | Payer: BC Managed Care – PPO | Source: Ambulatory Visit | Attending: Radiation Oncology | Admitting: Radiation Oncology

## 2011-07-08 ENCOUNTER — Telehealth: Payer: Self-pay | Admitting: *Deleted

## 2011-07-08 NOTE — Telephone Encounter (Signed)
Message left by pt stating she was called about an appt for herceptin for tomorrow.  Her understanding per concern with slight decline in  ejection fraction with most recent echo is for her to be evaluated by Dr Gala Romney first.  She is scheduled to see Dr Gala Romney on Monday 3/4.[name]  Plan per MD note/ pt's verbalization and review with AB/PA is to hold treatment scheduled for tomorrow.  Pt is scheduled to see Dr Darnelle Catalan with treatment on 3/22 .  Message left for pt on her identified VM per her request. Appointment for herceptin tomorrow canceled and pharmacy notified of situation.

## 2011-07-09 ENCOUNTER — Ambulatory Visit
Admission: RE | Admit: 2011-07-09 | Discharge: 2011-07-09 | Disposition: A | Discharge status: Home / Self Care | Payer: BC Managed Care – PPO | Source: Ambulatory Visit | Attending: Radiation Oncology | Admitting: Radiation Oncology

## 2011-07-09 ENCOUNTER — Ambulatory Visit: Payer: BC Managed Care – PPO

## 2011-07-09 ENCOUNTER — Other Ambulatory Visit: Payer: BC Managed Care – PPO | Admitting: Lab

## 2011-07-12 ENCOUNTER — Ambulatory Visit
Admission: RE | Admit: 2011-07-12 | Discharge: 2011-07-12 | Disposition: A | Discharge status: Home / Self Care | Payer: BC Managed Care – PPO | Source: Ambulatory Visit | Attending: Radiation Oncology | Admitting: Radiation Oncology

## 2011-07-12 ENCOUNTER — Ambulatory Visit (HOSPITAL_COMMUNITY)
Admission: RE | Admit: 2011-07-12 | Discharge: 2011-07-12 | Disposition: A | Discharge status: Home / Self Care | Payer: BC Managed Care – PPO | Source: Ambulatory Visit | Attending: Internal Medicine | Admitting: Internal Medicine

## 2011-07-12 ENCOUNTER — Encounter: Payer: Self-pay | Admitting: Radiation Oncology

## 2011-07-12 VITALS — BP 137/87 | HR 71 | Resp 18 | Wt 213.7 lb

## 2011-07-12 VITALS — BP 148/78 | HR 75 | Wt 216.0 lb

## 2011-07-12 DIAGNOSIS — G4730 Sleep apnea, unspecified: Secondary | ICD-10-CM | POA: Insufficient documentation

## 2011-07-12 DIAGNOSIS — Z87891 Personal history of nicotine dependence: Secondary | ICD-10-CM | POA: Insufficient documentation

## 2011-07-12 DIAGNOSIS — I10 Essential (primary) hypertension: Secondary | ICD-10-CM | POA: Insufficient documentation

## 2011-07-12 DIAGNOSIS — C50919 Malignant neoplasm of unspecified site of unspecified female breast: Secondary | ICD-10-CM

## 2011-07-12 DIAGNOSIS — IMO0002 Reserved for concepts with insufficient information to code with codable children: Secondary | ICD-10-CM | POA: Insufficient documentation

## 2011-07-12 DIAGNOSIS — E119 Type 2 diabetes mellitus without complications: Secondary | ICD-10-CM | POA: Insufficient documentation

## 2011-07-12 DIAGNOSIS — Z79899 Other long term (current) drug therapy: Secondary | ICD-10-CM | POA: Insufficient documentation

## 2011-07-12 MED ORDER — AMLODIPINE-OLMESARTAN 10-40 MG PO TABS: 1.0000 | ORAL_TABLET | Freq: Every day | ORAL | Status: DC

## 2011-07-12 NOTE — Progress Notes (Signed)
Referring Physician: Dr. Darnelle Catalan  Primary Care: Dr. Clelia Croft  Primary Cardiologist:   HPI:  [name] is a 56 y.o. 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  She is status post left lumpectomy on 12/10/10.  She was started on adjuvant chemotherapy with (started 02/25/11)  4 cycles of q3 week docetaxel and cyclophosphamide along with trastuzumab.  Then to continue with trastuzumab for 1 year.    Echo's: 02/18/11: EF 65-70, lateral s' velocity 10.0 (Hyperdynamic) 06/16/11: EF 55-60%, lateral s' velocity 9.8 (re-read as 65%)  She is here to establish care while on herceptin therapy.  She feels good today.  She did have swelling in feet/hands and increased weight gain in the beginning on February after her herceptin therapy.  Up to 220ish from 210, still 215 at home.  Started on dyazide.  Hasn't taken dyazide in 2 days due to inconvenience of driving.  No chest pain.  No SOB/orthopnea/PND.     Review of Systems: [y] = yes, [ ]  = no   General: Weight gain [name] ]; Weight loss [ ] ; Anorexia [ ] ; Fatigue [ ] ; Fever [ ] ; Chills [ ] ; Weakness [ ]   Cardiac: Chest pain/pressure [ ] ; Resting SOB [ ] ; Exertional SOB [ ] ; Orthopnea [ ] ; Pedal Edema [name] ]; Palpitations [ ] ; Syncope [ ] ; Presyncope [ ] ; Paroxysmal nocturnal dyspnea[ ]   Pulmonary: Cough [ ] ; Wheezing[ ] ; Hemoptysis[ ] ; Sputum [ ] ; Snoring [ ]   GI: Vomiting[ ] ; Dysphagia[ ] ; Melena[ ] ; Hematochezia [ ] ; Heartburn[ ] ; Abdominal pain [ ] ; Constipation [ ] ; Diarrhea [ ] ; BRBPR [ ]   GU: Hematuria[ ] ; Dysuria [ ] ; Nocturia[ ]   Vascular: Pain in legs with walking [ ] ; Pain in feet with lying flat [ ] ; Non-healing sores [ ] ; Stroke [ ] ; TIA [ ] ; Slurred speech [ ] ;  Neuro: Headaches[ ] ; Vertigo[ ] ; Seizures[ ] ; Paresthesias[ ] ;Blurred vision [ ] ; Diplopia [ ] ; Vision changes [ ]   Ortho/Skin: Arthritis [ ] ; Joint pain [ ] ; Muscle pain [ ] ; Joint swelling [ ] ; Back Pain [ ] ; Rash [ ]   Psych: Depression[ ] ; Anxiety[ ]   Heme: Bleeding problems [  ]; Clotting disorders [ ] ; Anemia [ ]   Endocrine: Diabetes [ ] ; Thyroid dysfunction[ ]    Past Medical History  Diagnosis Date  . Hypertension   . Breast lump     left  . Gum disease   . Cancer   . 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 Diabetes mellitus     borderline  . Sleep apnea     Current Outpatient Prescriptions  Medication Sig Dispense Refill  . AZOR 5-40 MG per tablet 1 tablet daily.       [name] b complex vitamins tablet Take 1 tablet by mouth daily.        . Calcium Citrate-Vitamin D (CALCET CREAMY BITES PO) Take by mouth daily.       [name] lidocaine-prilocaine (EMLA) cream Apply 1 application topically daily.       . multivitamin-iron-minerals-folic acid (CENTRUM) chewable tablet Chew 1 tablet by mouth daily.        . non-metallic deodorant Thornton Papas) MISC Apply 1 application topically daily as needed.      . triamterene-hydrochlorothiazide (DYAZIDE) 37.5-25 MG per capsule Take 1 each (1 capsule total) by mouth every morning.  30 capsule  0  . Wound Cleansers (RADIAPLEX EX) Apply topically.      [name]  dexamethasone (DECADRON) 4 MG tablet       . ondansetron (ZOFRAN) 8 MG tablet Take 8 mg by mouth every 12 (twelve) hours as needed.        . prochlorperazine (COMPAZINE) 10 MG tablet        No current facility-administered medications for this encounter.   Facility-Administered Medications Ordered in Other Encounters  Medication Dose Route Frequency Provider Last Rate Last Dose  . 0.9 %  sodium chloride infusion   Intravenous Once Amy Berry, PA      . sodium chloride 0.9 % injection 10 mL  10 mL Intracatheter PRN Zollie Scale, PA   10 mL at 05/06/11 1721    No Known Allergies  History   Social History  . Marital Status: Divorced    Spouse Name: N/A    Number of Children: N/A  . Years of Education: N/A   Occupational History  . Not on file.   Social History Main Topics  . Smoking status: Former Smoker -- 1.0 packs/day for 21 years    Types:  Cigarettes    Quit date: 03/25/1999  . Smokeless tobacco: Never Used  . Alcohol Use: 0.6 oz/week    1 Glasses of wine per week  . Drug Use: No  . Sexually Active: Yes   Other Topics Concern  . Not on file   Social History Narrative  . 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    Family History  Problem Relation Age of Onset  . Cancer Maternal Uncle 70    prostate  . Alcohol abuse Father   . Cirrhosis Father   . Cancer Mother     breast    PHYSICAL EXAM: Filed Vitals:   07/12/11 1013  BP: 148/78  Pulse: 75  Weight: 216 lb (97.977 kg)  SpO2: 100%    General:  Well appearing. No respiratory difficulty HEENT: normal Neck: supple. no JVD. Carotids 2+ bilat; no bruits. No lymphadenopathy or thryomegaly appreciated. Cor: PMI nondisplaced. Regular rate & rhythm. No rubs, gallops or murmurs. Lungs: clear Abdomen: obese, soft, nontender, nondistended. No hepatosplenomegaly. No bruits or masses. Good bowel sounds. Extremities: no cyanosis, clubbing, rash, trace edema Neuro: alert & oriented x 3, cranial nerves grossly intact. moves all 4 extremities w/o difficulty. Affect pleasant.   ASSESSMENT & PLAN:

## 2011-07-12 NOTE — Progress Notes (Signed)
Weekly Management Note:  Site:L Breast Current Dose:  4500  cGy Projected Dose: 6120  cGy  Narrative: The patient is seen today for routine under treatment assessment. CBCT/MVCT images/port films were reviewed. The chart was reviewed.   She is having a fair amount discomfort along the left inframammary region. This of her pain is dull and constant, and at other times her pain is sharp and lasting only a few seconds, and as severe as 6/10. She has been using Radioplex gel.  Physical Examination:  Filed Vitals:   07/12/11 1658  BP: 137/87  Pulse: 71  Resp: 18  .  Weight: 213 lb 11.2 oz (96.934 kg). There is moderate hyperpigmentation the skin with patchy dry desquamation and focal moist desquamation along the left inframammary region.  Impression: Tolerating radiation therapy well, however, she is symptomatic from her desquamation along the inframammary region. I think we should go ahead and start her on Triple Antibiotic ointment along the inframammary fold. I checked the electron beam setup today, and we are using a lead shield adjacent to the inframammary fold to limit the degree of desquamation. Electron beam field also encompassed as her nipple, and I may simply use tangential photon fields for her boost is well, limiting the dose prescription that I currently have for electrons.  Plan: Continue radiation therapy as planned.

## 2011-07-12 NOTE — Progress Notes (Signed)
Patient presents to the clinic today unaccompanied for under treat visit with Dr. Dayton Scrape. Patient is alert and oriented to person, place, and time. No distress noted. Steady gait noted. Pleasant affect noted. Patient reports intermittent brief "jolting" left breast pain 6 on a scale of 0-10. Cardiologist increase blood pressure medication today. Patient reports that she is scheduled for another round of her Herceptin in approximately two weeks. Patient reports moist desquamation under her left breast. Patient reports using Radiaplex as directed. Reported all findings to Dr. Dayton Scrape.

## 2011-07-12 NOTE — Assessment & Plan Note (Signed)
As above, increase azor 10-40 mg daily.

## 2011-07-12 NOTE — Patient Instructions (Signed)
Start Azor 10-40 mg daily.  Follow up 2 months with an echo.

## 2011-07-12 NOTE — Assessment & Plan Note (Addendum)
Discussed role of HF clinic and reviewed echos in full today.  Volume status looks good.  LVEF remains normal.  She is ok to continue therapy at this time.  Will follow close with routine echos.  Will need to keep hypertension under better control.  Will increase azor 10-40 mg daily, have instructed her this could cause fluid restriction.    Patient seen and examined with Ulyess Blossom PA-C. We discussed all aspects of the encounter. I agree with the assessment and plan as stated above. Echos reviewed in detail. EF is table at 65%. Tissue Doppler parameters also stable. No significant volume overload on exam. Continue Herceptin. Will continue to follow closely.

## 2011-07-13 ENCOUNTER — Ambulatory Visit (INDEPENDENT_AMBULATORY_CARE_PROVIDER_SITE_OTHER): Payer: BC Managed Care – PPO | Admitting: Gynecology

## 2011-07-13 ENCOUNTER — Encounter: Payer: BC Managed Care – PPO | Admitting: Gynecology

## 2011-07-13 ENCOUNTER — Encounter: Payer: Self-pay | Admitting: Gynecology

## 2011-07-13 ENCOUNTER — Ambulatory Visit
Admission: RE | Admit: 2011-07-13 | Discharge: 2011-07-13 | Disposition: A | Discharge status: Home / Self Care | Payer: BC Managed Care – PPO | Source: Ambulatory Visit | Attending: Radiation Oncology | Admitting: Radiation Oncology

## 2011-07-13 VITALS — BP 130/90 | Ht 59.25 in | Wt 215.0 lb

## 2011-07-13 DIAGNOSIS — Z01419 Encounter for gynecological examination (general) (routine) without abnormal findings: Secondary | ICD-10-CM

## 2011-07-13 DIAGNOSIS — Z0000 Encounter for general adult medical examination without abnormal findings: Secondary | ICD-10-CM

## 2011-07-13 NOTE — Progress Notes (Signed)
[name] [birth year] [id number]   History:    56 y.o.  for annual exam who stated her last menstrual period May 2012. Patient had an [facility] several years ago which was borderline elevated. Patient history of a lap banding 2012 by Dr. Odie [name] . 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 She has had no lab tests recently so no additional blood work will be drawn today. Dr. Clelia [name] is her primary who is been following her for her borderline type 2 diabetes as well as hypertension. Her last bone density study was in 2011. Her last Pap smear was normal in 2011.  Past medical history,surgical history, family history and social history were all reviewed and documented in the EPIC chart.  Gynecologic History Patient's last menstrual period was 10/06/2010. Contraception: none Last Pap: 2011. Results were: normal Last mammogram: 2012. Results were:  As noted above  Obstetric History OB History    Grav Para Term Preterm Abortions TAB SAB Ect Mult Living   1 0   1  1   0     # Outc Date GA Lbr Len/2nd Wgt Sex Del Anes PTL Lv   1 SAB                ROS:  Was performed and pertinent positives and negatives are included in the history.  Exam: chaperone present  BP 130/90  Ht 4' 11.25" (1.505 m)  Wt 215 lb (97.523 kg)  BMI 43.06 kg/m2  LMP 10/06/2010  Body mass index is 43.06 kg/(m^2).  General appearance : Well developed well nourished female. No acute distress HEENT: Neck supple, trachea midline, no carotid bruits, no thyroidmegaly Lungs: Clear to auscultation, no rhonchi or wheezes, or rib retractions  Heart: Regular rate and rhythm, no murmurs or gallops Breast:Examined in sitting and supine position were symmetrical in appearance, no palpable masses or  tenderness,  no skin retraction, no nipple inversion, no nipple discharge, no skin discoloration, no axillary or supraclavicular lymphadenopathy external skin redness as a result of her radiation treatment on the left breast. Abdomen: no palpable masses or tenderness, no rebound or guarding Extremities: no edema or skin discoloration or tenderness  Pelvic:  Bartholin, Urethra, Skene Glands: Within normal limits             Vagina: No gross lesions or discharge  Cervix: No gross lesions or discharge  Uterus  anteverted, normal size, shape and consistency, non-tender and mobile  Adnexa  Without masses or tenderness  Anus and perineum  normal   Rectovaginal  normal sphincter tone without palpated masses or tenderness             Hemoccult  Cards provided for patient to submit to the office for testing.   Assessment/Plan:  56 y.o. female for annual exam currently undergoing radiation treatment for her left breast intraductal carcinoma. Review of her record indicated that her tumors were estrogen and progesterone receptor positive. We have discussed with her to discuss with the medical oncologist to perhaps consider laparoscopically removing both ovaries when she finishes her radiation treatment. We discussed  new Pap smear guidelines. She has had normal Pap smears in the past. No Pap smear was done today. Her primary physician has been doing her lab work so no lab work was drawn today. She will  schedule a bone density study here in the office the next few weeks to compare with previous study of March 2011 here in the office which was normal. Fecal occult blood testing cards presented to the office for her to submit to the office at a later date. Her last colonoscopy was 4 years ago was reportedly normal.    Ok Edwards MD, 5:23 PM 07/13/2011

## 2011-07-14 ENCOUNTER — Ambulatory Visit
Admission: RE | Admit: 2011-07-14 | Discharge: 2011-07-14 | Disposition: A | Discharge status: Home / Self Care | Payer: BC Managed Care – PPO | Source: Ambulatory Visit | Attending: Radiation Oncology | Admitting: Radiation Oncology

## 2011-07-15 ENCOUNTER — Ambulatory Visit
Admission: RE | Admit: 2011-07-15 | Discharge: 2011-07-15 | Disposition: A | Discharge status: Home / Self Care | Payer: BC Managed Care – PPO | Source: Ambulatory Visit | Attending: Radiation Oncology | Admitting: Radiation Oncology

## 2011-07-16 ENCOUNTER — Ambulatory Visit
Admission: RE | Admit: 2011-07-16 | Discharge: 2011-07-16 | Disposition: A | Discharge status: Home / Self Care | Payer: BC Managed Care – PPO | Source: Ambulatory Visit | Attending: Radiation Oncology | Admitting: Radiation Oncology

## 2011-07-19 ENCOUNTER — Encounter: Payer: Self-pay | Admitting: Radiation Oncology

## 2011-07-19 ENCOUNTER — Ambulatory Visit
Admission: RE | Admit: 2011-07-19 | Discharge: 2011-07-19 | Disposition: A | Discharge status: Home / Self Care | Payer: BC Managed Care – PPO | Source: Ambulatory Visit | Attending: Radiation Oncology | Admitting: Radiation Oncology

## 2011-07-19 VITALS — BP 142/94 | HR 73 | Resp 18 | Wt 217.4 lb

## 2011-07-19 DIAGNOSIS — C50919 Malignant neoplasm of unspecified site of unspecified female breast: Secondary | ICD-10-CM

## 2011-07-19 NOTE — Progress Notes (Signed)
Weekly Management Note:  Site:L Breast Current Dose:  5400  cGy Projected Dose: 6120  cGy  Narrative: The patient is seen today for routine under treatment assessment. CBCT/MVCT images/port films were reviewed. The chart was reviewed.   She is without new complaints today. She is having a fair amount of discomfort along the left inframammary region and also nipple. This is to be expected.  Physical Examination:  Filed Vitals:   07/19/11 1633  BP: 142/94  Pulse: 73  Resp: 18  .  Weight: 217 lb 6.4 oz (98.612 kg). There is marked hyperpigmentation of the skin with patchy dry desquamation along the left inframammary region. The nipple is edematous.  Impression: Tolerating radiation therapy well. I will see her again this Thursday to her progress. If her symptoms are worse then I may drop her last treatment.  Plan: Continue radiation therapy as planned.

## 2011-07-19 NOTE — Progress Notes (Signed)
Patient presents to the clinic today for under treat visit with Dr. Dayton Scrape. Patient is alert and oriented to person, place, and time. No distress noted. Steady gait noted. Pleasant affect noted. Patient reports intermittent brief sharp shooting pain her in her left nipple. Patient reports hyperpigmentation of the treated breast. Patient reports using Radiaplex as directed. Patient recovering from a head cold. Patient has no other complaints. Reported all findings to Dr. Dayton Scrape.

## 2011-07-20 ENCOUNTER — Other Ambulatory Visit: Payer: Self-pay | Admitting: *Deleted

## 2011-07-20 ENCOUNTER — Ambulatory Visit: Payer: BC Managed Care – PPO | Admitting: Oncology

## 2011-07-20 ENCOUNTER — Ambulatory Visit
Admission: RE | Admit: 2011-07-20 | Discharge: 2011-07-20 | Disposition: A | Discharge status: Home / Self Care | Payer: BC Managed Care – PPO | Source: Ambulatory Visit | Attending: Radiation Oncology | Admitting: Radiation Oncology

## 2011-07-21 ENCOUNTER — Ambulatory Visit (HOSPITAL_BASED_OUTPATIENT_CLINIC_OR_DEPARTMENT_OTHER): Payer: BC Managed Care – PPO

## 2011-07-21 ENCOUNTER — Ambulatory Visit: Payer: BC Managed Care – PPO | Admitting: Oncology

## 2011-07-21 ENCOUNTER — Ambulatory Visit
Admission: RE | Admit: 2011-07-21 | Discharge: 2011-07-21 | Disposition: A | Discharge status: Home / Self Care | Payer: BC Managed Care – PPO | Source: Ambulatory Visit | Attending: Radiation Oncology | Admitting: Radiation Oncology

## 2011-07-21 VITALS — BP 138/81 | HR 82 | Temp 99.3°F

## 2011-07-21 DIAGNOSIS — Z452 Encounter for adjustment and management of vascular access device: Secondary | ICD-10-CM

## 2011-07-21 DIAGNOSIS — C50119 Malignant neoplasm of central portion of unspecified female breast: Secondary | ICD-10-CM

## 2011-07-21 DIAGNOSIS — C50919 Malignant neoplasm of unspecified site of unspecified female breast: Secondary | ICD-10-CM

## 2011-07-21 MED ORDER — HEPARIN SOD (PORK) LOCK FLUSH 100 UNIT/ML IV SOLN
500.0000 [IU] | Freq: Once | INTRAVENOUS | Status: AC
  Administered 2011-07-21: 500 [IU] via INTRAVENOUS
  Filled 2011-07-21: qty 5

## 2011-07-21 MED ORDER — SODIUM CHLORIDE 0.9 % IJ SOLN
10.0000 mL | INTRAMUSCULAR | Status: DC | PRN
  Administered 2011-07-21: 10 mL via INTRAVENOUS
  Filled 2011-07-21: qty 10

## 2011-07-22 ENCOUNTER — Ambulatory Visit
Admission: RE | Admit: 2011-07-22 | Discharge: 2011-07-22 | Disposition: A | Discharge status: Home / Self Care | Payer: BC Managed Care – PPO | Source: Ambulatory Visit | Attending: Radiation Oncology | Admitting: Radiation Oncology

## 2011-07-22 ENCOUNTER — Encounter: Payer: Self-pay | Admitting: Radiation Oncology

## 2011-07-22 NOTE — Progress Notes (Signed)
Weekly Management Note:  Site:L Breast Current Dose:  5400  cGy Projected Dose: 6120  cGy  Narrative: The patient is seen today for routine under treatment assessment. CBCT/MVCT images/port films were reviewed. The chart was reviewed.   Without new complaints today.  Physical Examination: There were no vitals filed for this visit..  Weight:  . There is marked hyperpigmentation of skin with dry desquamation along the inframammary region where there is hypopigmentation.  Impression: Tolerating radiation therapy well.  Plan: Continue radiation therapy as planned. She'll finish her radiation therapy tomorrow and I will see her back for a one-month followup visit.

## 2011-07-23 ENCOUNTER — Ambulatory Visit
Admission: RE | Admit: 2011-07-23 | Discharge: 2011-07-23 | Disposition: A | Discharge status: Home / Self Care | Payer: BC Managed Care – PPO | Source: Ambulatory Visit | Attending: Radiation Oncology | Admitting: Radiation Oncology

## 2011-07-26 ENCOUNTER — Encounter: Payer: Self-pay | Admitting: Radiation Oncology

## 2011-07-26 NOTE — Progress Notes (Signed)
[facility] Radiation Oncology End of Treatment Note  Name:[name]  Date:07/26/2011           WUJ:[id number] DOB:25-May-1955   Status:outpatient    CC: Dr. Buren Kos, Dr. Ovidio Kin [facility]), Dr. Reynaldo Minium, Dr. Marikay Alar Magrinat [facility])  REFERRING PHYSICIAN: Dr. Ovidio Kin   DIAGNOSIS: Stage I (T1, N0, M0) high-grade invasive ductal carcinoma of the left breast   INDICATION FOR TREATMENT: Curative   TREATMENT DATES: 06/08/2011 through 07/23/2011                          SITE/DOSE: Left breast 4680 cGy 26 sessions, left breast boost 1440 cGy 8 sessions                           BEAMS/ENERGY:  Mixed 6 MV/18 MV tangential fields to the left breast. 18 MEV electrons, left breast boost                 NARRATIVE:  The patient tolerated her treatment reasonably well although she had marked hyperpigmentation of the skin along with dry desquamation as expected.   She had hypo-pigmentation along the left inframammary fold.                       PLAN: Routine followup in one month. Patient instructed to call if questions or worsening complaints in interim.

## 2011-07-29 ENCOUNTER — Other Ambulatory Visit: Payer: BC Managed Care – PPO | Admitting: Lab

## 2011-07-29 ENCOUNTER — Ambulatory Visit: Payer: BC Managed Care – PPO | Admitting: Oncology

## 2011-07-30 ENCOUNTER — Encounter: Payer: Self-pay | Admitting: Physician Assistant

## 2011-07-30 ENCOUNTER — Ambulatory Visit (HOSPITAL_BASED_OUTPATIENT_CLINIC_OR_DEPARTMENT_OTHER): Payer: BC Managed Care – PPO

## 2011-07-30 ENCOUNTER — Telehealth: Payer: Self-pay | Admitting: *Deleted

## 2011-07-30 ENCOUNTER — Other Ambulatory Visit (HOSPITAL_BASED_OUTPATIENT_CLINIC_OR_DEPARTMENT_OTHER): Payer: BC Managed Care – PPO | Admitting: Lab

## 2011-07-30 ENCOUNTER — Ambulatory Visit (HOSPITAL_BASED_OUTPATIENT_CLINIC_OR_DEPARTMENT_OTHER): Payer: BC Managed Care – PPO | Admitting: Physician Assistant

## 2011-07-30 VITALS — BP 135/85 | HR 73 | Temp 98.6°F | Ht 59.2 in | Wt 212.0 lb

## 2011-07-30 DIAGNOSIS — C50919 Malignant neoplasm of unspecified site of unspecified female breast: Secondary | ICD-10-CM

## 2011-07-30 DIAGNOSIS — C50119 Malignant neoplasm of central portion of unspecified female breast: Secondary | ICD-10-CM

## 2011-07-30 DIAGNOSIS — Z170 Estrogen receptor positive status [ER+]: Secondary | ICD-10-CM

## 2011-07-30 DIAGNOSIS — Z5112 Encounter for antineoplastic immunotherapy: Secondary | ICD-10-CM

## 2011-07-30 DIAGNOSIS — J329 Chronic sinusitis, unspecified: Secondary | ICD-10-CM

## 2011-07-30 DIAGNOSIS — Z853 Personal history of malignant neoplasm of breast: Secondary | ICD-10-CM

## 2011-07-30 LAB — COMPREHENSIVE METABOLIC PANEL
AST: 14 U/L (ref 0–37)
Alkaline Phosphatase: 58 U/L (ref 39–117)
BUN: 14 mg/dL (ref 6–23)
Creatinine, Ser: 0.73 mg/dL (ref 0.50–1.10)
Glucose, Bld: 94 mg/dL (ref 70–99)
Potassium: 3.9 mEq/L (ref 3.5–5.3)
Total Bilirubin: 0.4 mg/dL (ref 0.3–1.2)

## 2011-07-30 LAB — CBC WITH DIFFERENTIAL/PLATELET
Eosinophils Absolute: 0.1 10*3/uL (ref 0.0–0.5)
HCT: 37.4 % (ref 34.8–46.6)
LYMPH%: 21.1 % (ref 14.0–49.7)
MCHC: 32.9 g/dL (ref 31.5–36.0)
MONO#: 0.3 10*3/uL (ref 0.1–0.9)
NEUT#: 1.9 10*3/uL (ref 1.5–6.5)
NEUT%: 64.9 % (ref 38.4–76.8)
Platelets: 257 10*3/uL (ref 145–400)
WBC: 2.9 10*3/uL — ABNORMAL LOW (ref 3.9–10.3)

## 2011-07-30 MED ORDER — DIPHENHYDRAMINE HCL 25 MG PO CAPS
25.0000 mg | ORAL_CAPSULE | Freq: Once | ORAL | Status: AC
  Administered 2011-07-30: 25 mg via ORAL

## 2011-07-30 MED ORDER — FLUTICASONE PROPIONATE 50 MCG/ACT NA SUSP: 1.0000 | Freq: Two times a day (BID) | NASAL | Status: DC

## 2011-07-30 MED ORDER — TAMOXIFEN CITRATE 20 MG PO TABS: 20.0000 mg | ORAL_TABLET | Freq: Every day | ORAL | Status: AC

## 2011-07-30 MED ORDER — SODIUM CHLORIDE 0.9 % IJ SOLN
10.0000 mL | INTRAMUSCULAR | Status: DC | PRN
  Administered 2011-07-30: 10 mL
  Filled 2011-07-30: qty 10

## 2011-07-30 MED ORDER — ACETAMINOPHEN 325 MG PO TABS
650.0000 mg | ORAL_TABLET | Freq: Once | ORAL | Status: AC
  Administered 2011-07-30: 650 mg via ORAL

## 2011-07-30 MED ORDER — HEPARIN SOD (PORK) LOCK FLUSH 100 UNIT/ML IV SOLN
500.0000 [IU] | Freq: Once | INTRAVENOUS | Status: AC | PRN
  Administered 2011-07-30: 500 [IU]
  Filled 2011-07-30: qty 5

## 2011-07-30 MED ORDER — TRASTUZUMAB CHEMO INJECTION 440 MG
6.0000 mg/kg | Freq: Once | INTRAVENOUS | Status: AC
  Administered 2011-07-30: 588 mg via INTRAVENOUS
  Filled 2011-07-30: qty 28

## 2011-07-30 NOTE — Telephone Encounter (Signed)
gave patient appointments for 08-2011 and 09-2011 printed out calendar and gave to the patient in the chemo room

## 2011-07-30 NOTE — Progress Notes (Signed)
ID: [name]   DOB: 02/19/1956  MR#: [id number]  JXB#:[id number]  HISTORY OF PRESENT ILLNESS: Bernardine Langworthy is a 56 year old [place] woman referred by Dr. Clelia Croft for treatment of left breast cancer.  The patient underwent screening bilateral mammography July 04, 2009, at [facility] and this showed only a scattered fibroglandular densities. However, repeat screening exam Sep 29, 2010 showed a potential abnormality in the left breast, retroareolar region. The patient was recalled for additional views Oct 08, 2010 and Dr. Derinda Late was able to locate the nodular density noted on the screening exam. It measured approximately 8 mm. Ultrasound showed a second nodular density just lateral to the nipple areolar complex. In that area, there was what appears to be either a small cluster of cysts or perhaps a solid nodule. In particular, the nodule seen in the 3 o'clock position was felt to be most likely a fibroadenoma. The other area, however, required biopsy and this was performed October 26, 2010. The pathology from this procedure ([id number]) showed a high-grade invasive ductal carcinoma which was estrogen-receptor positive at 98%, progesterone-receptor positive at 13% with an elevated MIB-1 at 69% and no evidence of HER-2 amplification, the ratio by CISH being 1.32.  With this information the patient was referred for bilateral breast MRIs and these were performed October 30, 2010. In the left breast there was a 1.8 cm ill-defined area in the central breast associated with post biopsy changes. There were really no other suspicious areas in either breast and no bony abnormalities and no abnormal appearing or enlarged lymph nodes.  Patient underwent left lumpectomy and sentinel lymph node sampling December 10, 2010 ([id number]) for a 1.1-cm invasive ductal carcinoma, grade 3, with 0/2 sentinel lymph nodes involved and so T1c N0 (stage I); the tumor being estrogen receptor 98% and progesterone receptor 13% positive, with no  HER-2 amplification (by initial determination, but amplified on repeat with a ratio of 2.30); with an Oncotype DX score of 43 predicting a 29% risk of recurrence if she takes 5 years of tamoxifen. (Her-2 was negative on the Oncotype DX determination, which uses a completely different method).  Decision was made to proceed with adjuvant chemotherapy, specifically 4 cycles of q. three-week docetaxel and cyclophosphamide given along with trastuzumab, to be followed by radiation and antiestrogen therapy. The trastuzumab will be continued for one year.  INTERVAL HISTORY: Remmy returns today for followup of her left breast carcinoma, due for her next every 3 week dose of trastuzumab, and ready to discuss antiestrogen therapy. She recently completed radiation to the left breast which she tolerated quite well, with only some mild skin changes.   REVIEW OF SYSTEMS: Onetha is feeling well, with the exception of a lingering "cold". She continues to have some sinus congestion, her ears feel "full", and she has mild fatigue. Fortunately, she has had no fevers, chills, night sweats, productive cough, or shortness of breath.  She is healing well from the radiation, with some slight residual skin changes. There is no pain in the breast.  Lempi has not had a menstrual cycle since May of 2012.  Otherwise a detailed review of systems is noncontributory.  PAST MEDICAL HISTORY: Past Medical History  Diagnosis Date  . Hypertension   . Breast lump     left  . Gum disease   . Cancer   . 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 Diabetes mellitus     borderline  . Sleep apnea  PAST SURGICAL HISTORY: Past Surgical History  Procedure Date  . Laparoscopic gastric banding   . Foot mass excision   . Soft tissue cyst excision     back cyst  . Vaginal mass excision   . Mass excision     back  . Portacath placement 02/11/11  . Resectoscopic polypectomy 2001    MYOMECTOMY  .  Breast lumpectomy 2012    left    FAMILY HISTORY Family History  Problem Relation Age of Onset  . Cancer Maternal Uncle 70    prostate  . Alcohol abuse Father   . Cirrhosis Father   . Cancer Mother     breast   GYNECOLOGIC HISTORY: She had menarche at age 72, last menstrual period was late May 2012. Her periods however, are not regular and it had been almost a year before that. She has never used hormone replacement. She is GX P0.   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 She attends a [employer]. She goes to the gym at least three times a week.     ADVANCED DIRECTIVES:  HEALTH MAINTENANCE: History  Substance Use Topics  . Smoking status: Former Smoker -- 1.0 packs/day for 21 years    Types: Cigarettes    Quit date: 03/25/1999  . Smokeless tobacco: Never Used  . Alcohol Use: 0.6 oz/week    1 Glasses of wine per week     Colonoscopy:  PAP:  Bone density:  Lipid panel:  No Known Allergies  Current Outpatient Prescriptions  Medication Sig Dispense Refill  . hydrochlorothiazide (HYDRODIURIL) 25 MG tablet Take 25 mg by mouth daily.      [name] amLODipine-olmesartan (AZOR) 10-40 MG per tablet Take 1 tablet by mouth daily.  30 tablet  6  . b complex vitamins tablet Take 1 tablet by mouth daily.        . Calcium Citrate-Vitamin D (CALCET CREAMY BITES PO) Take by mouth daily.       . fluticasone (FLONASE) 50 MCG/ACT nasal spray Place 1 spray into the nose 2 (two) times daily.  1 g  0  . lidocaine-prilocaine (EMLA) cream Apply 1 application topically daily.       . multivitamin-iron-minerals-folic acid (CENTRUM) chewable tablet Chew 1 tablet by mouth daily.        . non-metallic deodorant Thornton Papas) MISC Apply 1 application topically daily as needed.      . sodium chloride 0.9 % SOLN 250 mL with trastuzumab 440 MG SOLR 2 mg/kg Inject 2 mg/kg into the vein. Every three weeks...      . tamoxifen (NOLVADEX) 20 MG tablet Take 1 tablet (20 mg  total) by mouth daily.  30 tablet  11  . Wound Cleansers (RADIAPLEX EX) Apply topically.       No current facility-administered medications for this visit.   Facility-Administered Medications Ordered in Other Visits  Medication Dose Route Frequency Provider Last Rate Last Dose  . 0.9 %  sodium chloride infusion   Intravenous Once Catalina Gravel, PA      . acetaminophen (TYLENOL) tablet 650 mg  650 mg Oral Once Catalina Gravel, PA   650 mg at 07/30/11 0958  . diphenhydrAMINE (BENADRYL) capsule 25 mg  25 mg Oral Once Catalina Gravel, PA   25 mg at 07/30/11 0958  . heparin lock flush 100 unit/mL  500 Units Intracatheter Once PRN Catalina Gravel, PA   500 Units at 07/30/11 1051  . sodium  chloride 0.9 % injection 10 mL  10 mL Intracatheter PRN Catalina Gravel, PA   10 mL at 05/06/11 1721  . trastuzumab (HERCEPTIN) 588 mg in sodium chloride 0.9 % 250 mL chemo infusion  6 mg/kg (Treatment Plan Actual) Intravenous Once Catalina Gravel, PA   588 mg at 07/30/11 1015  . DISCONTD: sodium chloride 0.9 % injection 10 mL  10 mL Intracatheter PRN Catalina Gravel, PA   10 mL at 07/30/11 1051    OBJECTIVE: Filed Vitals:   07/30/11 0844  BP: 135/85  Pulse: 73  Temp: 98.6 F (37 C)     Body mass index is 42.53 kg/(m^2).    ECOG FS: 0  Physical Exam: HEENT:  Sclerae anicteric, conjunctivae pink.  Oropharynx clear. TMs are dull bilaterally with poor light reflex. No erythema. Nodes:  No cervical, supraclavicular, or axillary lymphadenopathy palpated.  Breast Exam:  Moist desquamation noted in the lower portion of the left breast, otherwise the breast is slightly hyperpigmented secondary to ratiation but benign.  Lungs:  Clear to auscultation bilaterally.  No crackles, rhonchi, or wheezes.   Heart:  Regular rate and rhythm.   Abdomen:  Soft, nontender.  Positive bowel sounds.  No organomegaly or masses palpated.   Musculoskeletal:  No focal spinal tenderness to palpation.  Extremities:  Benign.  No peripheral edema or cyanosis.     Skin:  Benign.   Neuro:  Nonfocal.   LAB RESULTS: Lab Results  Component Value Date   WBC 2.9* 07/30/2011   NEUTROABS 1.9 07/30/2011   HGB 12.3 07/30/2011   HCT 37.4 07/30/2011   MCV 79.7 07/30/2011   PLT 257 07/30/2011      Chemistry      Component Value Date/Time   NA 139 05/06/2011 1327   K 3.4* 05/06/2011 1327   CL 104 05/06/2011 1327   CO2 25 05/06/2011 1327   BUN 16 05/06/2011 1327   CREATININE 0.70 05/06/2011 1327      Component Value Date/Time   CALCIUM 9.2 05/06/2011 1327   ALKPHOS 57 05/06/2011 1327   AST 20 05/06/2011 1327   ALT 28 05/06/2011 1327   BILITOT 0.3 05/06/2011 1327       Lab Results  Component Value Date   LABCA2 27 11/04/2010    STUDIES: No results found.  ASSESSMENT: 56 year old [place] woman  1. Status post lumpectomy and sentinel lymph node sampling iAugust 2012 for a T1cN0, stage I invasive ductal carcinoma, grade 3, estrogen receptor 98% and progesterone receptor 13% positive, HER-2/neu amplified with a ratio of 2.3. Oncotype showed a recurrence score of 43 ( "high risk"), predicting a 29% risk of recurrence if the patient's only adjuvant treatment was tamoxifen  2. status post adjuvant docetaxel/ cyclophosphamide, completed December 2012  3. trastuzumab started 04/15/2011, to be continued to total one year.  4. radiation therapy, completed July 23, 2011  PLAN: Caron is ready to begin her antiestrogen therapy which will consist of tamoxifen. Over half of our 50 minute appointment today was spent reviewing the benefits as well as the possible side effects associated with tamoxifen, and Taaliyah was given written information about the drug. I will note that her mother was recently started on tamoxifen for breast cancer, so Cheryel is quite informed, and quite comfortable with our plan.  She will receive trastuzumab today as scheduled, and every 3 weeks. I will see her for followup in 6 weeks to assess tolerance of the tamoxifen, but she will  call prior to  that time with any changes or problems.  Of note I am also prescribing Flonase nasal spray for an apparent sinusitis.  Brittnee Gaetano    07/30/2011

## 2011-08-18 ENCOUNTER — Telehealth: Payer: Self-pay | Admitting: *Deleted

## 2011-08-18 ENCOUNTER — Other Ambulatory Visit: Payer: Self-pay | Admitting: Physician Assistant

## 2011-08-18 NOTE — Telephone Encounter (Signed)
left voice message inform the patient of the new time but the same day appointment

## 2011-08-20 ENCOUNTER — Ambulatory Visit (HOSPITAL_BASED_OUTPATIENT_CLINIC_OR_DEPARTMENT_OTHER): Payer: BC Managed Care – PPO

## 2011-08-20 ENCOUNTER — Other Ambulatory Visit (HOSPITAL_BASED_OUTPATIENT_CLINIC_OR_DEPARTMENT_OTHER): Payer: BC Managed Care – PPO

## 2011-08-20 VITALS — BP 125/78 | HR 77 | Temp 98.1°F

## 2011-08-20 DIAGNOSIS — Z853 Personal history of malignant neoplasm of breast: Secondary | ICD-10-CM

## 2011-08-20 DIAGNOSIS — C50119 Malignant neoplasm of central portion of unspecified female breast: Secondary | ICD-10-CM

## 2011-08-20 DIAGNOSIS — Z170 Estrogen receptor positive status [ER+]: Secondary | ICD-10-CM

## 2011-08-20 DIAGNOSIS — Z5112 Encounter for antineoplastic immunotherapy: Secondary | ICD-10-CM

## 2011-08-20 DIAGNOSIS — C50919 Malignant neoplasm of unspecified site of unspecified female breast: Secondary | ICD-10-CM

## 2011-08-20 LAB — CBC WITH DIFFERENTIAL/PLATELET
Basophils Absolute: 0 10*3/uL (ref 0.0–0.1)
EOS%: 1.1 % (ref 0.0–7.0)
Eosinophils Absolute: 0 10*3/uL (ref 0.0–0.5)
HCT: 36.2 % (ref 34.8–46.6)
HGB: 12 g/dL (ref 11.6–15.9)
MCH: 26.1 pg (ref 25.1–34.0)
MCV: 78.9 fL — ABNORMAL LOW (ref 79.5–101.0)
MONO%: 7.1 % (ref 0.0–14.0)
NEUT#: 2.2 10*3/uL (ref 1.5–6.5)
NEUT%: 63.5 % (ref 38.4–76.8)
Platelets: 247 10*3/uL (ref 145–400)

## 2011-08-20 MED ORDER — ACETAMINOPHEN 325 MG PO TABS
650.0000 mg | ORAL_TABLET | Freq: Once | ORAL | Status: AC
  Administered 2011-08-20: 650 mg via ORAL

## 2011-08-20 MED ORDER — DIPHENHYDRAMINE HCL 25 MG PO CAPS
25.0000 mg | ORAL_CAPSULE | Freq: Once | ORAL | Status: AC
  Administered 2011-08-20: 25 mg via ORAL

## 2011-08-20 MED ORDER — SODIUM CHLORIDE 0.9 % IJ SOLN
10.0000 mL | INTRAMUSCULAR | Status: DC | PRN
  Administered 2011-08-20: 10 mL
  Filled 2011-08-20: qty 10

## 2011-08-20 MED ORDER — HEPARIN SOD (PORK) LOCK FLUSH 100 UNIT/ML IV SOLN
500.0000 [IU] | Freq: Once | INTRAVENOUS | Status: AC | PRN
  Administered 2011-08-20: 500 [IU]
  Filled 2011-08-20: qty 5

## 2011-08-20 MED ORDER — SODIUM CHLORIDE 0.9 % IV SOLN
Freq: Once | INTRAVENOUS | Status: AC
  Administered 2011-08-20: 15:00:00 via INTRAVENOUS

## 2011-08-20 MED ORDER — TRASTUZUMAB CHEMO INJECTION 440 MG
6.0000 mg/kg | Freq: Once | INTRAVENOUS | Status: AC
  Administered 2011-08-20: 588 mg via INTRAVENOUS
  Filled 2011-08-20: qty 28

## 2011-08-20 NOTE — Patient Instructions (Signed)
[facility] Discharge Instructions for Patients Receiving Chemotherapy  Today you received the following chemotherapy agents Herceptin.   If you develop nausea and vomiting that is not controlled by your nausea medication, call the clinic. If it is after clinic hours your family physician or the after hours number for the clinic or go to the Emergency Department.   BELOW ARE SYMPTOMS THAT SHOULD BE REPORTED IMMEDIATELY:  *FEVER GREATER THAN 100.5 F  *CHILLS WITH OR WITHOUT FEVER  NAUSEA AND VOMITING THAT IS NOT CONTROLLED WITH YOUR NAUSEA MEDICATION  *UNUSUAL SHORTNESS OF BREATH  *UNUSUAL BRUISING OR BLEEDING  TENDERNESS IN MOUTH AND THROAT WITH OR WITHOUT PRESENCE OF ULCERS  *URINARY PROBLEMS  *BOWEL PROBLEMS  UNUSUAL RASH Items with * indicate a potential emergency and should be followed up as soon as possible.  One of the nurses will contact you 24 hours after your treatment. Please let the nurse know about any problems that you may have experienced. Feel free to call the clinic you have any questions or concerns. The clinic phone number is [phone].   I have been informed and understand all the instructions given to me. I know to contact the clinic, my physician, or go to the Emergency Department if any problems should occur. I do not have any questions at this time, but understand that I may call the clinic during office hours   should I have any questions or need assistance in obtaining follow up care.    __________________________________________  _____________  __________ Signature of Patient or Authorized Representative            Date                   Time    __________________________________________ Nurse's Signature   

## 2011-08-24 ENCOUNTER — Ambulatory Visit
Admission: RE | Admit: 2011-08-24 | Discharge: 2011-08-24 | Disposition: A | Discharge status: Home / Self Care | Payer: BC Managed Care – PPO | Source: Ambulatory Visit | Attending: Radiation Oncology | Admitting: Radiation Oncology

## 2011-08-24 ENCOUNTER — Encounter: Payer: Self-pay | Admitting: Radiation Oncology

## 2011-08-24 VITALS — BP 132/90 | HR 81 | Temp 97.7°F | Resp 18 | Wt 215.1 lb

## 2011-08-24 DIAGNOSIS — C50919 Malignant neoplasm of unspecified site of unspecified female breast: Secondary | ICD-10-CM

## 2011-08-24 NOTE — Progress Notes (Signed)
Patient presents to the clinic today unaccompanied for a follow up appointment with Dr. Dayton Scrape. Patient is alert and oriented to person, place, and time. No distress noted. Steady gait noted. Pleasant affect noted. Patient denies pain at this time. Patient reports that she has had a sore throat for past few days denies congestion or nasal drainage. Patient reports she received a round of Herceptin last Friday with the next scheduled for May 3rd. Patient applying hydrocortisone to rash around mouth. Patient reports doing well on Tamoxifen. Patient has no other complaints at this time. Patient reports her skin "is looking pretty good." Reported all findings to Dr. Dayton Scrape.

## 2011-08-24 NOTE — Progress Notes (Signed)
Followup note:  [name] returns today approximately 1 month following completion of radiation therapy after conservative surgery and adjuvant chemotherapy for her stage I (T1, N0, M0) high-grade invasive ductal carcinoma of the left breast. She is to do well although she does have a slight sore throat which she attributes to her allergies/pollen. She continues with her Herceptin through Dr. Darnelle [name]. She is not yet have a scheduled followup appointment with Dr. Ezzard [name]. She sees Dr. Darnelle [name] in early May.  Physical examination: Nodes: Without palpable cervical, supraclavicular, or axillary lymphadenopathy. Chest: Lungs clear. Breasts: There is residual hyperpigmentation of the skin along the left breast with architectural distortion along the inferior aspect of left breast. There is mild to moderate thickening of the left breast, no masses are appreciated. Right breast without masses or lesions. Extremities without upper extremity edema. She does have 1+ ankle edema.  Impression: Satisfactory progress. She'll see Dr. Darnelle [name] in early May and probably Dr. Ezzard [name] in late spring or summer. She typically gets mammography in May and she can certainly have a baseline left breast mammogram and a screening right breast mammogram later this summer.  Plan:   I've not [name] for a formal followup note and she'll be followed by Dr. [name] and Dr. [name].

## 2011-08-30 ENCOUNTER — Other Ambulatory Visit: Payer: Self-pay | Admitting: Physician Assistant

## 2011-09-10 ENCOUNTER — Other Ambulatory Visit (HOSPITAL_BASED_OUTPATIENT_CLINIC_OR_DEPARTMENT_OTHER): Payer: BC Managed Care – PPO | Admitting: Lab

## 2011-09-10 ENCOUNTER — Telehealth: Payer: Self-pay | Admitting: *Deleted

## 2011-09-10 ENCOUNTER — Telehealth: Payer: Self-pay | Admitting: Oncology

## 2011-09-10 ENCOUNTER — Encounter: Payer: Self-pay | Admitting: Physician Assistant

## 2011-09-10 ENCOUNTER — Ambulatory Visit (HOSPITAL_BASED_OUTPATIENT_CLINIC_OR_DEPARTMENT_OTHER): Payer: BC Managed Care – PPO | Admitting: Physician Assistant

## 2011-09-10 ENCOUNTER — Ambulatory Visit (HOSPITAL_BASED_OUTPATIENT_CLINIC_OR_DEPARTMENT_OTHER): Payer: BC Managed Care – PPO

## 2011-09-10 VITALS — BP 132/84 | HR 77 | Temp 98.5°F | Ht 59.2 in | Wt 216.1 lb

## 2011-09-10 DIAGNOSIS — Z853 Personal history of malignant neoplasm of breast: Secondary | ICD-10-CM

## 2011-09-10 DIAGNOSIS — Z170 Estrogen receptor positive status [ER+]: Secondary | ICD-10-CM

## 2011-09-10 DIAGNOSIS — C50119 Malignant neoplasm of central portion of unspecified female breast: Secondary | ICD-10-CM

## 2011-09-10 DIAGNOSIS — C50919 Malignant neoplasm of unspecified site of unspecified female breast: Secondary | ICD-10-CM

## 2011-09-10 DIAGNOSIS — Z5112 Encounter for antineoplastic immunotherapy: Secondary | ICD-10-CM

## 2011-09-10 LAB — CBC WITH DIFFERENTIAL/PLATELET
Eosinophils Absolute: 0 10*3/uL (ref 0.0–0.5)
HCT: 35.6 % (ref 34.8–46.6)
LYMPH%: 27 % (ref 14.0–49.7)
MCHC: 33.1 g/dL (ref 31.5–36.0)
MCV: 78.1 fL — ABNORMAL LOW (ref 79.5–101.0)
MONO#: 0.3 10*3/uL (ref 0.1–0.9)
MONO%: 7.2 % (ref 0.0–14.0)
NEUT#: 2.4 10*3/uL (ref 1.5–6.5)
NEUT%: 64.5 % (ref 38.4–76.8)
Platelets: 221 10*3/uL (ref 145–400)
RBC: 4.56 10*6/uL (ref 3.70–5.45)
WBC: 3.7 10*3/uL — ABNORMAL LOW (ref 3.9–10.3)

## 2011-09-10 LAB — COMPREHENSIVE METABOLIC PANEL
Alkaline Phosphatase: 47 U/L (ref 39–117)
CO2: 26 mEq/L (ref 19–32)
Creatinine, Ser: 0.76 mg/dL (ref 0.50–1.10)
Glucose, Bld: 91 mg/dL (ref 70–99)
Sodium: 143 mEq/L (ref 135–145)
Total Bilirubin: 0.2 mg/dL — ABNORMAL LOW (ref 0.3–1.2)

## 2011-09-10 MED ORDER — SODIUM CHLORIDE 0.9 % IJ SOLN
10.0000 mL | INTRAMUSCULAR | Status: DC | PRN
  Administered 2011-09-10: 10 mL
  Filled 2011-09-10: qty 10

## 2011-09-10 MED ORDER — HEPARIN SOD (PORK) LOCK FLUSH 100 UNIT/ML IV SOLN
500.0000 [IU] | Freq: Once | INTRAVENOUS | Status: AC | PRN
  Administered 2011-09-10: 500 [IU]
  Filled 2011-09-10: qty 5

## 2011-09-10 MED ORDER — TRASTUZUMAB CHEMO INJECTION 440 MG
6.0000 mg/kg | Freq: Once | INTRAVENOUS | Status: AC
  Administered 2011-09-10: 588 mg via INTRAVENOUS
  Filled 2011-09-10: qty 28

## 2011-09-10 MED ORDER — SODIUM CHLORIDE 0.9 % IV SOLN
Freq: Once | INTRAVENOUS | Status: AC
  Administered 2011-09-10: 11:00:00 via INTRAVENOUS

## 2011-09-10 MED ORDER — DIPHENHYDRAMINE HCL 25 MG PO CAPS
25.0000 mg | ORAL_CAPSULE | Freq: Once | ORAL | Status: AC
  Administered 2011-09-10: 25 mg via ORAL

## 2011-09-10 MED ORDER — ACETAMINOPHEN 325 MG PO TABS
650.0000 mg | ORAL_TABLET | Freq: Once | ORAL | Status: AC
  Administered 2011-09-10: 650 mg via ORAL

## 2011-09-10 NOTE — Telephone Encounter (Signed)
Pt is aware to pick up her appt calenmdars from [facility] the chemo scheduler in the tx room. Sent michelle an inbasket message regarding this

## 2011-09-10 NOTE — Progress Notes (Signed)
ID: [name]   DOB: April 21, 1956  MR#: [id number]  WUJ#:[id number]  HISTORY OF PRESENT ILLNESS: [name] is a 56 year old [place] woman referred by Dr. Clelia Croft for treatment of left breast cancer.  The patient underwent screening bilateral mammography July 04, 2009, at [facility] and this showed only a scattered fibroglandular densities. However, repeat screening exam Sep 29, 2010 showed a potential abnormality in the left breast, retroareolar region. The patient was recalled for additional views Oct 08, 2010 and Dr. Derinda Late was able to locate the nodular density noted on the screening exam. It measured approximately 8 mm. Ultrasound showed a second nodular density just lateral to the nipple areolar complex. In that area, there was what appears to be either a small cluster of cysts or perhaps a solid nodule. In particular, the nodule seen in the 3 o'clock position was felt to be most likely a fibroadenoma. The other area, however, required biopsy and this was performed October 26, 2010. The pathology from this procedure ([id number]) showed a high-grade invasive ductal carcinoma which was estrogen-receptor positive at 98%, progesterone-receptor positive at 13% with an elevated MIB-1 at 69% and no evidence of HER-2 amplification, the ratio by CISH being 1.32.  With this information the patient was referred for bilateral breast MRIs and these were performed October 30, 2010. In the left breast there was a 1.8 cm ill-defined area in the central breast associated with post biopsy changes. There were really no other suspicious areas in either breast and no bony abnormalities and no abnormal appearing or enlarged lymph nodes.  Patient underwent left lumpectomy and sentinel lymph node sampling December 10, 2010 ([id number]) for a 1.1-cm invasive ductal carcinoma, grade 3, with 0/2 sentinel lymph nodes involved and so T1c N0 (stage I); the tumor being estrogen receptor 98% and progesterone receptor 13% positive, with no  HER-2 amplification (by initial determination, but amplified on repeat with a ratio of 2.30); with an Oncotype DX score of 43 predicting a 29% risk of recurrence if she takes 5 years of tamoxifen. (Her-2 was negative on the Oncotype DX determination, which uses a completely different method).  Decision was made to proceed with adjuvant chemotherapy, specifically 4 cycles of q. three-week docetaxel and cyclophosphamide given along with trastuzumab, to be followed by radiation and antiestrogen therapy. The trastuzumab will be continued for one year.  INTERVAL HISTORY: [name] returns today for followup of her left breast carcinoma, due for her next every 3 week dose of trastuzumab.  She began on tamoxifen following her appointment in March, and has tolerated the medication very well with no notable side effects. Specifically, she is noted no significant hot flashes. No peripheral swelling or evidence of abnormal clotting. No change in vision. Her energy level is good.   REVIEW OF SYSTEMS: [name] is feeling well.  The sinusitis she mentioned at her last appointment here has cleared. She's had no additional sinus congestion, no runny nose, and no fevers, chills, or night sweats. No coughing, phlegm production, or increased shortness of breath.  [name] is eating and drinking well, and denies nausea or change in bowel habits. She tells me she is ready to start concentrating on her weight loss again. She does exercise regularly. She's had no unusual myalgias, arthralgias or bony pain.  Otherwise a detailed review of systems is noncontributory.  PAST MEDICAL HISTORY: Past Medical History  Diagnosis Date  . Hypertension   . Breast lump     left  . Gum disease   . Cancer   .  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 Diabetes mellitus     borderline  . Sleep apnea     PAST SURGICAL HISTORY: Past Surgical History  Procedure Date  . Laparoscopic gastric banding   . Foot  mass excision   . Soft tissue cyst excision     back cyst  . Vaginal mass excision   . Mass excision     back  . Portacath placement 02/11/11  . Resectoscopic polypectomy 2001    MYOMECTOMY  . Breast lumpectomy 2012    left    FAMILY HISTORY Family History  Problem Relation Age of Onset  . Cancer Maternal Uncle 70    prostate  . Alcohol abuse Father   . Cirrhosis Father   . Cancer Mother     breast   GYNECOLOGIC HISTORY: She had menarche at age 47, last menstrual period was late May 2012. Her periods however, are not regular and it had been almost a year before that. She has never used hormone replacement. She is GX P0.   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 She attends a [employer]. She goes to the gym at least three times a week.     ADVANCED DIRECTIVES:  HEALTH MAINTENANCE: History  Substance Use Topics  . Smoking status: Former Smoker -- 1.0 packs/day for 21 years    Types: Cigarettes    Quit date: 03/25/1999  . Smokeless tobacco: Never Used  . Alcohol Use: 0.6 oz/week    1 Glasses of wine per week     Colonoscopy:  PAP:  Bone density:  Lipid panel:  No Known Allergies  Current Outpatient Prescriptions  Medication Sig Dispense Refill  . amLODipine-olmesartan (AZOR) 10-40 MG per tablet Take 1 tablet by mouth daily.  30 tablet  6  . b complex vitamins tablet Take 1 tablet by mouth daily.        . Calcium Citrate-Vitamin D (CALCET CREAMY BITES PO) Take by mouth daily.       . hydrochlorothiazide (HYDRODIURIL) 25 MG tablet Take 25 mg by mouth daily.      [name] lidocaine-prilocaine (EMLA) cream Apply 1 application topically daily.       . multivitamin-iron-minerals-folic acid (CENTRUM) chewable tablet Chew 1 tablet by mouth daily.        . non-metallic deodorant Thornton Papas) MISC Apply 1 application topically daily as needed.      . sodium chloride 0.9 % SOLN 250 mL with trastuzumab 440 MG SOLR 2 mg/kg Inject 2 mg/kg into  the vein. Every three weeks...      . tamoxifen (NOLVADEX) 20 MG tablet       . Wound Cleansers (RADIAPLEX EX) Apply topically.      . fluticasone (FLONASE) 50 MCG/ACT nasal spray Place 1 spray into the nose 2 (two) times daily.  1 g  0  . DISCONTD: prochlorperazine (COMPAZINE) 10 MG tablet       . DISCONTD: triamterene-hydrochlorothiazide (DYAZIDE) 37.5-25 MG per capsule Take 1 each (1 capsule total) by mouth every morning.  30 capsule  0   No current facility-administered medications for this visit.   Facility-Administered Medications Ordered in Other Visits  Medication Dose Route Frequency Provider Last Rate Last Dose  . 0.9 %  sodium chloride infusion   Intravenous Once Filipe Greathouse G Malaak Stach, PA      . 0.9 %  sodium chloride infusion   Intravenous Once Angelgabriel Willmore  Allegra Grana, PA      . acetaminophen (TYLENOL) tablet 650 mg  650 mg Oral Once Catalina Gravel, PA   650 mg at 09/10/11 1057  . diphenhydrAMINE (BENADRYL) capsule 25 mg  25 mg Oral Once Catalina Gravel, PA   25 mg at 09/10/11 1057  . heparin lock flush 100 unit/mL  500 Units Intracatheter Once PRN Catalina Gravel, PA   500 Units at 09/10/11 1200  . sodium chloride 0.9 % injection 10 mL  10 mL Intracatheter PRN Catalina Gravel, PA   10 mL at 05/06/11 1721  . sodium chloride 0.9 % injection 10 mL  10 mL Intracatheter PRN Catalina Gravel, PA   10 mL at 09/10/11 1200  . trastuzumab (HERCEPTIN) 588 mg in sodium chloride 0.9 % 250 mL chemo infusion  6 mg/kg (Treatment Plan Actual) Intravenous Once Catalina Gravel, PA   588 mg at 09/10/11 1124    OBJECTIVE: Filed Vitals:   09/10/11 0940  BP: 132/84  Pulse: 77  Temp: 98.5 F (36.9 C)     Body mass index is 43.35 kg/(m^2).    ECOG FS: 0  Physical Exam: HEENT:  Sclerae anicteric, conjunctivae pink.  Oropharynx clear. Nodes:  No cervical, supraclavicular, or axillary lymphadenopathy palpated.  Breast Exam:  Right breast is benign, no masses, skin changes, or nipple inversion. Left breast is status post lumpectomy and  radiation. There is mild hyperpigmentation, but no suspicious skin changes. No suspicious nodularity, and no evidence of local recurrence.  Lungs:  Clear to auscultation bilaterally.  No crackles, rhonchi, or wheezes.   Heart:  Regular rate and rhythm.   Abdomen:  Soft, obese, nontender.  Positive bowel sounds.  No organomegaly or masses palpated.   Musculoskeletal:  No focal spinal tenderness to palpation.  Extremities:  Benign.  No peripheral edema or cyanosis.   Skin:  Benign.   Neuro:  Nonfocal.   LAB RESULTS: Lab Results  Component Value Date   WBC 3.7* 09/10/2011   NEUTROABS 2.4 09/10/2011   HGB 11.8 09/10/2011   HCT 35.6 09/10/2011   MCV 78.1* 09/10/2011   PLT 221 09/10/2011      Chemistry      Component Value Date/Time   NA 143 09/10/2011 0915   K 4.0 09/10/2011 0915   CL 107 09/10/2011 0915   CO2 26 09/10/2011 0915   BUN 20 09/10/2011 0915   CREATININE 0.76 09/10/2011 0915      Component Value Date/Time   CALCIUM 8.7 09/10/2011 0915   ALKPHOS 47 09/10/2011 0915   AST 14 09/10/2011 0915   ALT 11 09/10/2011 0915   BILITOT 0.2* 09/10/2011 0915       Lab Results  Component Value Date   LABCA2 27 11/04/2010    STUDIES: No results found.  ASSESSMENT: 56 year old [place] woman  1. Status post lumpectomy and sentinel lymph node sampling iAugust 2012 for a T1cN0, stage I invasive ductal carcinoma, grade 3, estrogen receptor 98% and progesterone receptor 13% positive, HER-2/neu amplified with a ratio of 2.3. Oncotype showed a recurrence score of 43 ( "high risk"), predicting a 29% risk of recurrence if the patient's only adjuvant treatment was tamoxifen  2. status post adjuvant docetaxel/ cyclophosphamide, completed December 2012  3. trastuzumab started 04/15/2011, to be continued to total one year.  4. radiation therapy, completed July 23, 2011 5. began on tamoxifen, 20 mg daily, in March 2013.  PLAN: [name] will proceed to treatment today as scheduled for her  next q. three-week dose of  trastuzumab. She will be continuing with trastuzumab through December of this year.  She will continue on tamoxifen which she is tolerating very well. She return for treatment alone in 3 weeks, and I will see her in 6 weeks for routine followup.  Patient voices understanding and agreement with our plan, and will call with any changes or problems.  Halton Neas    09/10/2011

## 2011-09-10 NOTE — Telephone Encounter (Signed)
Per staff message from [place], I have added treatment appts for the patient. Appts in computer and [place] aware.  JMW

## 2011-09-13 ENCOUNTER — Other Ambulatory Visit: Payer: Self-pay | Admitting: Physician Assistant

## 2011-09-13 ENCOUNTER — Telehealth: Payer: Self-pay | Admitting: *Deleted

## 2011-09-13 NOTE — Telephone Encounter (Signed)
Per staff message from [place], I have scheduled the appts for the patient. Appts in computer and [place] aware. JMW  

## 2011-09-22 ENCOUNTER — Ambulatory Visit (HOSPITAL_COMMUNITY)
Admission: RE | Admit: 2011-09-22 | Discharge: 2011-09-22 | Disposition: A | Discharge status: Home / Self Care | Payer: BC Managed Care – PPO | Source: Ambulatory Visit | Attending: Internal Medicine | Admitting: Internal Medicine

## 2011-09-22 ENCOUNTER — Ambulatory Visit (HOSPITAL_COMMUNITY)
Admission: RE | Admit: 2011-09-22 | Discharge: 2011-09-22 | Disposition: A | Discharge status: Home / Self Care | Payer: BC Managed Care – PPO | Source: Ambulatory Visit | Attending: Physician Assistant | Admitting: Physician Assistant

## 2011-09-22 VITALS — BP 136/72 | HR 73 | Wt 215.0 lb

## 2011-09-22 DIAGNOSIS — I517 Cardiomegaly: Secondary | ICD-10-CM

## 2011-09-22 DIAGNOSIS — I10 Essential (primary) hypertension: Secondary | ICD-10-CM | POA: Insufficient documentation

## 2011-09-22 DIAGNOSIS — C50919 Malignant neoplasm of unspecified site of unspecified female breast: Secondary | ICD-10-CM | POA: Insufficient documentation

## 2011-09-22 DIAGNOSIS — G4733 Obstructive sleep apnea (adult) (pediatric): Secondary | ICD-10-CM | POA: Insufficient documentation

## 2011-09-22 DIAGNOSIS — E669 Obesity, unspecified: Secondary | ICD-10-CM | POA: Insufficient documentation

## 2011-09-22 NOTE — Assessment & Plan Note (Addendum)
Reviewed today's echo in full with the patient, EF remains good.  Will continue herceptin therapy at this time with serial echos, q 3months.    Patient seen and examined with Ulyess Blossom, PA-C. We discussed all aspects of the encounter. I agree with the assessment and plan as stated above. I reviewed echo with her personally. EF and Doppler parameters are stable. Continue Herceptin.

## 2011-09-22 NOTE — Progress Notes (Signed)
Referring Physician: Dr. Darnelle Catalan  Primary Care: Dr. Clelia Croft   HPI:  Ms. Avakian is a 56 y.o. 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  She is status post left lumpectomy on 12/10/10.  She was started on adjuvant chemotherapy with (started 02/25/11)  4 cycles of q3 week docetaxel and cyclophosphamide along with trastuzumab.  Then to continue with trastuzumab for 1 year.  She has been referred to the cardio-onc clinic for follow up while on herceptin therapy.   Echo's: 02/18/11: EF 65-70, lateral s' velocity 10.0 (Hyperdynamic) 06/16/11: EF 55-60%, lateral s' velocity 9.8 (re-read as 65%) 09/22/11: EF 55-60% lateral s' velocity 9.7  She returns for routine follow up today.  Last visit her azor was increased 10/40 mg for better BP control.  SBP since that time running high 120s to mid 130s.  She is feeling good.  Tolerating herceptin therapy well.  She denies SOB/orhtopnea/PND.     Review of Systems: All pertinent positives and negatives as in HPI, otherwise negative.    Past Medical History  Diagnosis Date  . Hypertension   . Breast lump     left  . Gum disease   . Cancer   . 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 Diabetes mellitus     borderline  . Sleep apnea     Current Outpatient Prescriptions  Medication Sig Dispense Refill  . amLODipine-olmesartan (AZOR) 10-40 MG per tablet Take 1 tablet by mouth daily.  30 tablet  6  . b complex vitamins tablet Take 1 tablet by mouth daily.        . Calcium Citrate-Vitamin D (CALCET CREAMY BITES PO) Take by mouth daily.       . hydrochlorothiazide (HYDRODIURIL) 25 MG tablet Take 25 mg by mouth daily.      [name] lidocaine-prilocaine (EMLA) cream Apply 1 application topically daily.       . multivitamin-iron-minerals-folic acid (CENTRUM) chewable tablet Chew 1 tablet by mouth daily.        . sodium chloride 0.9 % SOLN 250 mL with trastuzumab 440 MG SOLR 2 mg/kg Inject 2 mg/kg into the vein. Every three  weeks...      . tamoxifen (NOLVADEX) 20 MG tablet Take 20 mg by mouth daily.       . Wound Cleansers (RADIAPLEX EX) Apply topically.      [name] DISCONTD: prochlorperazine (COMPAZINE) 10 MG tablet       . DISCONTD: triamterene-hydrochlorothiazide (DYAZIDE) 37.5-25 MG per capsule Take 1 each (1 capsule total) by mouth every morning.  30 capsule  0   No current facility-administered medications for this encounter.   Facility-Administered Medications Ordered in Other Encounters  Medication Dose Route Frequency Provider Last Rate Last Dose  . 0.9 %  sodium chloride infusion   Intravenous Once Amy Allegra Grana, PA      . sodium chloride 0.9 % injection 10 mL  10 mL Intracatheter PRN Catalina Gravel, PA   10 mL at 05/06/11 1721    No Known Allergies  PHYSICAL EXAM: Filed Vitals:   09/22/11 1413  BP: 136/72  Pulse: 73  Weight: 215 lb (97.523 kg)  SpO2: 100%   General:  Well appearing. No respiratory difficulty HEENT: normal Neck: supple. no JVD. Carotids 2+ bilat; no bruits. No lymphadenopathy or thryomegaly appreciated. Cor: PMI nondisplaced. Regular rate & rhythm. No rubs, gallops or murmurs. Lungs: clear Abdomen: obese, soft, nontender, nondistended. No hepatosplenomegaly.  No bruits or masses. Good bowel sounds. Extremities: no cyanosis, clubbing, rash, trace edema Neuro: alert & oriented x 3, cranial nerves grossly intact. moves all 4 extremities w/o difficulty. Affect pleasant.   ASSESSMENT & PLAN:

## 2011-09-22 NOTE — Patient Instructions (Signed)
Follow up 3 months with echo

## 2011-09-22 NOTE — Assessment & Plan Note (Signed)
Controlled with increased azor.  Will continue current regimen.

## 2011-09-22 NOTE — Progress Notes (Signed)
  Echocardiogram 2D Echocardiogram has been performed.  Cathie Beams Deneen 09/22/2011, 2:02 PM

## 2011-10-01 ENCOUNTER — Ambulatory Visit: Payer: BC Managed Care – PPO

## 2011-10-05 ENCOUNTER — Other Ambulatory Visit: Payer: BC Managed Care – PPO

## 2011-10-08 ENCOUNTER — Ambulatory Visit (HOSPITAL_BASED_OUTPATIENT_CLINIC_OR_DEPARTMENT_OTHER): Payer: BC Managed Care – PPO

## 2011-10-08 ENCOUNTER — Other Ambulatory Visit (HOSPITAL_BASED_OUTPATIENT_CLINIC_OR_DEPARTMENT_OTHER): Payer: BC Managed Care – PPO | Admitting: Lab

## 2011-10-08 VITALS — BP 130/75 | HR 70 | Temp 98.2°F

## 2011-10-08 DIAGNOSIS — Z853 Personal history of malignant neoplasm of breast: Secondary | ICD-10-CM

## 2011-10-08 DIAGNOSIS — C50119 Malignant neoplasm of central portion of unspecified female breast: Secondary | ICD-10-CM

## 2011-10-08 DIAGNOSIS — Z170 Estrogen receptor positive status [ER+]: Secondary | ICD-10-CM

## 2011-10-08 DIAGNOSIS — Z5112 Encounter for antineoplastic immunotherapy: Secondary | ICD-10-CM

## 2011-10-08 LAB — CBC WITH DIFFERENTIAL/PLATELET
Basophils Absolute: 0 10*3/uL (ref 0.0–0.1)
EOS%: 0.5 % (ref 0.0–7.0)
HGB: 12 g/dL (ref 11.6–15.9)
LYMPH%: 22 % (ref 14.0–49.7)
MCH: 25.9 pg (ref 25.1–34.0)
MCV: 78 fL — ABNORMAL LOW (ref 79.5–101.0)
MONO%: 8.4 % (ref 0.0–14.0)
NEUT%: 69.1 % (ref 38.4–76.8)
Platelets: 215 10*3/uL (ref 145–400)
RDW: 15.8 % — ABNORMAL HIGH (ref 11.2–14.5)

## 2011-10-08 MED ORDER — TRASTUZUMAB CHEMO INJECTION 440 MG
6.0000 mg/kg | Freq: Once | INTRAVENOUS | Status: AC
  Administered 2011-10-08: 588 mg via INTRAVENOUS
  Filled 2011-10-08: qty 28

## 2011-10-08 MED ORDER — DIPHENHYDRAMINE HCL 25 MG PO CAPS
25.0000 mg | ORAL_CAPSULE | Freq: Once | ORAL | Status: AC
  Administered 2011-10-08: 25 mg via ORAL

## 2011-10-08 MED ORDER — ACETAMINOPHEN 325 MG PO TABS
650.0000 mg | ORAL_TABLET | Freq: Once | ORAL | Status: AC
  Administered 2011-10-08: 650 mg via ORAL

## 2011-10-08 MED ORDER — SODIUM CHLORIDE 0.9 % IV SOLN
Freq: Once | INTRAVENOUS | Status: AC
  Administered 2011-10-08: 11:00:00 via INTRAVENOUS

## 2011-10-08 MED ORDER — SODIUM CHLORIDE 0.9 % IJ SOLN
10.0000 mL | INTRAMUSCULAR | Status: DC | PRN
  Administered 2011-10-08: 10 mL
  Filled 2011-10-08: qty 10

## 2011-10-08 MED ORDER — HEPARIN SOD (PORK) LOCK FLUSH 100 UNIT/ML IV SOLN
500.0000 [IU] | Freq: Once | INTRAVENOUS | Status: AC | PRN
  Administered 2011-10-08: 500 [IU]
  Filled 2011-10-08: qty 5

## 2011-10-08 NOTE — Patient Instructions (Signed)
[facility] Discharge Instructions for Patients Receiving Chemotherapy  Today you received the following chemotherapy agents  Herceptin    If you develop nausea and vomiting that is not controlled by your nausea medication, call the clinic. If it is after clinic hours your family physician or the after hours number for the clinic or go to the Emergency Department.   BELOW ARE SYMPTOMS THAT SHOULD BE REPORTED IMMEDIATELY:  *FEVER GREATER THAN 100.5 F  *CHILLS WITH OR WITHOUT FEVER  NAUSEA AND VOMITING THAT IS NOT CONTROLLED WITH YOUR NAUSEA MEDICATION  *UNUSUAL SHORTNESS OF BREATH  *UNUSUAL BRUISING OR BLEEDING  TENDERNESS IN MOUTH AND THROAT WITH OR WITHOUT PRESENCE OF ULCERS  *URINARY PROBLEMS  *BOWEL PROBLEMS  UNUSUAL RASH Items with * indicate a potential emergency and should be followed up as soon as possible.    I have been informed and understand all the instructions given to me. I know to contact the clinic, my physician, or go to the Emergency Department if any problems should occur. I do not have any questions at this time, but understand that I may call the clinic during office hours   should I have any questions or need assistance in obtaining follow up care.    __________________________________________  _____________  __________ Signature of Patient or Authorized Representative            Date                   Time    __________________________________________ Nurse's Signature

## 2011-10-22 ENCOUNTER — Ambulatory Visit: Payer: BC Managed Care – PPO

## 2011-10-28 ENCOUNTER — Telehealth: Payer: Self-pay | Admitting: Oncology

## 2011-10-28 ENCOUNTER — Ambulatory Visit (HOSPITAL_BASED_OUTPATIENT_CLINIC_OR_DEPARTMENT_OTHER): Payer: BC Managed Care – PPO | Admitting: Physician Assistant

## 2011-10-28 ENCOUNTER — Telehealth: Payer: Self-pay | Admitting: *Deleted

## 2011-10-28 ENCOUNTER — Ambulatory Visit (HOSPITAL_BASED_OUTPATIENT_CLINIC_OR_DEPARTMENT_OTHER): Payer: BC Managed Care – PPO

## 2011-10-28 ENCOUNTER — Encounter: Payer: Self-pay | Admitting: Physician Assistant

## 2011-10-28 ENCOUNTER — Other Ambulatory Visit (HOSPITAL_BASED_OUTPATIENT_CLINIC_OR_DEPARTMENT_OTHER): Payer: BC Managed Care – PPO | Admitting: Lab

## 2011-10-28 VITALS — BP 134/82 | HR 83 | Temp 98.4°F | Ht 59.2 in | Wt 217.6 lb

## 2011-10-28 DIAGNOSIS — C50119 Malignant neoplasm of central portion of unspecified female breast: Secondary | ICD-10-CM

## 2011-10-28 DIAGNOSIS — Z853 Personal history of malignant neoplasm of breast: Secondary | ICD-10-CM

## 2011-10-28 DIAGNOSIS — Z170 Estrogen receptor positive status [ER+]: Secondary | ICD-10-CM

## 2011-10-28 DIAGNOSIS — Z5112 Encounter for antineoplastic immunotherapy: Secondary | ICD-10-CM

## 2011-10-28 LAB — COMPREHENSIVE METABOLIC PANEL
ALT: 13 U/L (ref 0–35)
BUN: 17 mg/dL (ref 6–23)
CO2: 26 mEq/L (ref 19–32)
Calcium: 9 mg/dL (ref 8.4–10.5)
Chloride: 102 mEq/L (ref 96–112)
Creatinine, Ser: 0.75 mg/dL (ref 0.50–1.10)
Glucose, Bld: 100 mg/dL — ABNORMAL HIGH (ref 70–99)
Total Bilirubin: 0.3 mg/dL (ref 0.3–1.2)

## 2011-10-28 LAB — CBC WITH DIFFERENTIAL/PLATELET
Basophils Absolute: 0 10*3/uL (ref 0.0–0.1)
EOS%: 0.5 % (ref 0.0–7.0)
Eosinophils Absolute: 0 10*3/uL (ref 0.0–0.5)
HCT: 34.3 % — ABNORMAL LOW (ref 34.8–46.6)
HGB: 11.5 g/dL — ABNORMAL LOW (ref 11.6–15.9)
MONO#: 0.3 10*3/uL (ref 0.1–0.9)
MONO%: 8.4 % (ref 0.0–14.0)
NEUT#: 2.5 10*3/uL (ref 1.5–6.5)
Platelets: 237 10*3/uL (ref 145–400)
WBC: 3.8 10*3/uL — ABNORMAL LOW (ref 3.9–10.3)
lymph#: 1 10*3/uL (ref 0.9–3.3)

## 2011-10-28 MED ORDER — DIPHENHYDRAMINE HCL 25 MG PO CAPS
25.0000 mg | ORAL_CAPSULE | Freq: Once | ORAL | Status: AC
  Administered 2011-10-28: 25 mg via ORAL

## 2011-10-28 MED ORDER — SODIUM CHLORIDE 0.9 % IJ SOLN
10.0000 mL | INTRAMUSCULAR | Status: DC | PRN
  Administered 2011-10-28: 10 mL
  Filled 2011-10-28: qty 10

## 2011-10-28 MED ORDER — TRASTUZUMAB CHEMO INJECTION 440 MG
6.0000 mg/kg | Freq: Once | INTRAVENOUS | Status: AC
  Administered 2011-10-28: 588 mg via INTRAVENOUS
  Filled 2011-10-28: qty 28

## 2011-10-28 MED ORDER — SODIUM CHLORIDE 0.9 % IV SOLN
Freq: Once | INTRAVENOUS | Status: AC
  Administered 2011-10-28: 12:00:00 via INTRAVENOUS

## 2011-10-28 MED ORDER — HEPARIN SOD (PORK) LOCK FLUSH 100 UNIT/ML IV SOLN
500.0000 [IU] | Freq: Once | INTRAVENOUS | Status: AC | PRN
  Administered 2011-10-28: 500 [IU]
  Filled 2011-10-28: qty 5

## 2011-10-28 MED ORDER — ACETAMINOPHEN 325 MG PO TABS
650.0000 mg | ORAL_TABLET | Freq: Once | ORAL | Status: AC
  Administered 2011-10-28: 650 mg via ORAL

## 2011-10-28 NOTE — Telephone Encounter (Signed)
Per staff message I have scheudled appts. JW

## 2011-10-28 NOTE — Telephone Encounter (Signed)
gve the pt her July 2013 appt calendar along with the mammo appt

## 2011-10-28 NOTE — Progress Notes (Signed)
ID: SHERRICE CREEKMORE   DOB: 23-Aug-1955  MR#: [id number]  WGN#:[id number]  HISTORY OF PRESENT ILLNESS: Talasia Saulter is a 56 year old [place] woman referred by Dr. Clelia Croft for treatment of left breast cancer.  The patient underwent screening bilateral mammography July 04, 2009, at [facility] and this showed only a scattered fibroglandular densities. However, repeat screening exam Sep 29, 2010 showed a potential abnormality in the left breast, retroareolar region. The patient was recalled for additional views Oct 08, 2010 and Dr. Derinda Late was able to locate the nodular density noted on the screening exam. It measured approximately 8 mm. Ultrasound showed a second nodular density just lateral to the nipple areolar complex. In that area, there was what appears to be either a small cluster of cysts or perhaps a solid nodule. In particular, the nodule seen in the 3 o'clock position was felt to be most likely a fibroadenoma. The other area, however, required biopsy and this was performed October 26, 2010. The pathology from this procedure ([id number]) showed a high-grade invasive ductal carcinoma which was estrogen-receptor positive at 98%, progesterone-receptor positive at 13% with an elevated MIB-1 at 69% and no evidence of HER-2 amplification, the ratio by CISH being 1.32.  With this information the patient was referred for bilateral breast MRIs and these were performed October 30, 2010. In the left breast there was a 1.8 cm ill-defined area in the central breast associated with post biopsy changes. There were really no other suspicious areas in either breast and no bony abnormalities and no abnormal appearing or enlarged lymph nodes.  Patient underwent left lumpectomy and sentinel lymph node sampling December 10, 2010 ([id number]) for a 1.1-cm invasive ductal carcinoma, grade 3, with 0/2 sentinel lymph nodes involved and so T1c N0 (stage I); the tumor being estrogen receptor 98% and progesterone receptor 13% positive, with no  HER-2 amplification (by initial determination, but amplified on repeat with a ratio of 2.30); with an Oncotype DX score of 43 predicting a 29% risk of recurrence if she takes 5 years of tamoxifen. (Her-2 was negative on the Oncotype DX determination, which uses a completely different method).  Decision was made to proceed with adjuvant chemotherapy, specifically 4 cycles of q. three-week docetaxel and cyclophosphamide given along with trastuzumab, to be followed by radiation and antiestrogen therapy. The trastuzumab will be continued for one year.  INTERVAL HISTORY: Allayna returns today for followup of her left breast carcinoma, due for her next every 3 week dose of trastuzumab.  She also continues on tamoxifen, 20 mg daily, with no significant side effects.  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 She is planning a couple of beach trips, and overall is enjoying herself.   REVIEW OF SYSTEMS: Lacy is feeling well.  Her energy level is good. She has had no fevers, chills, or night sweats, and no significant hot flashes. No peripheral swelling or evidence of abnormal clotting. She does have some occasional cramping in various muscles. This comes and goes. She's had no additional pain otherwise. No abnormal headaches or change in vision. No nausea or change in bowel habits. No coughing, phlegm production, or increased shortness of breath.  Otherwise a detailed review of systems is noncontributory.  PAST MEDICAL HISTORY: Past Medical History  Diagnosis Date  . Hypertension   . Breast lump     left  . Gum disease   . Cancer   . Breast cancer  stage I high grade invasive ductal ca left breast  . Diabetes mellitus     borderline  . Sleep apnea     PAST SURGICAL HISTORY: Past Surgical History  Procedure Date  . Laparoscopic gastric banding   . Foot mass excision   . Soft tissue cyst excision     back  cyst  . Vaginal mass excision   . Mass excision     back  . Portacath placement 02/11/11  . Resectoscopic polypectomy 2001    MYOMECTOMY  . Breast lumpectomy 2012    left    FAMILY HISTORY Family History  Problem Relation Age of Onset  . Cancer Maternal Uncle 70    prostate  . Alcohol abuse Father   . Cirrhosis Father   . Cancer Mother     breast   GYNECOLOGIC HISTORY: She had menarche at age 39, last menstrual period was late May 2012. Her periods however, are not regular and it had been almost a year before that. She has never used hormone replacement. She is GX P0.   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 She attends a [employer]. She goes to the gym at least three times a week.     ADVANCED DIRECTIVES:  HEALTH MAINTENANCE: History  Substance Use Topics  . Smoking status: Former Smoker -- 1.0 packs/day for 21 years    Types: Cigarettes    Quit date: 03/25/1999  . Smokeless tobacco: Never Used  . Alcohol Use: 0.6 oz/week    1 Glasses of wine per week     Colonoscopy:  PAP:  Bone density:  Lipid panel:  No Known Allergies  Current Outpatient Prescriptions  Medication Sig Dispense Refill  . amLODipine-olmesartan (AZOR) 10-40 MG per tablet Take 1 tablet by mouth daily.  30 tablet  6  . b complex vitamins tablet Take 1 tablet by mouth daily.        . Calcium Citrate-Vitamin D (CALCET CREAMY BITES PO) Take by mouth daily.       . hydrochlorothiazide (HYDRODIURIL) 25 MG tablet Take 25 mg by mouth daily.      [name] lidocaine-prilocaine (EMLA) cream Apply 1 application topically daily.       . multivitamin-iron-minerals-folic acid (CENTRUM) chewable tablet Chew 1 tablet by mouth daily.        . sodium chloride 0.9 % SOLN 250 mL with trastuzumab 440 MG SOLR 2 mg/kg Inject 2 mg/kg into the vein. Every three weeks...      . tamoxifen (NOLVADEX) 20 MG tablet Take 20 mg by mouth daily.       . Wound Cleansers (RADIAPLEX EX) Apply  topically.      [name] DISCONTD: prochlorperazine (COMPAZINE) 10 MG tablet       . DISCONTD: triamterene-hydrochlorothiazide (DYAZIDE) 37.5-25 MG per capsule Take 1 each (1 capsule total) by mouth every morning.  30 capsule  0   No current facility-administered medications for this visit.   Facility-Administered Medications Ordered in Other Visits  Medication Dose Route Frequency Provider Last Rate Last Dose  . 0.9 %  sodium chloride infusion   Intravenous Once Mei Suits Allegra Grana, PA      . sodium chloride 0.9 % injection 10 mL  10 mL Intracatheter PRN Catalina Gravel, PA   10 mL at 05/06/11 1721    OBJECTIVE: Middle-aged [place] American female who appears comfortable and in no acute distress. Filed Vitals:   10/28/11 1038  BP: 134/82  Pulse: 83  Temp: 98.4 F (36.9 C)     Body mass index is 43.65 kg/(m^2).    ECOG FS: 0 Filed Weights   10/28/11 1038  Weight: 217 lb 9.6 oz (98.703 kg)   Physical Exam: HEENT:  Sclerae anicteric, conjunctivae pink.  Oropharynx clear. Nodes:  No cervical, supraclavicular, or axillary lymphadenopathy palpated.  Breast Exam:  Right breast is benign, no masses, skin changes, or nipple inversion. Left breast is status post lumpectomy and radiation. There is mild hyperpigmentation, but no suspicious skin changes. No suspicious nodularity, and no evidence of local recurrence.  Lungs:  Clear to auscultation bilaterally.  No crackles, rhonchi, or wheezes.   Heart:  Regular rate and rhythm.  No gallops or murmurs. Abdomen:  Soft, obese, nontender.  Positive bowel sounds.  No organomegaly or masses palpated.   Musculoskeletal:  No focal spinal tenderness to palpation.  Extremities:  Benign.  No peripheral edema or cyanosis.   Skin:  Benign.   Neuro:  Nonfocal. Alert and oriented x3.   LAB RESULTS: Lab Results  Component Value Date   WBC 3.8* 10/28/2011   NEUTROABS 2.5 10/28/2011   HGB 11.5* 10/28/2011   HCT 34.3* 10/28/2011   MCV 77.6* 10/28/2011   PLT 237 10/28/2011       Chemistry      Component Value Date/Time   NA 143 09/10/2011 0915   K 4.0 09/10/2011 0915   CL 107 09/10/2011 0915   CO2 26 09/10/2011 0915   BUN 20 09/10/2011 0915   CREATININE 0.76 09/10/2011 0915      Component Value Date/Time   CALCIUM 8.7 09/10/2011 0915   ALKPHOS 47 09/10/2011 0915   AST 14 09/10/2011 0915   ALT 11 09/10/2011 0915   BILITOT 0.2* 09/10/2011 0915       Lab Results  Component Value Date   LABCA2 27 11/04/2010    STUDIES: No results found.  ASSESSMENT: 56 year old [place] woman  1. Status post lumpectomy and sentinel lymph node sampling iAugust 2012 for a T1cN0, stage I invasive ductal carcinoma, grade 3, estrogen receptor 98% and progesterone receptor 13% positive, HER-2/neu amplified with a ratio of 2.3. Oncotype showed a recurrence score of 43 ( "high risk"), predicting a 29% risk of recurrence if the patient's only adjuvant treatment was tamoxifen  2. status post adjuvant docetaxel/ cyclophosphamide, completed December 2012  3. trastuzumab started 04/15/2011, to be continued to total one year.  4. radiation therapy, completed July 23, 2011 5. began on tamoxifen, 20 mg daily, in March 2013.  PLAN: Laurel will proceed to treatment today as scheduled for her next q. three-week dose of trastuzumab. She will be continuing with trastuzumab through December of this year.  She scheduled out through September, to receive trastuzumab every 3 weeks. I will see her in September for followup visit.   In the meanwhile, Magdalyn will continue on tamoxifen which she is tolerating very well. We're also scheduling her for her next annual mammogram, next available appointment, at [facility]  Patient voices understanding and agreement with our plan, and will call with any changes or problems.  Domonik Levario    10/28/2011

## 2011-11-12 ENCOUNTER — Other Ambulatory Visit: Payer: BC Managed Care – PPO | Admitting: Lab

## 2011-11-12 ENCOUNTER — Ambulatory Visit: Payer: BC Managed Care – PPO

## 2011-11-18 ENCOUNTER — Other Ambulatory Visit (HOSPITAL_BASED_OUTPATIENT_CLINIC_OR_DEPARTMENT_OTHER): Payer: BC Managed Care – PPO | Admitting: Lab

## 2011-11-18 ENCOUNTER — Ambulatory Visit (HOSPITAL_BASED_OUTPATIENT_CLINIC_OR_DEPARTMENT_OTHER): Payer: BC Managed Care – PPO

## 2011-11-18 VITALS — BP 123/83 | HR 78 | Temp 98.3°F

## 2011-11-18 DIAGNOSIS — Z853 Personal history of malignant neoplasm of breast: Secondary | ICD-10-CM

## 2011-11-18 DIAGNOSIS — Z5112 Encounter for antineoplastic immunotherapy: Secondary | ICD-10-CM

## 2011-11-18 DIAGNOSIS — C50119 Malignant neoplasm of central portion of unspecified female breast: Secondary | ICD-10-CM

## 2011-11-18 LAB — CBC WITH DIFFERENTIAL/PLATELET
Eosinophils Absolute: 0 10*3/uL (ref 0.0–0.5)
MCV: 79.4 fL — ABNORMAL LOW (ref 79.5–101.0)
MONO#: 0.3 10*3/uL (ref 0.1–0.9)
MONO%: 8 % (ref 0.0–14.0)
NEUT#: 2.1 10*3/uL (ref 1.5–6.5)
RBC: 4.27 10*6/uL (ref 3.70–5.45)
RDW: 15.5 % — ABNORMAL HIGH (ref 11.2–14.5)
WBC: 3.5 10*3/uL — ABNORMAL LOW (ref 3.9–10.3)
nRBC: 0 % (ref 0–0)

## 2011-11-18 MED ORDER — TRASTUZUMAB CHEMO INJECTION 440 MG
6.0000 mg/kg | Freq: Once | INTRAVENOUS | Status: AC
  Administered 2011-11-18: 588 mg via INTRAVENOUS
  Filled 2011-11-18: qty 28

## 2011-11-18 MED ORDER — SODIUM CHLORIDE 0.9 % IJ SOLN
10.0000 mL | INTRAMUSCULAR | Status: DC | PRN
  Administered 2011-11-18: 10 mL
  Filled 2011-11-18: qty 10

## 2011-11-18 MED ORDER — ACETAMINOPHEN 325 MG PO TABS
650.0000 mg | ORAL_TABLET | Freq: Once | ORAL | Status: AC
  Administered 2011-11-18: 650 mg via ORAL

## 2011-11-18 MED ORDER — DIPHENHYDRAMINE HCL 25 MG PO CAPS
25.0000 mg | ORAL_CAPSULE | Freq: Once | ORAL | Status: AC
  Administered 2011-11-18: 25 mg via ORAL

## 2011-11-18 MED ORDER — HEPARIN SOD (PORK) LOCK FLUSH 100 UNIT/ML IV SOLN
500.0000 [IU] | Freq: Once | INTRAVENOUS | Status: AC | PRN
  Administered 2011-11-18: 500 [IU]
  Filled 2011-11-18: qty 5

## 2011-11-18 MED ORDER — SODIUM CHLORIDE 0.9 % IV SOLN
Freq: Once | INTRAVENOUS | Status: AC
  Administered 2011-11-18: 09:00:00 via INTRAVENOUS

## 2011-11-18 NOTE — Patient Instructions (Signed)
Cancer Center Discharge Instructions for Patients Receiving Chemotherapy  Today you received the following chemotherapy agents Herceptin  To help prevent nausea and vomiting after your treatment, we encourage you to take your nausea medication Begin taking it at 7 pm and take it as often as prescribed for the next 24 to 72 hours.   If you develop nausea and vomiting that is not controlled by your nausea medication, call the clinic. If it is after clinic hours your family physician or the after hours number for the clinic or go to the Emergency Department.   BELOW ARE SYMPTOMS THAT SHOULD BE REPORTED IMMEDIATELY:  *FEVER GREATER THAN 100.5 F  *CHILLS WITH OR WITHOUT FEVER  NAUSEA AND VOMITING THAT IS NOT CONTROLLED WITH YOUR NAUSEA MEDICATION  *UNUSUAL SHORTNESS OF BREATH  *UNUSUAL BRUISING OR BLEEDING  TENDERNESS IN MOUTH AND THROAT WITH OR WITHOUT PRESENCE OF ULCERS  *URINARY PROBLEMS  *BOWEL PROBLEMS  UNUSUAL RASH Items with * indicate a potential emergency and should be followed up as soon as possible.  One of the nurses will contact you 24 hours after your treatment. Please let the nurse know about any problems that you may have experienced. Feel free to call the clinic you have any questions or concerns. The clinic phone number is [phone].   I have been informed and understand all the instructions given to me. I know to contact the clinic, my physician, or go to the Emergency Department if any problems should occur. I do not have any questions at this time, but understand that I may call the clinic during office hours   should I have any questions or need assistance in obtaining follow up care.    __________________________________________  _____________  __________ Signature of Patient or Authorized Representative            Date                   Time    __________________________________________ Nurse's Signature    

## 2011-11-19 ENCOUNTER — Other Ambulatory Visit: Payer: BC Managed Care – PPO | Admitting: Lab

## 2011-11-19 ENCOUNTER — Ambulatory Visit: Payer: BC Managed Care – PPO

## 2011-12-03 ENCOUNTER — Ambulatory Visit: Payer: BC Managed Care – PPO

## 2011-12-10 ENCOUNTER — Ambulatory Visit (HOSPITAL_BASED_OUTPATIENT_CLINIC_OR_DEPARTMENT_OTHER): Payer: BC Managed Care – PPO

## 2011-12-10 ENCOUNTER — Other Ambulatory Visit (HOSPITAL_BASED_OUTPATIENT_CLINIC_OR_DEPARTMENT_OTHER): Payer: BC Managed Care – PPO | Admitting: Lab

## 2011-12-10 VITALS — BP 111/69 | HR 82 | Temp 97.6°F | Resp 18

## 2011-12-10 DIAGNOSIS — C50119 Malignant neoplasm of central portion of unspecified female breast: Secondary | ICD-10-CM

## 2011-12-10 DIAGNOSIS — Z853 Personal history of malignant neoplasm of breast: Secondary | ICD-10-CM

## 2011-12-10 DIAGNOSIS — Z5112 Encounter for antineoplastic immunotherapy: Secondary | ICD-10-CM

## 2011-12-10 LAB — CBC WITH DIFFERENTIAL/PLATELET
BASO%: 0.3 % (ref 0.0–2.0)
HCT: 34.3 % — ABNORMAL LOW (ref 34.8–46.6)
MCHC: 33.2 g/dL (ref 31.5–36.0)
MONO#: 0.2 10*3/uL (ref 0.1–0.9)
NEUT%: 62.5 % (ref 38.4–76.8)
RBC: 4.21 10*6/uL (ref 3.70–5.45)
RDW: 14.9 % — ABNORMAL HIGH (ref 11.2–14.5)
WBC: 4 10*3/uL (ref 3.9–10.3)
lymph#: 1.2 10*3/uL (ref 0.9–3.3)
nRBC: 0 % (ref 0–0)

## 2011-12-10 MED ORDER — TRASTUZUMAB CHEMO INJECTION 440 MG
6.0000 mg/kg | Freq: Once | INTRAVENOUS | Status: AC
  Administered 2011-12-10: 588 mg via INTRAVENOUS
  Filled 2011-12-10: qty 28

## 2011-12-10 MED ORDER — ACETAMINOPHEN 325 MG PO TABS
650.0000 mg | ORAL_TABLET | Freq: Once | ORAL | Status: AC
  Administered 2011-12-10: 650 mg via ORAL

## 2011-12-10 MED ORDER — SODIUM CHLORIDE 0.9 % IJ SOLN
10.0000 mL | INTRAMUSCULAR | Status: DC | PRN
  Administered 2011-12-10: 10 mL
  Filled 2011-12-10: qty 10

## 2011-12-10 MED ORDER — HEPARIN SOD (PORK) LOCK FLUSH 100 UNIT/ML IV SOLN
500.0000 [IU] | Freq: Once | INTRAVENOUS | Status: AC | PRN
  Administered 2011-12-10: 500 [IU]
  Filled 2011-12-10: qty 5

## 2011-12-10 MED ORDER — SODIUM CHLORIDE 0.9 % IV SOLN
Freq: Once | INTRAVENOUS | Status: AC
  Administered 2011-12-10: 12:00:00 via INTRAVENOUS

## 2011-12-10 MED ORDER — DIPHENHYDRAMINE HCL 25 MG PO CAPS
25.0000 mg | ORAL_CAPSULE | Freq: Once | ORAL | Status: AC
  Administered 2011-12-10: 25 mg via ORAL

## 2011-12-10 NOTE — Patient Instructions (Signed)
[facility] Discharge Instructions for Patients Receiving Chemotherapy  Today you received the following chemotherapy agents Herceptin To help prevent nausea and vomiting after your treatment, we encourage you to take your nausea medication as prescribed.  If you develop nausea and vomiting that is not controlled by your nausea medication, call the clinic. If it is after clinic hours your family physician or the after hours number for the clinic or go to the Emergency Department.   BELOW ARE SYMPTOMS THAT SHOULD BE REPORTED IMMEDIATELY:  *FEVER GREATER THAN 100.5 F  *CHILLS WITH OR WITHOUT FEVER  NAUSEA AND VOMITING THAT IS NOT CONTROLLED WITH YOUR NAUSEA MEDICATION  *UNUSUAL SHORTNESS OF BREATH  *UNUSUAL BRUISING OR BLEEDING  TENDERNESS IN MOUTH AND THROAT WITH OR WITHOUT PRESENCE OF ULCERS  *URINARY PROBLEMS  *BOWEL PROBLEMS  UNUSUAL RASH Items with * indicate a potential emergency and should be followed up as soon as possible.  One of the nurses will contact you 24 hours after your treatment. Please let the nurse know about any problems that you may have experienced. Feel free to call the clinic you have any questions or concerns. The clinic phone number is [phone].   I have been informed and understand all the instructions given to me. I know to contact the clinic, my physician, or go to the Emergency Department if any problems should occur. I do not have any questions at this time, but understand that I may call the clinic during office hours   should I have any questions or need assistance in obtaining follow up care.    __________________________________________  _____________  __________ Signature of Patient or Authorized Representative            Date                   Time    __________________________________________ Nurse's Signature    

## 2011-12-31 ENCOUNTER — Ambulatory Visit (HOSPITAL_BASED_OUTPATIENT_CLINIC_OR_DEPARTMENT_OTHER): Payer: BC Managed Care – PPO

## 2011-12-31 ENCOUNTER — Other Ambulatory Visit (HOSPITAL_BASED_OUTPATIENT_CLINIC_OR_DEPARTMENT_OTHER): Payer: BC Managed Care – PPO | Admitting: Lab

## 2011-12-31 VITALS — BP 134/75 | HR 89 | Temp 98.9°F | Resp 18

## 2011-12-31 DIAGNOSIS — C50119 Malignant neoplasm of central portion of unspecified female breast: Secondary | ICD-10-CM

## 2011-12-31 DIAGNOSIS — Z853 Personal history of malignant neoplasm of breast: Secondary | ICD-10-CM

## 2011-12-31 DIAGNOSIS — Z5112 Encounter for antineoplastic immunotherapy: Secondary | ICD-10-CM

## 2011-12-31 LAB — COMPREHENSIVE METABOLIC PANEL
ALT: 11 U/L (ref 0–35)
Albumin: 3.9 g/dL (ref 3.5–5.2)
CO2: 26 mEq/L (ref 19–32)
Glucose, Bld: 113 mg/dL — ABNORMAL HIGH (ref 70–99)
Potassium: 3.8 mEq/L (ref 3.5–5.3)
Sodium: 140 mEq/L (ref 135–145)
Total Protein: 6.7 g/dL (ref 6.0–8.3)

## 2011-12-31 LAB — CBC WITH DIFFERENTIAL/PLATELET
Basophils Absolute: 0 10*3/uL (ref 0.0–0.1)
Eosinophils Absolute: 0 10*3/uL (ref 0.0–0.5)
HGB: 11.6 g/dL (ref 11.6–15.9)
MONO#: 0.2 10*3/uL (ref 0.1–0.9)
NEUT#: 2.3 10*3/uL (ref 1.5–6.5)
RDW: 13.9 % (ref 11.2–14.5)
WBC: 3.6 10*3/uL — ABNORMAL LOW (ref 3.9–10.3)
lymph#: 1.1 10*3/uL (ref 0.9–3.3)
nRBC: 0 % (ref 0–0)

## 2011-12-31 MED ORDER — SODIUM CHLORIDE 0.9 % IV SOLN
Freq: Once | INTRAVENOUS | Status: AC
  Administered 2011-12-31: 11:00:00 via INTRAVENOUS

## 2011-12-31 MED ORDER — ACETAMINOPHEN 325 MG PO TABS
650.0000 mg | ORAL_TABLET | Freq: Once | ORAL | Status: AC
  Administered 2011-12-31: 650 mg via ORAL

## 2011-12-31 MED ORDER — SODIUM CHLORIDE 0.9 % IJ SOLN
10.0000 mL | INTRAMUSCULAR | Status: DC | PRN
  Administered 2011-12-31: 10 mL
  Filled 2011-12-31: qty 10

## 2011-12-31 MED ORDER — HEPARIN SOD (PORK) LOCK FLUSH 100 UNIT/ML IV SOLN
500.0000 [IU] | Freq: Once | INTRAVENOUS | Status: AC | PRN
  Administered 2011-12-31: 500 [IU]
  Filled 2011-12-31: qty 5

## 2011-12-31 MED ORDER — TRASTUZUMAB CHEMO INJECTION 440 MG
6.0000 mg/kg | Freq: Once | INTRAVENOUS | Status: AC
  Administered 2011-12-31: 588 mg via INTRAVENOUS
  Filled 2011-12-31: qty 28

## 2011-12-31 MED ORDER — DIPHENHYDRAMINE HCL 25 MG PO CAPS
25.0000 mg | ORAL_CAPSULE | Freq: Once | ORAL | Status: AC
  Administered 2011-12-31: 25 mg via ORAL

## 2011-12-31 NOTE — Patient Instructions (Signed)
[facility] Discharge Instructions for Patients Receiving Chemotherapy  Today you received the following chemotherapy agents Herceptin To help prevent nausea and vomiting after your treatment, we encourage you to take your nausea medication as prescribed.  If you develop nausea and vomiting that is not controlled by your nausea medication, call the clinic. If it is after clinic hours your family physician or the after hours number for the clinic or go to the Emergency Department.   BELOW ARE SYMPTOMS THAT SHOULD BE REPORTED IMMEDIATELY:  *FEVER GREATER THAN 100.5 F  *CHILLS WITH OR WITHOUT FEVER  NAUSEA AND VOMITING THAT IS NOT CONTROLLED WITH YOUR NAUSEA MEDICATION  *UNUSUAL SHORTNESS OF BREATH  *UNUSUAL BRUISING OR BLEEDING  TENDERNESS IN MOUTH AND THROAT WITH OR WITHOUT PRESENCE OF ULCERS  *URINARY PROBLEMS  *BOWEL PROBLEMS  UNUSUAL RASH Items with * indicate a potential emergency and should be followed up as soon as possible.  One of the nurses will contact you 24 hours after your treatment. Please let the nurse know about any problems that you may have experienced. Feel free to call the clinic you have any questions or concerns. The clinic phone number is [phone].   I have been informed and understand all the instructions given to me. I know to contact the clinic, my physician, or go to the Emergency Department if any problems should occur. I do not have any questions at this time, but understand that I may call the clinic during office hours   should I have any questions or need assistance in obtaining follow up care.    __________________________________________  _____________  __________ Signature of Patient or Authorized Representative            Date                   Time    __________________________________________ Nurse's Signature    

## 2012-01-21 ENCOUNTER — Ambulatory Visit (HOSPITAL_BASED_OUTPATIENT_CLINIC_OR_DEPARTMENT_OTHER): Payer: BC Managed Care – PPO

## 2012-01-21 ENCOUNTER — Other Ambulatory Visit (HOSPITAL_BASED_OUTPATIENT_CLINIC_OR_DEPARTMENT_OTHER): Payer: BC Managed Care – PPO | Admitting: Lab

## 2012-01-21 VITALS — BP 140/85 | HR 86 | Temp 98.5°F | Resp 20

## 2012-01-21 DIAGNOSIS — C50119 Malignant neoplasm of central portion of unspecified female breast: Secondary | ICD-10-CM

## 2012-01-21 DIAGNOSIS — Z853 Personal history of malignant neoplasm of breast: Secondary | ICD-10-CM

## 2012-01-21 DIAGNOSIS — Z5112 Encounter for antineoplastic immunotherapy: Secondary | ICD-10-CM

## 2012-01-21 LAB — CBC WITH DIFFERENTIAL/PLATELET
Eosinophils Absolute: 0 10*3/uL (ref 0.0–0.5)
LYMPH%: 31.2 % (ref 14.0–49.7)
MCH: 26.9 pg (ref 25.1–34.0)
MCV: 81.8 fL (ref 79.5–101.0)
MONO%: 6.3 % (ref 0.0–14.0)
NEUT#: 2.2 10*3/uL (ref 1.5–6.5)
Platelets: 222 10*3/uL (ref 145–400)
RBC: 4.28 10*6/uL (ref 3.70–5.45)
nRBC: 0 % (ref 0–0)

## 2012-01-21 LAB — COMPREHENSIVE METABOLIC PANEL (CC13)
CO2: 23 mEq/L (ref 22–29)
Creatinine: 0.8 mg/dL (ref 0.6–1.1)
Glucose: 85 mg/dl (ref 70–99)
Total Bilirubin: 0.4 mg/dL (ref 0.20–1.20)

## 2012-01-21 MED ORDER — HEPARIN SOD (PORK) LOCK FLUSH 100 UNIT/ML IV SOLN
500.0000 [IU] | Freq: Once | INTRAVENOUS | Status: AC | PRN
  Administered 2012-01-21: 500 [IU]
  Filled 2012-01-21: qty 5

## 2012-01-21 MED ORDER — ACETAMINOPHEN 325 MG PO TABS
650.0000 mg | ORAL_TABLET | Freq: Once | ORAL | Status: AC
  Administered 2012-01-21: 650 mg via ORAL

## 2012-01-21 MED ORDER — TRASTUZUMAB CHEMO INJECTION 440 MG
6.0000 mg/kg | Freq: Once | INTRAVENOUS | Status: AC
  Administered 2012-01-21: 588 mg via INTRAVENOUS
  Filled 2012-01-21: qty 28

## 2012-01-21 MED ORDER — DIPHENHYDRAMINE HCL 25 MG PO CAPS
25.0000 mg | ORAL_CAPSULE | Freq: Once | ORAL | Status: AC
  Administered 2012-01-21: 25 mg via ORAL

## 2012-01-21 MED ORDER — SODIUM CHLORIDE 0.9 % IV SOLN
Freq: Once | INTRAVENOUS | Status: AC
  Administered 2012-01-21: 11:00:00 via INTRAVENOUS

## 2012-01-21 MED ORDER — SODIUM CHLORIDE 0.9 % IJ SOLN
10.0000 mL | INTRAMUSCULAR | Status: DC | PRN
  Administered 2012-01-21: 10 mL
  Filled 2012-01-21: qty 10

## 2012-01-21 NOTE — Patient Instructions (Signed)
Patient aware of next appointment; discharged home with no complaints. 

## 2012-01-21 NOTE — Progress Notes (Signed)
Patient states that she has d/c'ed taking Tamoxifen and HCTZ  X 7 days; states that she had been having severe leg, arm, feet, ankle cramps; since discontinuing medications, patient states that she has not had any cramps; message left with desk nurse for follow-up.

## 2012-01-27 ENCOUNTER — Encounter (HOSPITAL_COMMUNITY): Payer: Self-pay | Admitting: Cardiology

## 2012-01-28 ENCOUNTER — Telehealth: Payer: Self-pay | Admitting: *Deleted

## 2012-01-28 ENCOUNTER — Encounter: Payer: Self-pay | Admitting: Physician Assistant

## 2012-01-28 ENCOUNTER — Ambulatory Visit (HOSPITAL_BASED_OUTPATIENT_CLINIC_OR_DEPARTMENT_OTHER): Payer: BC Managed Care – PPO | Admitting: Physician Assistant

## 2012-01-28 VITALS — BP 136/89 | HR 88 | Temp 99.1°F | Resp 20 | Ht 59.0 in | Wt 219.3 lb

## 2012-01-28 DIAGNOSIS — C50919 Malignant neoplasm of unspecified site of unspecified female breast: Secondary | ICD-10-CM

## 2012-01-28 DIAGNOSIS — Z170 Estrogen receptor positive status [ER+]: Secondary | ICD-10-CM

## 2012-01-28 DIAGNOSIS — R079 Chest pain, unspecified: Secondary | ICD-10-CM

## 2012-01-28 NOTE — Progress Notes (Signed)
ID: [name]   DOB: 06-09-1955  MR#: [id number]  PIR#:[id number]  HISTORY OF PRESENT ILLNESS: [name] is a 56 year old [place] woman referred by Dr. Clelia Croft for treatment of left breast cancer.  The patient underwent screening bilateral mammography July 04, 2009, at [facility] and this showed only a scattered fibroglandular densities. However, repeat screening exam Sep 29, 2010 showed a potential abnormality in the left breast, retroareolar region. The patient was recalled for additional views Oct 08, 2010 and Dr. Derinda Late was able to locate the nodular density noted on the screening exam. It measured approximately 8 mm. Ultrasound showed a second nodular density just lateral to the nipple areolar complex. In that area, there was what appears to be either a small cluster of cysts or perhaps a solid nodule. In particular, the nodule seen in the 3 o'clock position was felt to be most likely a fibroadenoma. The other area, however, required biopsy and this was performed October 26, 2010. The pathology from this procedure ([id number]) showed a high-grade invasive ductal carcinoma which was estrogen-receptor positive at 98%, progesterone-receptor positive at 13% with an elevated MIB-1 at 69% and no evidence of HER-2 amplification, the ratio by CISH being 1.32.  With this information the patient was referred for bilateral breast MRIs and these were performed October 30, 2010. In the left breast there was a 1.8 cm ill-defined area in the central breast associated with post biopsy changes. There were really no other suspicious areas in either breast and no bony abnormalities and no abnormal appearing or enlarged lymph nodes.  Patient underwent left lumpectomy and sentinel lymph node sampling December 10, 2010 ([id number]) for a 1.1-cm invasive ductal carcinoma, grade 3, with 0/2 sentinel lymph nodes involved and so T1c N0 (stage I); the tumor being estrogen receptor 98% and progesterone receptor 13% positive, with no  HER-2 amplification (by initial determination, but amplified on repeat with a ratio of 2.30); with an Oncotype DX score of 43 predicting a 29% risk of recurrence if she takes 5 years of tamoxifen. (Her-2 was negative on the Oncotype DX determination, which uses a completely different method).  Decision was made to proceed with adjuvant chemotherapy, specifically 4 cycles of q. three-week docetaxel and cyclophosphamide given along with trastuzumab, to be followed by radiation and antiestrogen therapy. The trastuzumab will be continued for one year.  INTERVAL HISTORY: [name] returns today for followup of her left breast carcinoma, for which she continues to receive trastuzumab every 3 weeks. Her most recent dose was one week ago, 01/21/2012. She has been on tamoxifen since March of 2013, but recently discontinued due to cramps. She was also taking hydrochlorothiazide at the time. She discontinued both of these drugs at the same time, and her cramps have resolved. She was not taking a potassium supplement at that time.   REVIEW OF SYSTEMS: [name] is feeling well overall.  Her energy level is good. She continues to exercise on a regular basis, several times weekly. She has had no fevers, chills, or night sweats, and no significant hot flashes. No abnormal bleeding or evidence of abnormal clotting. She denies any unusual myalgias or arthralgias, but has had some increased pain in the surgical breast, and also beneath the breast in the anterior rib cage. This is tender to touch, and her to occasionally with movement.. She has some swelling in the feet and ankles, especially at the end of the day, but this resolves with elevation and is a chronic problem. No abnormal headaches or change in  vision. No nausea or change in bowel habits. No coughing, phlegm production, or increased shortness of breath.  Otherwise a detailed review of systems is noncontributory.  PAST MEDICAL HISTORY: Past Medical History  Diagnosis  Date  . Hypertension   . Breast lump     left  . Gum disease   . Cancer   . 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 Diabetes mellitus     borderline  . Sleep apnea     PAST SURGICAL HISTORY: Past Surgical History  Procedure Date  . Laparoscopic gastric banding   . Foot mass excision   . Soft tissue cyst excision     back cyst  . Vaginal mass excision   . Mass excision     back  . Portacath placement 02/11/11  . Resectoscopic polypectomy 2001    MYOMECTOMY  . Breast lumpectomy 2012    left    FAMILY HISTORY Family History  Problem Relation Age of Onset  . Cancer Maternal Uncle 70    prostate  . Alcohol abuse Father   . Cirrhosis Father   . Cancer Mother     breast   GYNECOLOGIC HISTORY: She had menarche at age 45, last menstrual period was late May 2012. Her periods however, are not regular and it had been almost a year before that. She has never used hormone replacement. She is GX P0.   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 She attends a [employer]. She goes to the gym at least three times a week.     ADVANCED DIRECTIVES:  HEALTH MAINTENANCE: History  Substance Use Topics  . Smoking status: Former Smoker -- 1.0 packs/day for 21 years    Types: Cigarettes    Quit date: 03/25/1999  . Smokeless tobacco: Never Used  . Alcohol Use: 0.6 oz/week    1 Glasses of wine per week     Colonoscopy:  PAP:  Bone density:  Lipid panel:  No Known Allergies  Current Outpatient Prescriptions  Medication Sig Dispense Refill  . amLODipine-olmesartan (AZOR) 10-40 MG per tablet Take 1 tablet by mouth daily.  30 tablet  6  . lidocaine-prilocaine (EMLA) cream Apply 1 application topically daily.       . multivitamin-iron-minerals-folic acid (CENTRUM) chewable tablet Chew 1 tablet by mouth daily.        . sodium chloride 0.9 % SOLN 250 mL with trastuzumab 440 MG SOLR 2 mg/kg Inject 2 mg/kg into the  vein. Every three weeks...      . b complex vitamins tablet Take 1 tablet by mouth daily.        . Calcium Citrate-Vitamin D (CALCET CREAMY BITES PO) Take by mouth daily.       . hydrochlorothiazide (HYDRODIURIL) 25 MG tablet Take 25 mg by mouth daily.      . tamoxifen (NOLVADEX) 20 MG tablet Take 20 mg by mouth daily.       [name] DISCONTD: prochlorperazine (COMPAZINE) 10 MG tablet       . DISCONTD: triamterene-hydrochlorothiazide (DYAZIDE) 37.5-25 MG per capsule Take 1 each (1 capsule total) by mouth every morning.  30 capsule  0   No current facility-administered medications for this visit.   Facility-Administered Medications Ordered in Other Visits  Medication Dose Route Frequency Provider Last Rate Last Dose  . 0.9 %  sodium chloride infusion   Intravenous Once Catalina Gravel, PA      .  sodium chloride 0.9 % injection 10 mL  10 mL Intracatheter PRN Catalina Gravel, PA   10 mL at 05/06/11 1721    OBJECTIVE: Middle-aged [place] American female who appears comfortable and in no acute distress. Filed Vitals:   01/28/12 1100  BP: 136/89  Pulse: 88  Temp: 99.1 F (37.3 C)  Resp: 20     Body mass index is 44.29 kg/(m^2).    ECOG FS: 0 Filed Weights   01/28/12 1100  Weight: 219 lb 4.8 oz (99.474 kg)   Physical Exam: HEENT:  Sclerae anicteric.  Oropharynx clear. Nodes:  No cervical, supraclavicular, or axillary lymphadenopathy palpated.  Breast Exam:  Right breast is benign, no masses, skin changes, or nipple inversion. Left breast is status post lumpectomy and radiation. There is mild hyperpigmentation, but no suspicious skin changes. No suspicious nodularity, and no evidence of local recurrence. There is some tenderness to palpation in the inferior portion of the breast, the inframammary fold, and also the anterior rib cage on the left.  Lungs:  Clear to auscultation bilaterally.  No crackles, rhonchi, or wheezes.   Heart:  Regular rate and rhythm.  No gallops or murmurs. Abdomen:  Soft, obese,  nontender.  Positive bowel sounds.   Musculoskeletal:  No focal spinal tenderness to palpation.  Extremities: No peripheral edema or cyanosis.   Neuro:  Nonfocal. Alert and oriented x3.   LAB RESULTS: Lab Results  Component Value Date   WBC 3.7* 01/21/2012   NEUTROABS 2.2 01/21/2012   HGB 11.5* 01/21/2012   HCT 35.0 01/21/2012   MCV 81.8 01/21/2012   PLT 222 01/21/2012      Chemistry      Component Value Date/Time   NA 139 01/21/2012 1004   NA 140 12/31/2011 1034   K 4.0 01/21/2012 1004   K 3.8 12/31/2011 1034   CL 106 01/21/2012 1004   CL 106 12/31/2011 1034   CO2 23 01/21/2012 1004   CO2 26 12/31/2011 1034   BUN 15.0 01/21/2012 1004   BUN 11 12/31/2011 1034   CREATININE 0.8 01/21/2012 1004   CREATININE 0.74 12/31/2011 1034      Component Value Date/Time   CALCIUM 9.0 01/21/2012 1004   CALCIUM 8.9 12/31/2011 1034   ALKPHOS 44 01/21/2012 1004   ALKPHOS 37* 12/31/2011 1034   AST 16 01/21/2012 1004   AST 15 12/31/2011 1034   ALT 12 01/21/2012 1004   ALT 11 12/31/2011 1034   BILITOT 0.40 01/21/2012 1004   BILITOT 0.3 12/31/2011 1034       Lab Results  Component Value Date   LABCA2 27 11/04/2010    STUDIES: Most recent mammogram at [facility] on 11/10/2011 was unremarkable.   ASSESSMENT: 56 year old [place] woman  1. Status post lumpectomy and sentinel lymph node sampling iAugust 2012 for a T1cN0, stage I invasive ductal carcinoma, grade 3, estrogen receptor 98% and progesterone receptor 13% positive, HER-2/neu amplified with a ratio of 2.3. Oncotype showed a recurrence score of 43 ( "high risk"), predicting a 29% risk of recurrence if the patient's only adjuvant treatment was tamoxifen  2. status post adjuvant docetaxel/ cyclophosphamide, completed December 2012  3. trastuzumab started 04/15/2011, to be continued to total one year.  4. radiation therapy, completed July 23, 2011 5. began on tamoxifen, 20 mg daily, in March 2013.  PLAN: [name] will continue to receive trastuzumab every  3 weeks through the end of December. She is due for her echocardiogram and a followup with Dr.  Gala Romney, and  that will be scheduled as soon as possible, hopefully before her next dose of trastuzumab and early October. She'll return to see Dr. Darnelle Catalan in early January, after completion of trastuzumab.  At that time, they will also discuss removal of [name] port.   Kinzleigh agrees to try tamoxifen once again, but will stay off of her diuretic. I suggested she try taking the tamoxifen every other day for one week. If she does not developed cramps, she can increase to a daily dose on week 2. I asked that she discontinue the drug and call us immediately with any recurrence of the cramping. In the meanwhile, she'll be sure she is getting plenty of potassium in her diet.  I have referred Amamda for a chest x-ray today as well to value weight the pain in the left anterior rib cage. She voices her understanding and agreement with this plan and will call with any changes or problems.   Jacquette Canales    01/28/2012

## 2012-01-28 NOTE — Telephone Encounter (Signed)
CXR today; Echo and [facility]im[facility], next available; lab and Herceptin 10/4, 10/25, 11/15, 12/6, and 12/27; lab and GM 05/18/12  Patient is aware of all appointments  Sent email to [facility] to set up patient's treatment

## 2012-01-28 NOTE — Patient Instructions (Signed)
Chest xray today to evaluate rib pain on left.  Continue Herceptin , every 3 weeks, through December.  Follow up with Dr. Darnelle Catalan in Jan after completing Herceptin.  Due for Echo and Dr. Gala Romney - if possible before Herceptin on 02/11/12.  Re-try tamoxifen.  Start at 20 mg every other day for 1 week. If good tolerance and no cramps, increase to 20 mg daily on week 2.  Discontinue and call if cramps return.

## 2012-01-28 NOTE — Telephone Encounter (Signed)
Per staff messsage and POF I have scheduled appts. JMW

## 2012-01-29 ENCOUNTER — Inpatient Hospital Stay (HOSPITAL_COMMUNITY): Admission: RE | Admit: 2012-01-29 | Payer: BC Managed Care – PPO | Source: Ambulatory Visit

## 2012-01-29 ENCOUNTER — Ambulatory Visit (HOSPITAL_COMMUNITY)
Admission: RE | Admit: 2012-01-29 | Discharge: 2012-01-29 | Disposition: A | Discharge status: Home / Self Care | Payer: BC Managed Care – PPO | Source: Ambulatory Visit | Attending: Physician Assistant | Admitting: Physician Assistant

## 2012-01-29 DIAGNOSIS — R079 Chest pain, unspecified: Secondary | ICD-10-CM | POA: Insufficient documentation

## 2012-01-29 DIAGNOSIS — C50919 Malignant neoplasm of unspecified site of unspecified female breast: Secondary | ICD-10-CM

## 2012-01-29 DIAGNOSIS — Z853 Personal history of malignant neoplasm of breast: Secondary | ICD-10-CM | POA: Insufficient documentation

## 2012-01-31 ENCOUNTER — Telehealth (INDEPENDENT_AMBULATORY_CARE_PROVIDER_SITE_OTHER): Payer: Self-pay | Admitting: General Surgery

## 2012-01-31 NOTE — Telephone Encounter (Signed)
I left a voicemail for the patient and requested patient contact [facility] at [phone] to schedule a bariatric surgery follow-up appointment...cef °

## 2012-02-11 ENCOUNTER — Ambulatory Visit (HOSPITAL_BASED_OUTPATIENT_CLINIC_OR_DEPARTMENT_OTHER): Payer: BC Managed Care – PPO

## 2012-02-11 ENCOUNTER — Other Ambulatory Visit (HOSPITAL_BASED_OUTPATIENT_CLINIC_OR_DEPARTMENT_OTHER): Payer: BC Managed Care – PPO | Admitting: Lab

## 2012-02-11 VITALS — BP 140/79 | HR 75 | Temp 98.5°F

## 2012-02-11 DIAGNOSIS — Z853 Personal history of malignant neoplasm of breast: Secondary | ICD-10-CM

## 2012-02-11 DIAGNOSIS — C50919 Malignant neoplasm of unspecified site of unspecified female breast: Secondary | ICD-10-CM

## 2012-02-11 DIAGNOSIS — Z5112 Encounter for antineoplastic immunotherapy: Secondary | ICD-10-CM

## 2012-02-11 LAB — CBC WITH DIFFERENTIAL/PLATELET
BASO%: 0.3 % (ref 0.0–2.0)
HCT: 36.4 % (ref 34.8–46.6)
LYMPH%: 33.7 % (ref 14.0–49.7)
MCH: 27.1 pg (ref 25.1–34.0)
MCHC: 33 g/dL (ref 31.5–36.0)
MCV: 82.4 fL (ref 79.5–101.0)
MONO#: 0.3 10*3/uL (ref 0.1–0.9)
MONO%: 8.7 % (ref 0.0–14.0)
NEUT%: 56.7 % (ref 38.4–76.8)
Platelets: 245 10*3/uL (ref 145–400)
RBC: 4.42 10*6/uL (ref 3.70–5.45)
nRBC: 0 % (ref 0–0)

## 2012-02-11 MED ORDER — TRASTUZUMAB CHEMO INJECTION 440 MG
6.0000 mg/kg | Freq: Once | INTRAVENOUS | Status: AC
  Administered 2012-02-11: 588 mg via INTRAVENOUS
  Filled 2012-02-11: qty 28

## 2012-02-11 MED ORDER — DIPHENHYDRAMINE HCL 25 MG PO CAPS
25.0000 mg | ORAL_CAPSULE | Freq: Once | ORAL | Status: AC
  Administered 2012-02-11: 25 mg via ORAL

## 2012-02-11 MED ORDER — SODIUM CHLORIDE 0.9 % IV SOLN: Freq: Once | INTRAVENOUS | Status: DC

## 2012-02-11 MED ORDER — SODIUM CHLORIDE 0.9 % IJ SOLN
10.0000 mL | INTRAMUSCULAR | Status: DC | PRN
  Administered 2012-02-11: 10 mL
  Filled 2012-02-11: qty 10

## 2012-02-11 MED ORDER — HEPARIN SOD (PORK) LOCK FLUSH 100 UNIT/ML IV SOLN
500.0000 [IU] | Freq: Once | INTRAVENOUS | Status: AC | PRN
  Administered 2012-02-11: 500 [IU]
  Filled 2012-02-11: qty 5

## 2012-02-11 MED ORDER — ACETAMINOPHEN 325 MG PO TABS
650.0000 mg | ORAL_TABLET | Freq: Once | ORAL | Status: AC
  Administered 2012-02-11: 650 mg via ORAL

## 2012-02-11 NOTE — Patient Instructions (Signed)
[name] [birth year] [id number]  [facility] Discharge Instructions for Patients Receiving Chemotherapy  Today you received the following chemotherapy agents: Herceptin   To help prevent nausea and vomiting after your treatment, we encourage you to take your nausea medication as prescribed; if you develop nausea and vomiting that is not controlled by your nausea medication, call the clinic.   BELOW ARE SYMPTOMS THAT SHOULD BE REPORTED IMMEDIATELY:  *FEVER GREATER THAN 100.5 F *CHILLS WITH OR WITHOUT FEVER *UNUSUAL SHORTNESS OF BREATH *UNUSUAL BRUISING OR BLEEDING *URINARY PROBLEMS *BOWEL PROBLEMS  I have been informed and understand all the instructions given to me.   Please call the [facility] at [phone] during business hours should you have any further questions or need assistance in obtaining follow-up care. If you have a medical emergency, please dial 911.

## 2012-02-18 ENCOUNTER — Ambulatory Visit (HOSPITAL_COMMUNITY)
Admission: RE | Admit: 2012-02-18 | Discharge: 2012-02-18 | Disposition: A | Discharge status: Home / Self Care | Payer: BC Managed Care – PPO | Source: Ambulatory Visit | Attending: Internal Medicine | Admitting: Internal Medicine

## 2012-02-18 VITALS — BP 108/72 | HR 67 | Wt 219.5 lb

## 2012-02-18 DIAGNOSIS — Z09 Encounter for follow-up examination after completed treatment for conditions other than malignant neoplasm: Secondary | ICD-10-CM

## 2012-02-18 DIAGNOSIS — I10 Essential (primary) hypertension: Secondary | ICD-10-CM

## 2012-02-18 DIAGNOSIS — C50919 Malignant neoplasm of unspecified site of unspecified female breast: Secondary | ICD-10-CM | POA: Insufficient documentation

## 2012-02-18 NOTE — Assessment & Plan Note (Addendum)
Controlled.  Continue current therapy.  Attending: Agree.  

## 2012-02-18 NOTE — Progress Notes (Signed)
  Echocardiogram 2D Echocardiogram LIMITED has been performed.  [name] 02/18/2012, 8:57 AM

## 2012-02-18 NOTE — Progress Notes (Signed)
Referring Physician: Dr. Darnelle Catalan  Primary Care: Dr. Clelia Croft   HPI:  [name] is a 56 y.o. 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  She is status post left lumpectomy on 12/10/10.  She was started on adjuvant chemotherapy with (started 02/25/11)  4 cycles of q3 week docetaxel and cyclophosphamide along with trastuzumab.  Then to continue with trastuzumab for 1 year.  She has been referred to the cardio-onc clinic for follow up while on herceptin therapy.   Echo's: 02/18/11: EF 65-70, lateral s' velocity 10.0 (Hyperdynamic) 06/16/11: EF 55-60%, lateral s' velocity 9.8 (re-read as 65%) 09/22/11: EF 55-60% lateral s' velocity 9.7 02/18/12: EF 55-60% lat s' 9.7  She returns for routine follow up today.  She is doing well.  She has her last therapy in December.  She denies SOB/orhtopnea/PND.  No edema.  Completed the Breast Cancer walk on Saturday without difficulty.     Review of Systems: All pertinent positives and negatives as in HPI, otherwise negative.    Past Medical History  Diagnosis Date  . Hypertension   . Breast lump     left  . Gum disease   . Cancer   . 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 Diabetes mellitus     borderline  . Sleep apnea     Current Outpatient Prescriptions  Medication Sig Dispense Refill  . amLODipine-olmesartan (AZOR) 10-40 MG per tablet Take 1 tablet by mouth daily.  30 tablet  6  . b complex vitamins tablet Take 1 tablet by mouth daily.        . Calcium Citrate-Vitamin D (CALCET CREAMY BITES PO) Take by mouth daily.       [name] lidocaine-prilocaine (EMLA) cream Apply 1 application topically daily.       . multivitamin-iron-minerals-folic acid (CENTRUM) chewable tablet Chew 1 tablet by mouth daily.        . sodium chloride 0.9 % SOLN 250 mL with trastuzumab 440 MG SOLR 2 mg/kg Inject 2 mg/kg into the vein. Every three weeks...      . tamoxifen (NOLVADEX) 20 MG tablet Take 20 mg by mouth every other day.         . hydrochlorothiazide (HYDRODIURIL) 25 MG tablet Take 25 mg by mouth daily.      [name] DISCONTD: prochlorperazine (COMPAZINE) 10 MG tablet       . DISCONTD: triamterene-hydrochlorothiazide (DYAZIDE) 37.5-25 MG per capsule Take 1 each (1 capsule total) by mouth every morning.  30 capsule  0   No current facility-administered medications for this encounter.   Facility-Administered Medications Ordered in Other Encounters  Medication Dose Route Frequency Provider Last Rate Last Dose  . 0.9 %  sodium chloride infusion   Intravenous Once Amy Allegra Grana, PA      . sodium chloride 0.9 % injection 10 mL  10 mL Intracatheter PRN Catalina Gravel, PA   10 mL at 05/06/11 1721    No Known Allergies  PHYSICAL EXAM: Filed Vitals:   02/18/12 0904  BP: 108/72  Pulse: 67  Weight: 219 lb 8 oz (99.565 kg)  SpO2: 100%   General:  Well appearing. No respiratory difficulty HEENT: normal Neck: supple. no JVD. Carotids 2+ bilat; no bruits. No lymphadenopathy or thryomegaly appreciated. Cor: PMI nondisplaced. Regular rate & rhythm. No rubs, gallops or murmurs. Lungs: clear Abdomen: obese, soft, nontender, nondistended. No hepatosplenomegaly. No bruits or masses. Good bowel sounds. Extremities: no  cyanosis, clubbing, rash, trace edema Neuro: alert & oriented x 3, cranial nerves grossly intact. moves all 4 extremities w/o difficulty. Affect pleasant.   ASSESSMENT & PLAN:

## 2012-02-18 NOTE — Assessment & Plan Note (Addendum)
Have reviewed today's echo with the patient.  EF and lateral s' remain stable.  She is ok to continue herceptin therapy.  She will finish therapy in December and will follow up in January for final echo.    Patient seen and examined with Ulyess Blossom, PA-C. We discussed all aspects of the encounter. I agree with the assessment and plan as stated above. I reviewed echos personally. EF and Doppler parameters stable. No HF on exam. Continue Herceptin.

## 2012-02-21 ENCOUNTER — Other Ambulatory Visit (HOSPITAL_COMMUNITY): Payer: BC Managed Care – PPO

## 2012-02-25 ENCOUNTER — Other Ambulatory Visit (HOSPITAL_COMMUNITY): Payer: BC Managed Care – PPO

## 2012-03-01 ENCOUNTER — Other Ambulatory Visit (HOSPITAL_COMMUNITY): Payer: Self-pay | Admitting: Physician Assistant

## 2012-03-02 ENCOUNTER — Other Ambulatory Visit: Payer: Self-pay | Admitting: *Deleted

## 2012-03-02 DIAGNOSIS — C50919 Malignant neoplasm of unspecified site of unspecified female breast: Secondary | ICD-10-CM

## 2012-03-03 ENCOUNTER — Ambulatory Visit (HOSPITAL_BASED_OUTPATIENT_CLINIC_OR_DEPARTMENT_OTHER): Payer: BC Managed Care – PPO

## 2012-03-03 ENCOUNTER — Other Ambulatory Visit (HOSPITAL_COMMUNITY): Payer: Self-pay | Admitting: *Deleted

## 2012-03-03 ENCOUNTER — Other Ambulatory Visit (HOSPITAL_BASED_OUTPATIENT_CLINIC_OR_DEPARTMENT_OTHER): Payer: BC Managed Care – PPO | Admitting: Lab

## 2012-03-03 VITALS — BP 155/89 | HR 64 | Temp 99.0°F

## 2012-03-03 DIAGNOSIS — C50919 Malignant neoplasm of unspecified site of unspecified female breast: Secondary | ICD-10-CM

## 2012-03-03 DIAGNOSIS — Z853 Personal history of malignant neoplasm of breast: Secondary | ICD-10-CM

## 2012-03-03 DIAGNOSIS — Z5112 Encounter for antineoplastic immunotherapy: Secondary | ICD-10-CM

## 2012-03-03 LAB — COMPREHENSIVE METABOLIC PANEL (CC13)
ALT: 13 U/L (ref 0–55)
Alkaline Phosphatase: 59 U/L (ref 40–150)
Creatinine: 0.7 mg/dL (ref 0.6–1.1)
Glucose: 88 mg/dl (ref 70–99)
Sodium: 142 mEq/L (ref 136–145)
Total Bilirubin: 0.5 mg/dL (ref 0.20–1.20)
Total Protein: 6.6 g/dL (ref 6.4–8.3)

## 2012-03-03 LAB — CBC WITH DIFFERENTIAL/PLATELET
EOS%: 0.5 % (ref 0.0–7.0)
MCH: 26.8 pg (ref 25.1–34.0)
MCV: 81.4 fL (ref 79.5–101.0)
MONO%: 6.1 % (ref 0.0–14.0)
RBC: 4.4 10*6/uL (ref 3.70–5.45)
RDW: 13.7 % (ref 11.2–14.5)
nRBC: 0 % (ref 0–0)

## 2012-03-03 MED ORDER — HEPARIN SOD (PORK) LOCK FLUSH 100 UNIT/ML IV SOLN
500.0000 [IU] | Freq: Once | INTRAVENOUS | Status: AC | PRN
  Administered 2012-03-03: 500 [IU]
  Filled 2012-03-03: qty 5

## 2012-03-03 MED ORDER — DIPHENHYDRAMINE HCL 25 MG PO CAPS
25.0000 mg | ORAL_CAPSULE | Freq: Once | ORAL | Status: AC
  Administered 2012-03-03: 25 mg via ORAL

## 2012-03-03 MED ORDER — SODIUM CHLORIDE 0.9 % IJ SOLN
10.0000 mL | INTRAMUSCULAR | Status: DC | PRN
  Administered 2012-03-03: 10 mL
  Filled 2012-03-03: qty 10

## 2012-03-03 MED ORDER — ACETAMINOPHEN 325 MG PO TABS
650.0000 mg | ORAL_TABLET | Freq: Once | ORAL | Status: AC
Start: 2012-03-03 — End: 2012-03-03
  Administered 2012-03-03: 650 mg via ORAL

## 2012-03-03 MED ORDER — TRASTUZUMAB CHEMO INJECTION 440 MG
6.0000 mg/kg | Freq: Once | INTRAVENOUS | Status: AC
  Administered 2012-03-03: 588 mg via INTRAVENOUS
  Filled 2012-03-03: qty 28

## 2012-03-03 MED ORDER — AMLODIPINE-OLMESARTAN 10-40 MG PO TABS: 1.0000 | ORAL_TABLET | Freq: Every day | ORAL | Status: DC

## 2012-03-03 MED ORDER — SODIUM CHLORIDE 0.9 % IV SOLN
Freq: Once | INTRAVENOUS | Status: AC
  Administered 2012-03-03: 11:00:00 via INTRAVENOUS

## 2012-03-03 NOTE — Progress Notes (Signed)
Patient states she has not had her blood pressure medication in 2 days.

## 2012-03-03 NOTE — Patient Instructions (Signed)
[facility] Discharge Instructions for Patients Receiving Chemotherapy  Today you received the following chemotherapy agents Herceptin To help prevent nausea and vomiting after your treatment, we encourage you to take your nausea medication as prescribed.  If you develop nausea and vomiting that is not controlled by your nausea medication, call the clinic. If it is after clinic hours your family physician or the after hours number for the clinic or go to the Emergency Department.   BELOW ARE SYMPTOMS THAT SHOULD BE REPORTED IMMEDIATELY:  *FEVER GREATER THAN 100.5 F  *CHILLS WITH OR WITHOUT FEVER  NAUSEA AND VOMITING THAT IS NOT CONTROLLED WITH YOUR NAUSEA MEDICATION  *UNUSUAL SHORTNESS OF BREATH  *UNUSUAL BRUISING OR BLEEDING  TENDERNESS IN MOUTH AND THROAT WITH OR WITHOUT PRESENCE OF ULCERS  *URINARY PROBLEMS  *BOWEL PROBLEMS  UNUSUAL RASH Items with * indicate a potential emergency and should be followed up as soon as possible.  One of the nurses will contact you 24 hours after your treatment. Please let the nurse know about any problems that you may have experienced. Feel free to call the clinic you have any questions or concerns. The clinic phone number is [phone].   I have been informed and understand all the instructions given to me. I know to contact the clinic, my physician, or go to the Emergency Department if any problems should occur. I do not have any questions at this time, but understand that I may call the clinic during office hours   should I have any questions or need assistance in obtaining follow up care.    __________________________________________  _____________  __________ Signature of Patient or Authorized Representative            Date                   Time    __________________________________________ Nurse's Signature    

## 2012-03-23 ENCOUNTER — Other Ambulatory Visit: Payer: Self-pay | Admitting: *Deleted

## 2012-03-24 ENCOUNTER — Other Ambulatory Visit (HOSPITAL_BASED_OUTPATIENT_CLINIC_OR_DEPARTMENT_OTHER): Payer: BC Managed Care – PPO | Admitting: Lab

## 2012-03-24 ENCOUNTER — Other Ambulatory Visit: Payer: Self-pay | Admitting: *Deleted

## 2012-03-24 ENCOUNTER — Ambulatory Visit (HOSPITAL_BASED_OUTPATIENT_CLINIC_OR_DEPARTMENT_OTHER): Payer: BC Managed Care – PPO

## 2012-03-24 VITALS — BP 139/80 | HR 75 | Temp 98.2°F

## 2012-03-24 DIAGNOSIS — Z5112 Encounter for antineoplastic immunotherapy: Secondary | ICD-10-CM

## 2012-03-24 DIAGNOSIS — C50919 Malignant neoplasm of unspecified site of unspecified female breast: Secondary | ICD-10-CM

## 2012-03-24 DIAGNOSIS — Z853 Personal history of malignant neoplasm of breast: Secondary | ICD-10-CM

## 2012-03-24 LAB — CBC WITH DIFFERENTIAL/PLATELET
BASO%: 0.2 % (ref 0.0–2.0)
EOS%: 1.2 % (ref 0.0–7.0)
HCT: 38.3 % (ref 34.8–46.6)
LYMPH%: 32.4 % (ref 14.0–49.7)
MCH: 26.8 pg (ref 25.1–34.0)
MCHC: 32.6 g/dL (ref 31.5–36.0)
NEUT%: 58.4 % (ref 38.4–76.8)
Platelets: 271 10*3/uL (ref 145–400)
lymph#: 1.3 10*3/uL (ref 0.9–3.3)

## 2012-03-24 MED ORDER — ACETAMINOPHEN 325 MG PO TABS
650.0000 mg | ORAL_TABLET | Freq: Once | ORAL | Status: AC
  Administered 2012-03-24: 650 mg via ORAL

## 2012-03-24 MED ORDER — SODIUM CHLORIDE 0.9 % IJ SOLN
10.0000 mL | INTRAMUSCULAR | Status: DC | PRN
  Administered 2012-03-24: 10 mL
  Filled 2012-03-24: qty 10

## 2012-03-24 MED ORDER — TRASTUZUMAB CHEMO INJECTION 440 MG
6.0000 mg/kg | Freq: Once | INTRAVENOUS | Status: AC
  Administered 2012-03-24: 588 mg via INTRAVENOUS
  Filled 2012-03-24: qty 28

## 2012-03-24 MED ORDER — HEPARIN SOD (PORK) LOCK FLUSH 100 UNIT/ML IV SOLN
500.0000 [IU] | Freq: Once | INTRAVENOUS | Status: AC | PRN
  Administered 2012-03-24: 500 [IU]
  Filled 2012-03-24: qty 5

## 2012-03-24 MED ORDER — DIPHENHYDRAMINE HCL 25 MG PO CAPS
25.0000 mg | ORAL_CAPSULE | Freq: Once | ORAL | Status: AC
  Administered 2012-03-24: 25 mg via ORAL

## 2012-03-24 MED ORDER — SODIUM CHLORIDE 0.9 % IV SOLN
Freq: Once | INTRAVENOUS | Status: AC
  Administered 2012-03-24: 11:00:00 via INTRAVENOUS

## 2012-03-24 NOTE — Progress Notes (Signed)
Pt in for treatment and wants to know could she see Dr. Magnus Ivan, orthopedic MD who she has seen before, for the muscle cramps in her L ankle/leg/foot that she has discussed with Zollie Scale, PA before.  Per Amy, pt can call their office to make an appt since she has been a pt before, and if she has any problems, call this office back, and referral can be made if needed.  Informed pt of this, and she verbalizes understanding.

## 2012-03-24 NOTE — Patient Instructions (Addendum)
[facility] Discharge Instructions for Patients Receiving Chemotherapy  Today you received the following chemotherapy agents Herceptin.      BELOW ARE SYMPTOMS THAT SHOULD BE REPORTED IMMEDIATELY:  *FEVER GREATER THAN 100.5 F  *CHILLS WITH OR WITHOUT FEVER  NAUSEA AND VOMITING THAT IS NOT CONTROLLED WITH YOUR NAUSEA MEDICATION  *UNUSUAL SHORTNESS OF BREATH  *UNUSUAL BRUISING OR BLEEDING  TENDERNESS IN MOUTH AND THROAT WITH OR WITHOUT PRESENCE OF ULCERS  *URINARY PROBLEMS  *BOWEL PROBLEMS  UNUSUAL RASH Items with * indicate a potential emergency and should be followed up as soon as possible.  Feel free to call the clinic you have any questions or concerns. The clinic phone number is [phone].    

## 2012-04-14 ENCOUNTER — Ambulatory Visit (HOSPITAL_BASED_OUTPATIENT_CLINIC_OR_DEPARTMENT_OTHER): Payer: BC Managed Care – PPO

## 2012-04-14 VITALS — BP 139/76 | HR 88 | Temp 98.7°F

## 2012-04-14 DIAGNOSIS — Z853 Personal history of malignant neoplasm of breast: Secondary | ICD-10-CM

## 2012-04-14 DIAGNOSIS — Z5112 Encounter for antineoplastic immunotherapy: Secondary | ICD-10-CM

## 2012-04-14 DIAGNOSIS — C50919 Malignant neoplasm of unspecified site of unspecified female breast: Secondary | ICD-10-CM

## 2012-04-14 MED ORDER — SODIUM CHLORIDE 0.9 % IJ SOLN
10.0000 mL | INTRAMUSCULAR | Status: DC | PRN
  Administered 2012-04-14: 10 mL
  Filled 2012-04-14: qty 10

## 2012-04-14 MED ORDER — ACETAMINOPHEN 325 MG PO TABS
650.0000 mg | ORAL_TABLET | Freq: Once | ORAL | Status: AC
  Administered 2012-04-14: 650 mg via ORAL

## 2012-04-14 MED ORDER — SODIUM CHLORIDE 0.9 % IV SOLN
Freq: Once | INTRAVENOUS | Status: AC
  Administered 2012-04-14: 10:00:00 via INTRAVENOUS

## 2012-04-14 MED ORDER — TRASTUZUMAB CHEMO INJECTION 440 MG
6.0000 mg/kg | Freq: Once | INTRAVENOUS | Status: AC
  Administered 2012-04-14: 588 mg via INTRAVENOUS
  Filled 2012-04-14: qty 28

## 2012-04-14 MED ORDER — DIPHENHYDRAMINE HCL 25 MG PO CAPS
25.0000 mg | ORAL_CAPSULE | Freq: Once | ORAL | Status: AC
  Administered 2012-04-14: 25 mg via ORAL

## 2012-04-14 MED ORDER — HEPARIN SOD (PORK) LOCK FLUSH 100 UNIT/ML IV SOLN
500.0000 [IU] | Freq: Once | INTRAVENOUS | Status: AC | PRN
  Administered 2012-04-14: 500 [IU]
  Filled 2012-04-14: qty 5

## 2012-04-14 NOTE — Patient Instructions (Addendum)
[facility] Discharge Instructions for Patients Receiving Chemotherapy  Today you received the following chemotherapy agents Herceptin  To help prevent nausea and vomiting after your treatment, we encourage you to take your nausea medication as prescribed Begin taking it as directed and take it as often as prescribed or as needed.   If you develop nausea and vomiting that is not controlled by your nausea medication, call the clinic. If it is after clinic hours your family physician or the after hours number for the clinic or go to the Emergency Department.   BELOW ARE SYMPTOMS THAT SHOULD BE REPORTED IMMEDIATELY:  *FEVER GREATER THAN 100.5 F  *CHILLS WITH OR WITHOUT FEVER  NAUSEA AND VOMITING THAT IS NOT CONTROLLED WITH YOUR NAUSEA MEDICATION  *UNUSUAL SHORTNESS OF BREATH  *UNUSUAL BRUISING OR BLEEDING  TENDERNESS IN MOUTH AND THROAT WITH OR WITHOUT PRESENCE OF ULCERS  *URINARY PROBLEMS  *BOWEL PROBLEMS  UNUSUAL RASH Items with * indicate a potential emergency and should be followed up as soon as possible.  One of the nurses will contact you 24 hours after your treatment. Please let the nurse know about any problems that you may have experienced. Feel free to call the clinic you have any questions or concerns. The clinic phone number is [phone].   I have been informed and understand all the instructions given to me. I know to contact the clinic, my physician, or go to the Emergency Department if any problems should occur. I do not have any questions at this time, but understand that I may call the clinic during office hours   should I have any questions or need assistance in obtaining follow up care.    __________________________________________  _____________  __________ Signature of Patient or Authorized Representative            Date                   Time    __________________________________________ Nurse's Signature

## 2012-04-18 ENCOUNTER — Other Ambulatory Visit: Payer: Self-pay | Admitting: Certified Registered Nurse Anesthetist

## 2012-05-05 ENCOUNTER — Other Ambulatory Visit (HOSPITAL_BASED_OUTPATIENT_CLINIC_OR_DEPARTMENT_OTHER): Payer: BC Managed Care – PPO | Admitting: Lab

## 2012-05-05 ENCOUNTER — Ambulatory Visit (HOSPITAL_BASED_OUTPATIENT_CLINIC_OR_DEPARTMENT_OTHER): Payer: BC Managed Care – PPO

## 2012-05-05 VITALS — BP 122/74 | HR 74 | Temp 97.9°F

## 2012-05-05 DIAGNOSIS — Z853 Personal history of malignant neoplasm of breast: Secondary | ICD-10-CM

## 2012-05-05 DIAGNOSIS — Z5112 Encounter for antineoplastic immunotherapy: Secondary | ICD-10-CM

## 2012-05-05 DIAGNOSIS — C50919 Malignant neoplasm of unspecified site of unspecified female breast: Secondary | ICD-10-CM

## 2012-05-05 LAB — CBC WITH DIFFERENTIAL/PLATELET
BASO%: 0.5 % (ref 0.0–2.0)
Basophils Absolute: 0 10*3/uL (ref 0.0–0.1)
EOS%: 1.4 % (ref 0.0–7.0)
Eosinophils Absolute: 0.1 10*3/uL (ref 0.0–0.5)
HCT: 36.6 % (ref 34.8–46.6)
HGB: 12 g/dL (ref 11.6–15.9)
LYMPH%: 32.5 % (ref 14.0–49.7)
MCH: 26.5 pg (ref 25.1–34.0)
MCHC: 32.8 g/dL (ref 31.5–36.0)
MCV: 81 fL (ref 79.5–101.0)
MONO#: 0.3 10*3/uL (ref 0.1–0.9)
MONO%: 6.1 % (ref 0.0–14.0)
NEUT#: 2.6 10*3/uL (ref 1.5–6.5)
NEUT%: 59.5 % (ref 38.4–76.8)
Platelets: 237 10*3/uL (ref 145–400)
RBC: 4.52 10*6/uL (ref 3.70–5.45)
RDW: 14.3 % (ref 11.2–14.5)
WBC: 4.4 10*3/uL (ref 3.9–10.3)
lymph#: 1.4 10*3/uL (ref 0.9–3.3)
nRBC: 0 % (ref 0–0)

## 2012-05-05 MED ORDER — ACETAMINOPHEN 325 MG PO TABS
650.0000 mg | ORAL_TABLET | Freq: Once | ORAL | Status: AC
  Administered 2012-05-05: 650 mg via ORAL

## 2012-05-05 MED ORDER — HEPARIN SOD (PORK) LOCK FLUSH 100 UNIT/ML IV SOLN
500.0000 [IU] | Freq: Once | INTRAVENOUS | Status: AC | PRN
  Administered 2012-05-05: 500 [IU]
  Filled 2012-05-05: qty 5

## 2012-05-05 MED ORDER — SODIUM CHLORIDE 0.9 % IJ SOLN
10.0000 mL | INTRAMUSCULAR | Status: DC | PRN
  Administered 2012-05-05: 10 mL
  Filled 2012-05-05: qty 10

## 2012-05-05 MED ORDER — TRASTUZUMAB CHEMO INJECTION 440 MG
6.0000 mg/kg | Freq: Once | INTRAVENOUS | Status: AC
  Administered 2012-05-05: 588 mg via INTRAVENOUS
  Filled 2012-05-05: qty 28

## 2012-05-05 MED ORDER — SODIUM CHLORIDE 0.9 % IV SOLN
Freq: Once | INTRAVENOUS | Status: AC
  Administered 2012-05-05: 10:00:00 via INTRAVENOUS

## 2012-05-05 MED ORDER — DIPHENHYDRAMINE HCL 25 MG PO CAPS
25.0000 mg | ORAL_CAPSULE | Freq: Once | ORAL | Status: AC
  Administered 2012-05-05: 25 mg via ORAL

## 2012-05-05 NOTE — Patient Instructions (Signed)
[facility] Discharge Instructions for Patients Receiving Chemotherapy  Today you received the following chemotherapy agents Herceptin To help prevent nausea and vomiting after your treatment, we encourage you to take your nausea medication as prescribed.  If you develop nausea and vomiting that is not controlled by your nausea medication, call the clinic. If it is after clinic hours your family physician or the after hours number for the clinic or go to the Emergency Department.   BELOW ARE SYMPTOMS THAT SHOULD BE REPORTED IMMEDIATELY:  *FEVER GREATER THAN 100.5 F  *CHILLS WITH OR WITHOUT FEVER  NAUSEA AND VOMITING THAT IS NOT CONTROLLED WITH YOUR NAUSEA MEDICATION  *UNUSUAL SHORTNESS OF BREATH  *UNUSUAL BRUISING OR BLEEDING  TENDERNESS IN MOUTH AND THROAT WITH OR WITHOUT PRESENCE OF ULCERS  *URINARY PROBLEMS  *BOWEL PROBLEMS  UNUSUAL RASH Items with * indicate a potential emergency and should be followed up as soon as possible.  One of the nurses will contact you 24 hours after your treatment. Please let the nurse know about any problems that you may have experienced. Feel free to call the clinic you have any questions or concerns. The clinic phone number is [phone].   I have been informed and understand all the instructions given to me. I know to contact the clinic, my physician, or go to the Emergency Department if any problems should occur. I do not have any questions at this time, but understand that I may call the clinic during office hours   should I have any questions or need assistance in obtaining follow up care.    __________________________________________  _____________  __________ Signature of Patient or Authorized Representative            Date                   Time    __________________________________________ Nurse's Signature     Lymphedema Lymphedema is a swelling caused by the abnormal collection of lymph under the skin. The  lymph is fluid from the tissues in your body that travels in the lymphatic system. This system is part of the immune system that includes lymph nodes and vessels. The lymph vessels collect and carry the excess fluid, fats, proteins, and wastes from the tissues of the body to the bloodstream. This system also works to clean and remove bacteria and waste products from the body.  Lymphedema occurs when the lymphatic system is blocked. When the lymph vessels or lymph nodes are blocked or damaged, lymph does not drain properly. This causes abnormal build up of lymph. This leads to swelling in the arms or legs. Lymphedema cannot be cured by medicines. But the swelling can be reduced by physical methods. CAUSES  There are two types of Lymphedema. Primary lymphedema is caused by the absence or abnormality of the lymph vessel at birth. It is also known as inherited lymphedema, which occurs rarely. Secondary or acquired lymphedema occurs when the lymph vessel is damaged or blocked. The causes of lymph vessel blockage are:   Skin infection like cellulites.  Infection by parasites (filariasis).  Injury.  Cancer.  Radiation therapy.  Formation of scar tissue.  Surgery. SYMPTOMS  The symptoms of lymphedema are:  Abnormal swelling of the arm or leg.  Heavy or tight feeling in your arm or leg.  Tight-fitting shoes or rings.  Redness of skin over the affected area.  Limited movement of the affected limb.  Some patients complain about sensitivity to touch and discomfort  in the limb(s) affected. You may not have these symptoms immediately following injury. They usually appear within a few days or even years after injury. Inform your caregiver, if you have any of these symptoms. Early treatment can avoid further problems.  DIAGNOSIS  First, your caregiver will inquire about any surgery you have had or medicines you are taking. He will then examine you. Your caregiver may order special imaging tests,  such as:  Lymphoscintigraphy (a test in which a low dose of radioactive substance is injected to trace the flow of lymph through the lymph vessels).  MRI (imaging tests using sound waves).  Computed tomography (test using special cross-sectional X-rays).  Duplex ultrasound (test using high-frequency sound waves to show the vessels and the blood flow on a screen).  Lymphangiography (special X-ray taken after injecting a contrast dye into the lymph vessel). It is now rarely done. TREATMENT  Lymphedema can be treated in different ways. Your caregiver will decide the type of treatment depending on the cause. Treatment may include:  Exercise: Special exercises will help fluid move out easily from the affected part. This should be done as per your caregiver's advice.  Manual lymph drainage: Gentle massage of the affected limb makes the fluid to move out more freely.  Compression: Compression stockings or external pump apply pressure over the affected limb. This helps the fluid to move out from the arm or leg. Bandaging can also help to move the fluid out from the affected part. Your caregiver will decide the method that suits you the most.   Medicines: Your caregiver may prescribe antibiotics, if you have infection.  Surgery: Your caregiver may advise surgery for severe lymphedema. It is reserved for special cases when the patient has difficulty moving. Your surgeon may remove excess tissue from the arm or leg. This will help to ease your movement. Physical therapy may have to be continued after surgery. HOME CARE INSTRUCTIONS   Eat a healthy diet.  Exercise regularly as per advice.  Keep the affected area clean and dry.  Use gloves while cooking or gardening.  Protect your skin from cuts.  Use electric razor to shave the affected area.  Keep affected limb elevated.  Do not wear tight clothes, shoes, or jewels as it may cause the tissue to be strangled.  Do not use heat pads over  the affected area.  Do not sit with cross legs.  Do not walk barefoot.  Do not carry weight on the affected arm.  Avoid having blood pressure checked on the affected limb.  The area is very fragile and is predisposed to injury and infection. SEEK MEDICAL CARE IF:  You continue to have swelling in your limb. SEEK IMMEDIATE MEDICAL CARE IF:   You have high fever.  You have skin rash.  You have chills or sweats.  You have pain or redness.  You have a cut that does not heal. MAKE SURE YOU:   Understand these instructions.  Will watch your condition.  Will get help right away if you are not doing well or get worse. Document Released: 02/21/2007 Document Revised: 07/19/2011 Document Reviewed: 01/27/2009 [facility] Patient Information 2013 [place], [place].

## 2012-05-17 ENCOUNTER — Telehealth: Payer: Self-pay | Admitting: Oncology

## 2012-05-17 NOTE — Telephone Encounter (Signed)
pt called in to r/s her appt ,done     anne

## 2012-05-18 ENCOUNTER — Other Ambulatory Visit: Payer: BC Managed Care – PPO | Admitting: Lab

## 2012-05-18 ENCOUNTER — Ambulatory Visit: Payer: BC Managed Care – PPO | Admitting: Oncology

## 2012-05-24 ENCOUNTER — Other Ambulatory Visit (HOSPITAL_BASED_OUTPATIENT_CLINIC_OR_DEPARTMENT_OTHER): Payer: BC Managed Care – PPO | Admitting: Lab

## 2012-05-24 ENCOUNTER — Other Ambulatory Visit: Payer: Self-pay | Admitting: *Deleted

## 2012-05-24 ENCOUNTER — Ambulatory Visit (HOSPITAL_BASED_OUTPATIENT_CLINIC_OR_DEPARTMENT_OTHER): Payer: BC Managed Care – PPO | Admitting: Oncology

## 2012-05-24 VITALS — BP 135/83 | HR 76 | Temp 98.4°F | Resp 20 | Ht 59.0 in | Wt 226.7 lb

## 2012-05-24 DIAGNOSIS — C50319 Malignant neoplasm of lower-inner quadrant of unspecified female breast: Secondary | ICD-10-CM

## 2012-05-24 DIAGNOSIS — C50919 Malignant neoplasm of unspecified site of unspecified female breast: Secondary | ICD-10-CM

## 2012-05-24 DIAGNOSIS — Z170 Estrogen receptor positive status [ER+]: Secondary | ICD-10-CM

## 2012-05-24 LAB — CBC WITH DIFFERENTIAL/PLATELET
Basophils Absolute: 0 10*3/uL (ref 0.0–0.1)
Eosinophils Absolute: 0 10*3/uL (ref 0.0–0.5)
HGB: 11.4 g/dL — ABNORMAL LOW (ref 11.6–15.9)
LYMPH%: 24.3 % (ref 14.0–49.7)
MCV: 80.9 fL (ref 79.5–101.0)
MONO%: 5.7 % (ref 0.0–14.0)
NEUT#: 4 10*3/uL (ref 1.5–6.5)
NEUT%: 69.3 % (ref 38.4–76.8)
Platelets: 247 10*3/uL (ref 145–400)

## 2012-05-24 NOTE — Progress Notes (Signed)
ID: [name]   DOB: April 18, 1956  MR#: [id number]  CSN#:[id number]  PCP: Tammy Baars, MD GYN: SUOvidio [name] OTHER MD:   HISTORY OF PRESENT ILLNESS: [name] is a 57 year old [place] woman referred by [name] for treatment of left breast cancer.  The patient underwent screening bilateral mammography July 04, 2009, at [facility] and this showed only a scattered fibroglandular densities. However, repeat screening exam Sep 29, 2010 showed a potential abnormality in the left breast, retroareolar region. The patient was recalled for additional views Oct 08, 2010 and [name] was able to locate the nodular density noted on the screening exam. It measured approximately 8 mm. Ultrasound showed a second nodular density just lateral to the nipple areolar complex. In that area, there was what appears to be either a small cluster of cysts or perhaps a solid nodule. In particular, the nodule seen in the 3 o'clock position was felt to be most likely a fibroadenoma. The other area, however, required biopsy and this was performed October 26, 2010. The pathology from this procedure ([id number]) showed a high-grade invasive ductal carcinoma which was estrogen-receptor positive at 98%, progesterone-receptor positive at 13% with an elevated MIB-1 at 69% and no evidence of HER-2 amplification, the ratio by CISH being 1.32.  With this information the patient was referred for bilateral breast MRIs and these were performed October 30, 2010. In the left breast there was a 1.8 cm ill-defined area in the central breast associated with post biopsy changes. There were really no other suspicious areas in either breast and no bony abnormalities and no abnormal appearing or enlarged lymph nodes.  Patient underwent left lumpectomy and sentinel lymph node sampling December 10, 2010 ([id number]) for a 1.1-cm invasive ductal carcinoma, grade 3, with 0/2 sentinel lymph nodes involved and so T1c N0 (stage I); the tumor being estrogen  receptor 98% and progesterone receptor 13% positive, with no HER-2 amplification (by initial determination, but amplified on repeat with a ratio of 2.30); with an Oncotype DX score of 43 predicting a 29% risk of recurrence if she takes 5 years of tamoxifen. (Her-2 was negative on the Oncotype DX determination, which uses a completely different method).  Her subsequent history is as detailed below.  INTERVAL HISTORY: [name] returns today for followup of her left breast carcinoma. She completed her year of trastuzumab at the end of December. She generally did quite well with this. She is working full-time, family is fine, and the interval history generally is unremarkable.   REVIEW OF SYSTEMS: She has some discomfort of in the left breast related to the prior surgery, but no unusual swelling, erythema, or any "bumps" or skin changes that she has noted. She feels a little tired but she still manages to exercise 1-3 times a week. She's gained a little weight, which she doesn't like, despite her old lap band. She was getting leg cramps and she stopped all her medicines including the tamoxifen. The cancer little better though they have not completely resolved. Otherwise a detailed review of systems today was noncontributory.  PAST MEDICAL HISTORY: Past Medical History  Diagnosis Date  . Hypertension   . Breast lump     left  . Gum disease   . Cancer   . 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 Diabetes mellitus     borderline  . Sleep apnea     PAST SURGICAL HISTORY: Past Surgical History  Procedure Date  .  Laparoscopic gastric banding   . Foot mass excision   . Soft tissue cyst excision     back cyst  . Vaginal mass excision   . Mass excision     back  . Portacath placement 02/11/11  . Resectoscopic polypectomy 2001    MYOMECTOMY  . Breast lumpectomy 2012    left    FAMILY HISTORY Family History  Problem Relation Age of Onset  . Cancer Maternal Uncle  70    prostate  . Alcohol abuse Father   . Cirrhosis Father   . Cancer Mother     breast   GYNECOLOGIC HISTORY: She had menarche at age 39, last menstrual period was [name] May 2012. Her periods however, are not regular and it had been almost a year before that. She has never used hormone replacement. She is GX P0.   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 She attends a [employer]. She goes to the gym at least three times a week.     ADVANCED DIRECTIVES:  HEALTH MAINTENANCE: History  Substance Use Topics  . Smoking status: Former Smoker -- 1.0 packs/day for 21 years    Types: Cigarettes    Quit date: 03/25/1999  . Smokeless tobacco: Never Used  . Alcohol Use: 0.6 oz/week    1 Glasses of wine per week     Colonoscopy:  PAP:  Bone density:  Lipid panel:  No Known Allergies  Current Outpatient Prescriptions  Medication Sig Dispense Refill  . amLODipine-olmesartan (AZOR) 10-40 MG per tablet Take 1 tablet by mouth daily.  30 tablet  6  . b complex vitamins tablet Take 1 tablet by mouth daily.        . Calcium Citrate-Vitamin D (CALCET CREAMY BITES PO) Take by mouth daily.       . hydrochlorothiazide (HYDRODIURIL) 25 MG tablet Take 25 mg by mouth daily.      [name] lidocaine-prilocaine (EMLA) cream Apply 1 application topically daily.       . multivitamin-iron-minerals-folic acid (CENTRUM) chewable tablet Chew 1 tablet by mouth daily.        . sodium chloride 0.9 % SOLN 250 mL with trastuzumab 440 MG SOLR 2 mg/kg Inject 2 mg/kg into the vein. Every three weeks...      . tamoxifen (NOLVADEX) 20 MG tablet Take 20 mg by mouth every other day.       . [DISCONTINUED] prochlorperazine (COMPAZINE) 10 MG tablet       . [DISCONTINUED] triamterene-hydrochlorothiazide (DYAZIDE) 37.5-25 MG per capsule Take 1 each (1 capsule total) by mouth every morning.  30 capsule  0   No current facility-administered medications for this visit.    Facility-Administered Medications Ordered in Other Visits  Medication Dose Route Frequency Provider Last Rate Last Dose  . 0.9 %  sodium chloride infusion   Intravenous Once Amy Allegra Grana, PA      . sodium chloride 0.9 % injection 10 mL  10 mL Intracatheter PRN Catalina Gravel, PA   10 mL at 05/06/11 1721    OBJECTIVE: Middle-aged [place] American female who looks well Filed Vitals:   05/24/12 1553  BP: 135/83  Pulse: 76  Temp: 98.4 F (36.9 C)  Resp: 20     Body mass index is 45.79 kg/(m^2).    ECOG FS: 0 Filed Weights   05/24/12 1553  Weight: 226 lb 11.2 oz (102.83 kg)   Physical Exam: HEENT:  Sclerae anicteric.  Oropharynx clear. Nodes:  No cervical or supraclavicular adenopathy  Breast Exam:  The right breast is unremarkable. The left breast is status post lumpectomy and radiation. It is smaller than the right breast and there is some distortion in the inferior portion where the scar is. There is no evidence of local recurrence in the left axilla is benign.  Lungs:  No rales or rhonchi, good excursion   Heart:  Regular rate and rhythm.  No murmur appreciated Abdomen:  Benign  Musculoskeletal:  No focal spinal tenderness to palpation.  Extremities: No peripheral edema or cyanosis.   Neuro:  Nonfocal. Well oriented. Positive affect.   LAB RESULTS: Lab Results  Component Value Date   WBC 5.8 05/24/2012   NEUTROABS 4.0 05/24/2012   HGB 11.4* 05/24/2012   HCT 34.7* 05/24/2012   MCV 80.9 05/24/2012   PLT 247 05/24/2012      Chemistry      Component Value Date/Time   NA 142 03/03/2012 0954   NA 140 12/31/2011 1034   K 3.6 03/03/2012 0954   K 3.8 12/31/2011 1034   CL 108* 03/03/2012 0954   CL 106 12/31/2011 1034   CO2 25 03/03/2012 0954   CO2 26 12/31/2011 1034   BUN 12.0 03/03/2012 0954   BUN 11 12/31/2011 1034   CREATININE 0.7 03/03/2012 0954   CREATININE 0.74 12/31/2011 1034      Component Value Date/Time   CALCIUM 9.2 03/03/2012 0954   CALCIUM 8.9 12/31/2011 1034    ALKPHOS 59 03/03/2012 0954   ALKPHOS 37* 12/31/2011 1034   AST 13 03/03/2012 0954   AST 15 12/31/2011 1034   ALT 13 03/03/2012 0954   ALT 11 12/31/2011 1034   BILITOT 0.50 03/03/2012 0954   BILITOT 0.3 12/31/2011 1034       Lab Results  Component Value Date   LABCA2 27 11/04/2010    STUDIES: Most recent mamm[facility] at [facility] on 11/10/2011 was unremarkable.   ASSESSMENT: 57 year old [place] woman   1. Status post left lumpectomy and sentinel lymph node sampling August 2012 for a T1cN0, stage IA invasive ductal carcinoma, grade 3, estrogen receptor 98% and progesterone receptor 13% positive, HER-2/neu amplified with a ratio of 2.3.   2. Oncotype showed a recurrence score of 43 ( "high risk"), predicting a 29% risk of recurrence if the patient's only adjuvant treatment was tamoxifen   3. received adjuvant docetaxel/ cyclophosphamide x4, completed December 2012   4. trastuzumab started 04/15/2011, completed 05/05/2012; most recent echo 02/18/2012  5. radiation therapy, completed July 23, 2011  6. began on tamoxifen March 2013.  PLAN: [name] wanted to know why she should be on tamoxifen. Since anti-estrogens generally cut the risk of recurrence in half, and she had a 30% risk of recurrence predicted by the Oncotype test even with tamoxifen, that means her baseline risk must have been about 60%. Chemotherapy generally reduces risk by one third which means she would have a 40% risk of recurrence if she did not take tamoxifen, dropping to about 20% with tamoxifen.  This is very motivating, and she agrees to continue the tamoxifen. She would like to have her port removed and I have sent a note to Dr. Ezzard Standing regarding that. Otherwise she will see [place] again in 3 months. She knows to call for any problems that may develop before the next visit.  MAGRINAT,GUSTAV C    05/24/2012

## 2012-05-29 ENCOUNTER — Telehealth (INDEPENDENT_AMBULATORY_CARE_PROVIDER_SITE_OTHER): Payer: Self-pay

## 2012-05-29 NOTE — Telephone Encounter (Signed)
Pt aware of appt 06/14/12 10:30 am

## 2012-06-14 ENCOUNTER — Encounter (INDEPENDENT_AMBULATORY_CARE_PROVIDER_SITE_OTHER): Payer: Self-pay | Admitting: Surgery

## 2012-06-14 ENCOUNTER — Ambulatory Visit (INDEPENDENT_AMBULATORY_CARE_PROVIDER_SITE_OTHER): Payer: BC Managed Care – PPO | Admitting: Surgery

## 2012-06-14 VITALS — BP 128/80 | HR 72 | Temp 97.2°F | Resp 16 | Ht 60.0 in | Wt 220.4 lb

## 2012-06-14 DIAGNOSIS — Z853 Personal history of malignant neoplasm of breast: Secondary | ICD-10-CM

## 2012-06-14 DIAGNOSIS — Z4651 Encounter for fitting and adjustment of gastric lap band: Secondary | ICD-10-CM

## 2012-06-14 DIAGNOSIS — Z452 Encounter for adjustment and management of vascular access device: Secondary | ICD-10-CM

## 2012-06-14 NOTE — Progress Notes (Signed)
[facility]  Ovidio Kin, MD,  FACS [address] [place]  [address] [place], [place]    [address] Phone:  [phone] FAX:  [phone]   Re:   ANNTIONETTE MADKINS DOB:   [birth year] MRN:   [id number]  ASSESSMENT AND PLAN: 1.  Left breast cancer.  T1c, N0.  Left breast biopsy on 10/26/2010 which showed a high grade IDC, ER - 98%, PR - 13%, Ki67 - 69%.   MRI done 01/30/2011 showed a 1.8 x 1.2 cm central left breast lesion.   Left breast lumpectomy and left sentinel lymph node axillary node biopsy on 12/10/2010.   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 She has 0 of 2 nodes involved.   Her tumor is ER and PR receptor positive, Ki-67 is 13%, and her HER-2/neu is positive. The signal was 2.3. She had an Oncotype which was 43. The distant recurrence risk was 29%.  Finished herceptin about 3 weeks ago.  She finished rad tx last January.  She has not seen either Dr. Johna Sheriff or me since October 2012.  Treating oncologist - Magrinat/Murray  2.  Lap Band, APS - B. Hoxworth - 05/26/2010  I added 0.5 cc to her lap band for a total of 4.0 cc.   3.  Hypertension 4.  Borderline DM 5.  Power port - right subclavian - D. Aleeah Greeno - 02/11/2011  Discussed removal of the port.  The risk include bleeding, infection, nerve injury.  Will schedule at a time convenient for her.  HISTORY OF PRESENT ILLNESS: Chief Complaint  Patient presents with  . Routine Post Op    eval before port removal   LIESA TSAN is a 57 y.o. (DOB: April 26, 1956)  [place] female who is a patient of SHAW,W DOUGLAS, MD and comes to me today for discussion of removal of power port.  She also is a lap band patient.  I spent time talking about her diet.  She said that she ignored our advice for diet during her chemotx, which is okay.  She does protein drinks for breakfast, because she can drink it in the car.  We talked about solid food for breakfast.  She also is not doing much exercise.  She drives 30  minutes to work every day, so she is in a car a lot.  We talked about trying to get in 1 hour of exercise 5 days a week.  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  Past Medical History  Diagnosis Date  . Hypertension   . Breast lump     left  . Gum disease   . Cancer   . 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 Diabetes mellitus     borderline  . Sleep apnea     Current Outpatient Prescriptions  Medication Sig Dispense Refill  . amLODipine-olmesartan (AZOR) 10-40 MG per tablet Take 1 tablet by mouth daily.  30 tablet  6  . Calcium Citrate-Vitamin D (CALCET CREAMY BITES PO) Take by mouth daily.       . diclofenac sodium (VOLTAREN) 1 % GEL Apply topically.      . multivitamin-iron-minerals-folic acid (CENTRUM) chewable tablet Chew 1 tablet by mouth daily.        . tamoxifen (NOLVADEX) 20 MG tablet Take 20 mg by mouth every other day.       . [DISCONTINUED] prochlorperazine (COMPAZINE) 10 MG tablet       . [  DISCONTINUED] triamterene-hydrochlorothiazide (DYAZIDE) 37.5-25 MG per capsule Take 1 each (1 capsule total) by mouth every morning.  30 capsule  0   No current facility-administered medications for this visit.   Facility-Administered Medications Ordered in Other Visits  Medication Dose Route Frequency Provider Last Rate Last Dose  . 0.9 %  sodium chloride infusion   Intravenous Once Amy Allegra Grana, PA      . sodium chloride 0.9 % injection 10 mL  10 mL Intracatheter PRN Catalina Gravel, PA   10 mL at 05/06/11 1721   Social History: She teaches at [employer]. I operated on her Aunt Iris.  PHYSICAL EXAM: BP 128/80  Pulse 72  Temp 97.2 F (36.2 C) (Temporal)  Resp 16  Ht 5' (1.524 m)  Wt 220 lb 6.4 oz (99.973 kg)  BMI 43.04 kg/m2  LMP 10/06/2010  HEENT:  Pupils equal.  Dentition good. NECK:  Supple.  No thyroid mass. LYMPH NODES:  No cervical, supraclavicular, or axillary adenopathy. BREASTS -  RIGHT:  No  palpable mass or nodule.  No nipple discharge.   LEFT:  No palpable mass or nodule.  No nipple discharge. UPPER EXTREMITIES:  No evidence of lymphedema.  Abdomen:  Soft.  Port in good position in the RUQ.  Procedure: I accessed her lap band.  She had 3.5 cc in the lap band.  I added 0.5 cc.  Her total is 4.0 cc.  She drank water and it was, maybe, a little slow going though, but she wanted to try her new amount.  DATA REVIEWED: Mammogram at [facility] about July 2013.  Ovidio Kin, MD, FACS Office:  [phone]

## 2012-06-15 ENCOUNTER — Telehealth (INDEPENDENT_AMBULATORY_CARE_PROVIDER_SITE_OTHER): Payer: Self-pay

## 2012-06-15 NOTE — Telephone Encounter (Signed)
Pt aware of P/O appt 07/14/12 @845  am

## 2012-06-20 ENCOUNTER — Ambulatory Visit: Payer: BC Managed Care – PPO

## 2012-06-20 ENCOUNTER — Ambulatory Visit (INDEPENDENT_AMBULATORY_CARE_PROVIDER_SITE_OTHER): Payer: BC Managed Care – PPO | Admitting: Internal Medicine

## 2012-06-20 VITALS — BP 136/88 | HR 76 | Temp 98.2°F | Resp 16 | Ht 60.0 in | Wt 219.8 lb

## 2012-06-20 DIAGNOSIS — J9801 Acute bronchospasm: Secondary | ICD-10-CM

## 2012-06-20 DIAGNOSIS — D72819 Decreased white blood cell count, unspecified: Secondary | ICD-10-CM

## 2012-06-20 DIAGNOSIS — R05 Cough: Secondary | ICD-10-CM

## 2012-06-20 DIAGNOSIS — R059 Cough, unspecified: Secondary | ICD-10-CM

## 2012-06-20 DIAGNOSIS — R062 Wheezing: Secondary | ICD-10-CM

## 2012-06-20 DIAGNOSIS — R509 Fever, unspecified: Secondary | ICD-10-CM

## 2012-06-20 LAB — POCT CBC
MCH, POC: 26.5 pg — AB (ref 27–31.2)
MCV: 84.2 fL (ref 80–97)
MID (cbc): 0.2 (ref 0–0.9)
POC LYMPH PERCENT: 41.4 %L (ref 10–50)
Platelet Count, POC: 272 10*3/uL (ref 142–424)
RDW, POC: 14.5 %
WBC: 2.6 10*3/uL — AB (ref 4.6–10.2)

## 2012-06-20 MED ORDER — ALBUTEROL SULFATE (2.5 MG/3ML) 0.083% IN NEBU
2.5000 mg | INHALATION_SOLUTION | Freq: Once | RESPIRATORY_TRACT | Status: AC
  Administered 2012-06-20: 2.5 mg via RESPIRATORY_TRACT

## 2012-06-20 MED ORDER — IPRATROPIUM BROMIDE 0.02 % IN SOLN
0.5000 mg | Freq: Once | RESPIRATORY_TRACT | Status: AC
  Administered 2012-06-20: 0.5 mg via RESPIRATORY_TRACT

## 2012-06-20 MED ORDER — AZITHROMYCIN 500 MG PO TABS: 500.0000 mg | ORAL_TABLET | Freq: Every day | ORAL | Status: DC

## 2012-06-20 MED ORDER — ALBUTEROL SULFATE HFA 108 (90 BASE) MCG/ACT IN AERS: 2.0000 | INHALATION_SPRAY | Freq: Four times a day (QID) | RESPIRATORY_TRACT | Status: DC | PRN

## 2012-06-20 MED ORDER — HYDROCODONE-ACETAMINOPHEN 7.5-325 MG/15ML PO SOLN: 5.0000 mL | Freq: Four times a day (QID) | ORAL | Status: DC | PRN

## 2012-06-20 NOTE — Patient Instructions (Signed)

## 2012-06-20 NOTE — Progress Notes (Signed)
  Subjective:    Patient ID: [name], female    DOB: [birth year], 57 y.o.   MRN: [id number]  HPI Just finishing chemo for breast cancer, has not been sick for a long time. C/o cough, wheezing, fever, copious yellow sputum. No sob or cp. Has not wheezed in the past with chest infections. General health good other wise.   Review of Systems obesity    Objective:   Physical Exam  Vitals reviewed. Constitutional: She is oriented to person, place, and time. She appears well-nourished. No distress.  HENT:  Right Ear: External ear normal.  Left Ear: External ear normal.  Nose: Mucosal edema and rhinorrhea present. Right sinus exhibits no maxillary sinus tenderness and no frontal sinus tenderness. Left sinus exhibits no maxillary sinus tenderness and no frontal sinus tenderness.  Mouth/Throat: Oropharynx is clear and moist.  Cardiovascular: Normal rate, regular rhythm and normal heart sounds.   Pulmonary/Chest: Effort normal. No respiratory distress. She has wheezes. She has rales. She exhibits no tenderness.  Neurological: She is alert and oriented to person, place, and time. No cranial nerve deficit. Coordination normal.  Skin: No rash noted.  Psychiatric: She has a normal mood and affect.  Rales more left than right  [facility] reading (PRIMARY) by  Dr.Lonnie Reth CXR Nebulizer   Results for orders placed in visit on 06/20/12  POCT CBC      Result Value Range   WBC 2.6 (*) 4.6 - 10.2 K/uL   Lymph, poc 1.1  0.6 - 3.4   POC LYMPH PERCENT 41.4  10 - 50 %L   MID (cbc) 0.2  0 - 0.9   POC MID % 9.1  0 - 12 %M   POC Granulocyte 1.3 (*) 2 - 6.9   Granulocyte percent 49.5  37 - 80 %G   RBC 4.87  4.04 - 5.48 M/uL   Hemoglobin 12.9  12.2 - 16.2 g/dL   HCT, POC 78.2  95.6 - 47.9 %   MCV 84.2  80 - 97 fL   MCH, POC 26.5 (*) 27 - 31.2 pg   MCHC 31.5 (*) 31.8 - 35.4 g/dL   RDW, POC 21.3     Platelet Count, POC 272  142 - 424 K/uL   MPV 8.6  0 - 99.8 fL        Assessment & Plan:   Leukopenia/Chemo for breast cancer Asthmatic bronchitis Zithromax 500mg /albuterol hfa/Lortab elixir

## 2012-06-26 DIAGNOSIS — Z452 Encounter for adjustment and management of vascular access device: Secondary | ICD-10-CM

## 2012-07-08 HISTORY — PX: OTHER SURGICAL HISTORY: SHX169

## 2012-07-13 ENCOUNTER — Encounter (INDEPENDENT_AMBULATORY_CARE_PROVIDER_SITE_OTHER): Payer: Self-pay | Admitting: Surgery

## 2012-07-13 ENCOUNTER — Ambulatory Visit (INDEPENDENT_AMBULATORY_CARE_PROVIDER_SITE_OTHER): Payer: BC Managed Care – PPO | Admitting: Surgery

## 2012-07-13 VITALS — BP 142/92 | HR 76 | Temp 97.3°F | Resp 16 | Ht 60.0 in | Wt 219.0 lb

## 2012-07-13 DIAGNOSIS — C50912 Malignant neoplasm of unspecified site of left female breast: Secondary | ICD-10-CM

## 2012-07-13 DIAGNOSIS — Z853 Personal history of malignant neoplasm of breast: Secondary | ICD-10-CM

## 2012-07-13 DIAGNOSIS — C50919 Malignant neoplasm of unspecified site of unspecified female breast: Secondary | ICD-10-CM

## 2012-07-13 NOTE — Progress Notes (Signed)
[facility]  Ovidio Kin, MD,  FACS [address] [place]  [address] [place], [place]    [address] Phone:  [phone] FAX:  [phone]   Re:   [name] DOB:   1956/03/09 MRN:   [id number]  ASSESSMENT AND PLAN: 1.  Left breast cancer.  T1c, N0.  Left breast biopsy on 10/26/2010 which showed a high grade IDC, ER - 98%, PR - 13%, Ki67 - 69%.   MRI done 01/30/2011 showed a 1.8 x 1.2 cm central left breast lesion.   Left breast lumpectomy and left sentinel lymph node axillary node biopsy on 12/10/2010.   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 She has 0 of 2 nodes involved.   Her tumor is ER and PR receptor positive, Ki-67 is 13%, and her HER-2/neu is positive. The signal was 2.3. She had an Oncotype which was 43. The distant recurrence risk was 29%.  Finished herceptin in Jan 2014.  She finished rad tx January 2013.   Treating oncologist - Magrinat/Murray  I will see her back in 6 months to check both her breast and her lap band.  2.  Lap Band, APS - B. Hoxworth - 05/26/2010  I added 0.5 cc to her lap band for a total of 4.0 cc.  She had some night cough after I last filled her.  She is probably a little tight.  She does not want any fluid removed today and she is loosing some weight.   3.  Hypertension 4.  Borderline DM 5.  Power port - right subclavian - D. Newman - 02/11/2011  Removed 06/26/2012 - wound looks good. 6.  Trouble with both feet  They are sore in that she has been on them all day.  Sees Dr. Maureen Ralphs  HISTORY OF PRESENT ILLNESS: Chief Complaint  Patient presents with  . Follow-up    /p port removal   [name] is a 57 y.o. (DOB: 1955-09-22)  AA female who is a patient of SHAW,W DOUGLAS, MD and comes to me today for follow up of power port removal.  Her wound looks good.  Weight talked about weight loss issues.  It sounds like her lap band is a little tight.  Weight Loss Issues: She also is a lap band patient.  I  spent time talking about her diet.  She said that she ignored our advice for diet during her chemotx, which is okay.  She does protein drinks for breakfast, because she can drink it in the car.  We talked about solid food for breakfast.  She also is not doing much exercise.  She drives 30 minutes to work every day, so she is in a car a lot.  We talked about trying to get in 1 hour of exercise 5 days a week.  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  Past Medical History  Diagnosis Date  . Hypertension   . Breast lump     left  . Gum disease   . Cancer   . 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 Diabetes mellitus     borderline  . Sleep apnea     Current Outpatient Prescriptions  Medication Sig Dispense Refill  . albuterol (PROVENTIL HFA;VENTOLIN HFA) 108 (90 BASE) MCG/ACT inhaler Inhale 2 puffs into the lungs every 6 (six) hours as needed for wheezing.  1 Inhaler  3  .  amLODipine-olmesartan (AZOR) 10-40 MG per tablet Take 1 tablet by mouth daily.  30 tablet  6  . azithromycin (ZITHROMAX) 500 MG tablet Take 1 tablet (500 mg total) by mouth daily.  5 tablet  0  . Calcium Citrate-Vitamin D (CALCET CREAMY BITES PO) Take by mouth daily.       . diclofenac sodium (VOLTAREN) 1 % GEL Apply topically.      [name] HYDROcodone-acetaminophen (HYCET) 7.5-325 mg/15 ml solution Take 5 mLs by mouth every 6 (six) hours as needed for pain (or cough).  240 mL  0  . multivitamin-iron-minerals-folic acid (CENTRUM) chewable tablet Chew 1 tablet by mouth daily.        . tamoxifen (NOLVADEX) 20 MG tablet Take 20 mg by mouth every other day.       . [DISCONTINUED] prochlorperazine (COMPAZINE) 10 MG tablet       . [DISCONTINUED] triamterene-hydrochlorothiazide (DYAZIDE) 37.5-25 MG per capsule Take 1 each (1 capsule total) by mouth every morning.  30 capsule  0   No current facility-administered medications for this visit.   Facility-Administered Medications  Ordered in Other Visits  Medication Dose Route Frequency Provider Last Rate Last Dose  . 0.9 %  sodium chloride infusion   Intravenous Once Amy G Berry, PA-C      . sodium chloride 0.9 % injection 10 mL  10 mL Intracatheter PRN Amy Allegra Grana, PA-C   10 mL at 05/06/11 1721   Social History: She teaches at [employer]. I operated on her Aunt Iris.  PHYSICAL EXAM: BP 142/92  Pulse 76  Temp(Src) 97.3 F (36.3 C) (Temporal)  Resp 16  Ht 5' (1.524 m)  Wt 219 lb (99.338 kg)  BMI 42.77 kg/m2  LMP 10/06/2010  HEENT:  Pupils equal.  Dentition good. NECK:  Supple.  No thyroid mass. Chest:  Right upper inner chest port site looks good.  She feels some scar tissue and there is a little pigmentation of the skin.  I told her that most of this would improve with time.  DATA REVIEWED: None new.  Ovidio Kin, MD, FACS Office:  [phone]

## 2012-07-14 ENCOUNTER — Encounter (INDEPENDENT_AMBULATORY_CARE_PROVIDER_SITE_OTHER): Payer: BC Managed Care – PPO | Admitting: Surgery

## 2012-08-12 ENCOUNTER — Other Ambulatory Visit: Payer: Self-pay | Admitting: Physician Assistant

## 2012-08-14 ENCOUNTER — Other Ambulatory Visit: Payer: Self-pay | Admitting: Physician Assistant

## 2012-08-14 DIAGNOSIS — C50912 Malignant neoplasm of unspecified site of left female breast: Secondary | ICD-10-CM

## 2012-08-15 ENCOUNTER — Telehealth: Payer: Self-pay | Admitting: *Deleted

## 2012-08-15 NOTE — Telephone Encounter (Signed)
Lm made pt aware that she is scheduled for 09/07/12 labs and ov on 09/14/12.also made her aware that i will place letter/cal.

## 2012-08-28 ENCOUNTER — Telehealth: Payer: Self-pay | Admitting: *Deleted

## 2012-08-28 NOTE — Telephone Encounter (Signed)
Pt called stating that she received her schedule in the mail. She want her lab and ov on the same day and requested for 09/08/12. gv appt for 09/08/12 with a lab and ov. td

## 2012-09-07 ENCOUNTER — Other Ambulatory Visit: Payer: BC Managed Care – PPO | Admitting: Lab

## 2012-09-08 ENCOUNTER — Other Ambulatory Visit (HOSPITAL_BASED_OUTPATIENT_CLINIC_OR_DEPARTMENT_OTHER): Payer: BC Managed Care – PPO | Admitting: Lab

## 2012-09-08 ENCOUNTER — Encounter: Payer: Self-pay | Admitting: Physician Assistant

## 2012-09-08 ENCOUNTER — Ambulatory Visit (HOSPITAL_BASED_OUTPATIENT_CLINIC_OR_DEPARTMENT_OTHER): Payer: BC Managed Care – PPO | Admitting: Physician Assistant

## 2012-09-08 ENCOUNTER — Telehealth: Payer: Self-pay | Admitting: Oncology

## 2012-09-08 VITALS — BP 128/84 | HR 77 | Temp 98.7°F | Resp 20 | Ht 60.0 in | Wt 217.7 lb

## 2012-09-08 DIAGNOSIS — C50319 Malignant neoplasm of lower-inner quadrant of unspecified female breast: Secondary | ICD-10-CM

## 2012-09-08 DIAGNOSIS — Z170 Estrogen receptor positive status [ER+]: Secondary | ICD-10-CM

## 2012-09-08 DIAGNOSIS — C50912 Malignant neoplasm of unspecified site of left female breast: Secondary | ICD-10-CM

## 2012-09-08 LAB — CBC WITH DIFFERENTIAL/PLATELET
BASO%: 0.4 % (ref 0.0–2.0)
LYMPH%: 38.2 % (ref 14.0–49.7)
MCHC: 32.9 g/dL (ref 31.5–36.0)
MONO#: 0.4 10*3/uL (ref 0.1–0.9)
NEUT#: 2.5 10*3/uL (ref 1.5–6.5)
RBC: 4.58 10*6/uL (ref 3.70–5.45)
RDW: 14.5 % (ref 11.2–14.5)
WBC: 4.7 10*3/uL (ref 3.9–10.3)
lymph#: 1.8 10*3/uL (ref 0.9–3.3)

## 2012-09-08 LAB — COMPREHENSIVE METABOLIC PANEL (CC13)
ALT: 11 U/L (ref 0–55)
Albumin: 3.8 g/dL (ref 3.5–5.0)
CO2: 27 mEq/L (ref 22–29)
Potassium: 4.1 mEq/L (ref 3.5–5.1)
Sodium: 140 mEq/L (ref 136–145)
Total Bilirubin: 0.45 mg/dL (ref 0.20–1.20)
Total Protein: 7.5 g/dL (ref 6.4–8.3)

## 2012-09-08 MED ORDER — TAMOXIFEN CITRATE 20 MG PO TABS: 20.0000 mg | ORAL_TABLET | Freq: Every day | ORAL | Status: DC

## 2012-09-08 NOTE — Progress Notes (Signed)
ID: REXANNE INOCENCIO   DOB: February 27, 1956  MR#: [id number]  WUJ#:[id number]  PCP: Kari Baars, MD GYN: SUOvidio Kin OTHER MD:   HISTORY OF PRESENT ILLNESS: Elvy Mclarty is a 57 year old [place] woman referred by Dr. Clelia Croft for treatment of left breast cancer.  The patient underwent screening bilateral mammography July 04, 2009, at [facility] and this showed only a scattered fibroglandular densities. However, repeat screening exam Sep 29, 2010 showed a potential abnormality in the left breast, retroareolar region. The patient was recalled for additional views Oct 08, 2010 and Dr. Derinda Late was able to locate the nodular density noted on the screening exam. It measured approximately 8 mm. Ultrasound showed a second nodular density just lateral to the nipple areolar complex. In that area, there was what appears to be either a small cluster of cysts or perhaps a solid nodule. In particular, the nodule seen in the 3 o'clock position was felt to be most likely a fibroadenoma. The other area, however, required biopsy and this was performed October 26, 2010. The pathology from this procedure ([id number]) showed a high-grade invasive ductal carcinoma which was estrogen-receptor positive at 98%, progesterone-receptor positive at 13% with an elevated MIB-1 at 69% and no evidence of HER-2 amplification, the ratio by CISH being 1.32.  With this information the patient was referred for bilateral breast MRIs and these were performed October 30, 2010. In the left breast there was a 1.8 cm ill-defined area in the central breast associated with post biopsy changes. There were really no other suspicious areas in either breast and no bony abnormalities and no abnormal appearing or enlarged lymph nodes.  Patient underwent left lumpectomy and sentinel lymph node sampling December 10, 2010 ([id number]) for a 1.1-cm invasive ductal carcinoma, grade 3, with 0/2 sentinel lymph nodes involved and so T1c N0 (stage I); the tumor being estrogen  receptor 98% and progesterone receptor 13% positive, with no HER-2 amplification (by initial determination, but amplified on repeat with a ratio of 2.30); with an Oncotype DX score of 43 predicting a 29% risk of recurrence if she takes 5 years of tamoxifen. (Her-2 was negative on the Oncotype DX determination, which uses a completely different method).  Her subsequent history is as detailed below.  INTERVAL HISTORY: Haunani returns today for followup of her left breast carcinoma. She completed her year of trastuzumab at the end of December 2013.  She started on tamoxifen in March 2013, but admits that she has really not been taking it consistently.  She thought it was causing some leg cramps, so she started taking it every other day, and eventually wasn't taking it at all.  She has hot flashes although she tells me they are not intolerable.  She has mild vaginal dryness, but no bleeding.  Interval history is remarkable for [place] having had her port removed since her last visit here.  She continues to work full-time and stays very busy.  She goes to the gym a couple times a week and also goes to a dance class at the [employer].     REVIEW OF SYSTEMS: Tateanna denies any fevers, chills, rashes, bruising, or abnormal bleeding.  She is eating and drinking well, with no nausea and no change in bowel or bladder habits.  No cough, shortness of breath, chest pain or palpitations.  She denies abnormal headaches.  She currently has no problems with cramping and denies any unusual pain.  No peripheral swelling.  Otherwise a detailed review of systems today was noncontributory.  PAST MEDICAL HISTORY: Past Medical History  Diagnosis Date  . Hypertension   . Breast lump     left  . Gum disease   . Cancer   . 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 Diabetes mellitus     borderline  . Sleep apnea     PAST SURGICAL HISTORY: Past Surgical History  Procedure  Laterality Date  . Laparoscopic gastric banding    . Foot mass excision    . Soft tissue cyst excision      back cyst  . Vaginal mass excision    . Mass excision      back  . Portacath placement  02/11/11  . Resectoscopic polypectomy  2001    MYOMECTOMY  . Breast lumpectomy  2012    left    FAMILY HISTORY Family History  Problem Relation Age of Onset  . Cancer Maternal Uncle 70    prostate  . Alcohol abuse Father   . Cirrhosis Father   . Cancer Mother     breast   GYNECOLOGIC HISTORY: She had menarche at age 78, last menstrual period was late May 2012. Her periods however, are not regular and it had been almost a year before that. She has never used hormone replacement. She is GX P0.   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 She attends a [employer]. She goes to the gym at least three times a week.     ADVANCED DIRECTIVES:  HEALTH MAINTENANCE: History  Substance Use Topics  . Smoking status: Former Smoker -- 1.00 packs/day for 21 years    Types: Cigarettes    Quit date: 03/25/1999  . Smokeless tobacco: Never Used  . Alcohol Use: No     Colonoscopy:  PAP:  Bone density:  Lipid panel:  No Known Allergies  Current Outpatient Prescriptions  Medication Sig Dispense Refill  . albuterol (PROVENTIL HFA;VENTOLIN HFA) 108 (90 BASE) MCG/ACT inhaler Inhale 2 puffs into the lungs every 6 (six) hours as needed for wheezing.  1 Inhaler  3  . amLODipine-olmesartan (AZOR) 10-40 MG per tablet Take 1 tablet by mouth daily.  30 tablet  6  . b complex vitamins capsule Take 1 capsule by mouth daily.      . Calcium Citrate-Vitamin D (CALCET CREAMY BITES PO) Take by mouth daily.       . multivitamin-iron-minerals-folic acid (CENTRUM) chewable tablet Chew 1 tablet by mouth daily.        . tamoxifen (NOLVADEX) 20 MG tablet Take 1 tablet (20 mg total) by mouth daily.  30 tablet  5  . diclofenac sodium (VOLTAREN) 1 % GEL Apply topically.       . [DISCONTINUED] prochlorperazine (COMPAZINE) 10 MG tablet       . [DISCONTINUED] triamterene-hydrochlorothiazide (DYAZIDE) 37.5-25 MG per capsule Take 1 each (1 capsule total) by mouth every morning.  30 capsule  0   No current facility-administered medications for this visit.   Facility-Administered Medications Ordered in Other Visits  Medication Dose Route Frequency Provider Last Rate Last Dose  . 0.9 %  sodium chloride infusion   Intravenous Once Maizy Davanzo G Connie Lasater, PA-C      . sodium chloride 0.9 % injection 10 mL  10 mL Intracatheter PRN Aristotelis Vilardi Allegra Grana, PA-C   10 mL at 05/06/11 1721    OBJECTIVE: Middle-aged [place] American female who looks well and is in no acute distress Filed  Vitals:   09/08/12 1342  BP: 128/84  Pulse: 77  Temp: 98.7 F (37.1 C)  Resp: 20     Body mass index is 42.52 kg/(m^2).    ECOG FS: 0 Filed Weights   09/08/12 1342  Weight: 217 lb 11.2 oz (98.748 kg)   Physical Exam: HEENT:  Sclerae anicteric.  Oropharynx clear. Nodes:  No cervical or supraclavicular adenopathy  Breast Exam:  The right breast is unremarkable. The port has been removed from the right chest wall, well healed incision.The left breast is status post lumpectomy and radiation. There is some distortion in the inferior portion where the scar is, but here is no evidence of local recurrence. Left axilla is benign with no palpable adenopathy Lungs:  No rales or rhonchi, clear to auscultation  Heart:  Regular rate and rhythm.  No murmur appreciated Abdomen:  Soft, obese, nontender, positive bowel sounds Musculoskeletal:  No focal spinal tenderness to palpation.  Extremities: No peripheral edema Neuro:  Nonfocal. Well oriented. Positive affect.   LAB RESULTS: Lab Results  Component Value Date   WBC 4.7 09/08/2012   NEUTROABS 2.5 09/08/2012   HGB 12.4 09/08/2012   HCT 37.6 09/08/2012   MCV 82.0 09/08/2012   PLT 260 09/08/2012      Chemistry      Component Value Date/Time   NA 140 09/08/2012 1320   NA  140 12/31/2011 1034   K 4.1 09/08/2012 1320   K 3.8 12/31/2011 1034   CL 105 09/08/2012 1320   CL 106 12/31/2011 1034   CO2 27 09/08/2012 1320   CO2 26 12/31/2011 1034   BUN 17.2 09/08/2012 1320   BUN 11 12/31/2011 1034   CREATININE 0.8 09/08/2012 1320   CREATININE 0.74 12/31/2011 1034      Component Value Date/Time   CALCIUM 9.5 09/08/2012 1320   CALCIUM 8.9 12/31/2011 1034   ALKPHOS 63 09/08/2012 1320   ALKPHOS 37* 12/31/2011 1034   AST 14 09/08/2012 1320   AST 15 12/31/2011 1034   ALT 11 09/08/2012 1320   ALT 11 12/31/2011 1034   BILITOT 0.45 09/08/2012 1320   BILITOT 0.3 12/31/2011 1034       STUDIES: Most recent bilateral mammogram at [facility] on 06/01/2012 was unremarkable.   ASSESSMENT: 58 year old [place] woman   1. Status post left lumpectomy and sentinel lymph node sampling August 2012 for a T1cN0, stage IA invasive ductal carcinoma, grade 3, estrogen receptor 98% and progesterone receptor 13% positive, HER-2/neu amplified with a ratio of 2.3.   2. Oncotype showed a recurrence score of 43 ( "high risk"), predicting a 29% risk of recurrence if the patient's only adjuvant treatment was tamoxifen   3. received adjuvant docetaxel/ cyclophosphamide x4, completed December 2012   4. trastuzumab started 04/15/2011, completed 05/05/2012; most recent echo 02/18/2012  5. radiation therapy, completed July 23, 2011  6. began on tamoxifen March 2013, but admits that she has not always taken it consistently  PLAN: Sruti tells me she wants to go back on her tamoxifen and wants to be more consistent with taking it.  I have given her a new prescription, and I will plan on seeing her back in about 3 months to assess her progress.  She agrees, however, to call me prior to that time if she has poor tolerance so we can discuss any issues she might be having.    Ozelle voiced understanding and agreement with this plan.   Oletta Buehring    09/08/2012

## 2012-09-14 ENCOUNTER — Ambulatory Visit: Payer: BC Managed Care – PPO | Admitting: Physician Assistant

## 2012-09-25 ENCOUNTER — Encounter: Payer: Self-pay | Admitting: Gynecology

## 2012-09-25 ENCOUNTER — Ambulatory Visit (INDEPENDENT_AMBULATORY_CARE_PROVIDER_SITE_OTHER): Payer: BC Managed Care – PPO | Admitting: Gynecology

## 2012-09-25 ENCOUNTER — Other Ambulatory Visit (HOSPITAL_COMMUNITY)
Admission: RE | Admit: 2012-09-25 | Discharge: 2012-09-25 | Disposition: A | Discharge status: Home / Self Care | Payer: BC Managed Care – PPO | Source: Ambulatory Visit | Attending: Gynecology | Admitting: Gynecology

## 2012-09-25 VITALS — BP 124/78 | Ht 59.25 in | Wt 216.0 lb

## 2012-09-25 DIAGNOSIS — Z1151 Encounter for screening for human papillomavirus (HPV): Secondary | ICD-10-CM | POA: Insufficient documentation

## 2012-09-25 DIAGNOSIS — Z01419 Encounter for gynecological examination (general) (routine) without abnormal findings: Secondary | ICD-10-CM

## 2012-09-25 DIAGNOSIS — Z853 Personal history of malignant neoplasm of breast: Secondary | ICD-10-CM

## 2012-09-25 DIAGNOSIS — N951 Menopausal and female climacteric states: Secondary | ICD-10-CM

## 2012-09-25 DIAGNOSIS — J020 Streptococcal pharyngitis: Secondary | ICD-10-CM

## 2012-09-25 NOTE — Progress Notes (Signed)
Tammy GRUDZIEN 13-Oct-1955 [id number]   History:    57 y.o.  for annual gyn exam complaining of slight throat irritation for some days. She's had a mild nonproductive cough at times. Last week she felt some tiredness and fatigue. Denied any true fever. Patient with past history of breast cancer with history as follows:  07/04/2009 screening bilateral mammogram demonstrated scattered fibroglandular densities. Repeat screening May 2012 questionable abnormality in the left breast retroareolar region. Ultrasound is shown nodular densities just lateral to the nipple areolar complex there was questionable cluster of cysts or perhaps a solid nodule. Pathology report from that  Biopsy on 10/26/2010 demonstrated high-grade invasive ductal carcinoma which was estrogen and progesterone receptor positive. Patient underwent left lumpectomy and sentinel node sampling on 12/10/2010 an invasive ductal carcinoma, grade 3, with 0/2 sentinel nodes were involved and was given a stage :T1c N0 (stage I) patient received  adjuvant chemotherapy consisting of 4 cycles of q. three-week docetaxel and cyclophosphamide given along with trastuzumab, to be followed by radiation and antiestrogen therapy.The trastuzumab  was continued for one year and has now been on tamoxifen. Patient denies any vaginal bleeding. All her lab work were done by her oncologist. Her last colonoscopy was 5 years ago. Her last bone density study was in 2011. She states her mammogram was in 2014 and we do not have that report. Patient's mother also had breast cancer.     Past medical history,surgical history, family history and social history were all reviewed and documented in the EPIC chart.  Gynecologic History Patient's last menstrual period was 10/06/2010. Contraception: none Last Pap: 2011. Results were: normal Last mammogram: 2014. Results were: patient reports that it was normal I do not have the report  Obstetric History OB History   Grav Para  Term Preterm Abortions TAB SAB Ect Mult Living   1 0   1  1   0     # Outc Date GA Lbr Len/2nd Wgt Sex Del Anes PTL Lv   1 SAB                ROS: A ROS was performed and pertinent positives and negatives are included in the history.  GENERAL: No fevers or chills. HEENT: No change in vision, no earache, sore throat or sinus congestion.was complaining of oropharyngeal irritation NECK: No pain or stiffness. CARDIOVASCULAR: No chest pain or pressure. No palpitations. PULMONARY: No shortness of breath, cough or wheeze. GASTROINTESTINAL: No abdominal pain, nausea, vomiting or diarrhea, melena or bright red blood per rectum. GENITOURINARY: No urinary frequency, urgency, hesitancy or dysuria. MUSCULOSKELETAL: No joint or muscle pain, no back pain, no recent trauma. DERMATOLOGIC: No rash, no itching, no lesions. ENDOCRINE: No polyuria, polydipsia, no heat or cold intolerance. No recent change in weight. HEMATOLOGICAL: No anemia or easy bruising or bleeding. NEUROLOGIC: No headache, seizures, numbness, tingling or weakness. PSYCHIATRIC: No depression, no loss of interest in normal activity or change in sleep pattern.     Exam: chaperone present  BP 124/78  Ht 4' 11.25" (1.505 m)  Wt 216 lb (97.977 kg)  BMI 43.26 kg/m2  LMP 10/06/2010  Body mass index is 43.26 kg/(m^2).  General appearance : Well developed well nourished female. No acute distress HEENT: Neck supple, trachea midline, no carotid bruits, no thyroidmegaly Lungs: Clear to auscultation, no rhonchi or wheezes, or rib retractions  Heart: Regular rate and rhythm, no murmurs or gallops Breast:Examined in sitting and supine position were symmetrical in appearance, no palpable masses or  tenderness,  no skin retraction, no nipple inversion, no nipple discharge, no skin discoloration, no axillary or supraclavicular lymphadenopathy Abdomen: no palpable masses or tenderness, no rebound or guarding Extremities: no edema or skin discoloration or  tenderness  Pelvic:  Bartholin, Urethra, Skene Glands: Within normal limits             Vagina: No gross lesions or discharge  Cervix: No gross lesions or discharge  Uterus  anteverted, normal size, shape and consistency, non-tender and mobile  Adnexa  Without masses or tenderness  Anus and perineum  normal   Rectovaginal  normal sphincter tone without palpated masses or tenderness             Hemoccult cards provided     Assessment/Plan:  57 y.o. 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 All her lab work were drawn by her oncologist. We did do a Pap smear today. The new guidelines were discussed. She will schedule an ultrasound in June to better assess her ovaries. And she will schedule her bone density study which is overdue for sometime in July. Hemoccult cards were provided her to submit to the office for testing. A strep throat culture was obtained today. Will discuss with her oncologist and a BRCA1 BRCA2 gene mutation study was done and see if she would be a candidate for prophylactic bilateral salpingo-oophorectomy laparoscopically.    Ok Edwards MD, 2:09 PM 09/25/2012

## 2012-09-25 NOTE — Patient Instructions (Addendum)
BRCA-1 and BRCA-2 BRCA-1 and BRCA-2 are 2 genes that are linked with hereditary breast and ovarian cancers. About 200,000 women are diagnosed with invasive breast cancer each year and about 23,000 with ovarian cancer (according to the American Cancer Society). Of these cancers, about 5% to 10% will be due to a mutation in one of the BRCA genes. Men can also inherit an increased risk of developing breast cancer, primarily from an alteration in the BRCA-2 gene.  Individuals with mutations in BRCA1 or BRCA2 have significantly elevated risks for breast cancer (up to 80% lifetime risk), ovarian cancer (up to 40% lifetime risk), bilateral breast cancer and other types of cancers. BRCA mutations are inherited and passed from generation to generation. One half of the time, they are passed from the father's side of the family.  The DNA in white blood cells is used to detect mutations in the BRCA genes. While the gene products (proteins) of the BRCA genes act only in breast and ovarian tissue, the genes are present in every cell of the body and blood is the most easily accessible source of that DNA. PREPARATION FOR TEST The test for BRCA mutations is done on a blood sample collected by needle from a vein in the arm. The test does not require surgical biopsy of breast or ovarian tissue. Tetanus, Diphtheria, Pertussis (Tdap) Vaccine What You Need to Know WHY GET VACCINATED? Tetanus, diphtheria and pertussis can be very serious diseases, even for adolescents and adults. Tdap vaccine can protect [place] from these diseases. TETANUS (Lockjaw) causes painful muscle tightening and stiffness, usually all over the body.  It can lead to tightening of muscles in the head and neck so you can't open your mouth, swallow, or sometimes even breathe. Tetanus kills about 1 out of 5 people who are infected. DIPHTHERIA can cause a thick coating to form in the back of the throat.  It can lead to breathing problems, paralysis, heart  failure, and death. PERTUSSIS (Whooping Cough) causes severe coughing spells, which can cause difficulty breathing, vomiting and disturbed sleep.  It can also lead to weight loss, incontinence, and rib fractures. Up to 2 in 100 adolescents and 5 in 100 adults with pertussis are hospitalized or have complications, which could include pneumonia and death. These diseases are caused by bacteria. Diphtheria and pertussis are spread from person to person through coughing or sneezing. Tetanus enters the body through cuts, scratches, or wounds. Before vaccines, the [place] saw as many as 200,000 cases a year of diphtheria and pertussis, and hundreds of cases of tetanus. Since vaccination began, tetanus and diphtheria have dropped by about 99% and pertussis by about 80%. TDAP VACCINE Tdap vaccine can protect adolescents and adults from tetanus, diphtheria, and pertussis. One dose of Tdap is routinely given at age 72 or 22. People who did not get Tdap at that age should get it as soon as possible. Tdap is especially important for health care professionals and anyone having close contact with a baby younger than 12 months. Pregnant women should get a dose of Tdap during every pregnancy, to protect the newborn from pertussis. Infants are most at risk for severe, life-threatening complications from pertussis. A similar vaccine, called Td, protects from tetanus and diphtheria, but not pertussis. A Td booster should be given every 10 years. Tdap may be given as one of these boosters if you have not already gotten a dose. Tdap may also be given after a severe cut or burn to prevent tetanus infection.  Your doctor can give you more information. Tdap may safely be given at the same time as other vaccines. SOME PEOPLE SHOULD NOT GET THIS VACCINE  If you ever had a life-threatening allergic reaction after a dose of any tetanus, diphtheria, or pertussis containing vaccine, OR if you have a severe allergy to any part  of this vaccine, you should not get Tdap. Tell your doctor if you have any severe allergies.  If you had a coma, or long or multiple seizures within 7 days after a childhood dose of DTP or DTaP, you should not get Tdap, unless a cause other than the vaccine was found. You can still get Td.  Talk to your doctor if you:  have epilepsy or another nervous system problem,  had severe pain or swelling after any vaccine containing diphtheria, tetanus or pertussis,  ever had Guillain-Barr Syndrome (GBS),  aren't feeling well on the day the shot is scheduled. RISKS OF A VACCINE REACTION With any medicine, including vaccines, there is a chance of side effects. These are usually mild and go away on their own, but serious reactions are also possible. Brief fainting spells can follow a vaccination, leading to injuries from falling. Sitting or lying down for about 15 minutes can help prevent these. Tell your doctor if you feel dizzy or light-headed, or have vision changes or ringing in the ears. Mild problems following Tdap (Did not interfere with activities)  Pain where the shot was given (about 3 in 4 adolescents or 2 in 3 adults)  Redness or swelling where the shot was given (about 1 person in 5)  Mild fever of at least 100.72F (up to about 1 in 25 adolescents or 1 in 100 adults)  Headache (about 3 or 4 people in 10)  Tiredness (about 1 person in 3 or 4)  Nausea, vomiting, diarrhea, stomach ache (up to 1 in 4 adolescents or 1 in 10 adults)  Chills, body aches, sore joints, rash, swollen glands (uncommon) Moderate problems following Tdap (Interfered with activities, but did not require medical attention)  Pain where the shot was given (about 1 in 5 adolescents or 1 in 100 adults)  Redness or swelling where the shot was given (up to about 1 in 16 adolescents or 1 in 25 adults)  Fever over 102F (about 1 in 100 adolescents or 1 in 250 adults)  Headache (about 3 in 20 adolescents or 1 in 10  adults)  Nausea, vomiting, diarrhea, stomach ache (up to 1 or 3 people in 100)  Swelling of the entire arm where the shot was given (up to about 3 in 100). Severe problems following Tdap (Unable to perform usual activities, required medical attention)  Swelling, severe pain, bleeding and redness in the arm where the shot was given (rare). A severe allergic reaction could occur after any vaccine (estimated less than 1 in a million doses). WHAT IF THERE IS A SERIOUS REACTION? What should I look for?  Look for anything that concerns you, such as signs of a severe allergic reaction, very high fever, or behavior changes. Signs of a severe allergic reaction can include hives, swelling of the face and throat, difficulty breathing, a fast heartbeat, dizziness, and weakness. These would start a few minutes to a few hours after the vaccination. What should I do?  If you think it is a severe allergic reaction or other emergency that can't wait, call 9-1-1 or get the person to the nearest hospital. Otherwise, call your doctor.  Afterward, the  reaction should be reported to the "Vaccine Adverse Event Reporting System" (VAERS). Your doctor might file this report, or you can do it yourself through the VAERS web site at www.vaers.LAgents.no, or by calling 1-9495807202. VAERS is only for reporting reactions. They do not give medical advice.  THE NATIONAL VACCINE INJURY COMPENSATION PROGRAM The National Vaccine Injury Compensation Program (VICP) is a federal program that was created to compensate people who may have been injured by certain vaccines. Persons who believe they may have been injured by a vaccine can learn about the program and about filing a claim by calling 1-(937)485-9305 or visiting the VICP website at SpiritualWord.at. HOW CAN I LEARN MORE?  Ask your doctor.  Call your local or state health department.  Contact the Centers for Disease Control and Prevention (CDC):  Call  909-260-5433 or visit CDC's website at PicCapture.uy CDC Tdap Vaccine VIS (09/16/11) Document Released: 10/26/2011 Document Reviewed: 10/26/2011 [facility] Patient Information 2013 [place], [place].  NORMAL FINDINGS No genetic mutations. Ranges for normal findings may vary among different laboratories and hospitals. You should always check with your doctor after having lab work or other tests done to discuss the meaning of your test results and whether your values are considered within normal limits. MEANING OF TEST  Your caregiver will go over the test results with you and discuss the importance and meaning of your results, as well as treatment options and the need for additional tests if necessary. OBTAINING THE TEST RESULTS It is your responsibility to obtain your test results. Ask the lab or department performing the test when and how you will get your results. OTHER THINGS TO KNOW Your test results may have implications for other family members. When one member of a family is tested for BRCA mutations, issues often arise about how or whether to share this information with other family members. Seek advice from a genetic counselor about communication of result with your family members.  Pre and post test consultation with a health care provider knowledgeable about genetic testing cannot be overemphasized.  There are many issues to be considered when preparing for a genetic test and upon learning the results, and a genetic counselor has the knowledge and experience to help you sort through them.  If the BRCA test is positive, the options include increased frequency of check-ups (e.g., mammography, blood tests for CA-125, or transvaginal ultrasonography); medications that could reduce risk (e.g., oral contraceptives or tamoxifen); or surgical removal of the ovaries or breasts. There are a number of variables involved and it is important to discuss your options with your doctor and genetic  counselor. Research studies have reported that for every 1000 women negative for BRCA mutations, between 12 and 45 of them will develop breast cancer by age 54 and between 3 and 4 will develop ovarian cancer by age 39. The risk increases with age. The test can be ordered by a doctor, preferably by one who can also offer genetic counseling. The blood sample will be sent to a laboratory that specializes in BRCA testing. The American Society of Clinical Oncology and the National Breast Cancer Coalition encourage women seeking the test to participate in long-term outcome studies to help gather information on the effectiveness of different check-up and treatment options. Document Released: 05/20/2004 Document Revised: 07/19/2011 Document Reviewed: 04/01/2008 [facility] Patient Information 2013 [place], [place].

## 2012-09-26 ENCOUNTER — Other Ambulatory Visit: Payer: Self-pay | Admitting: Oncology

## 2012-09-28 ENCOUNTER — Encounter: Payer: Self-pay | Admitting: Gynecology

## 2012-10-02 ENCOUNTER — Telehealth: Payer: Self-pay | Admitting: Gynecology

## 2012-10-02 NOTE — Telephone Encounter (Signed)
57 y.o. 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 A message had been left in her oncologist to see if he would recommend BRCA1 and BRCA2 gene mutation analysis as well as prophylactic bilateral salpingo-oophorectomy. VE no masses that I received was as follows:

## 2012-10-02 NOTE — Telephone Encounter (Signed)
Correction and from previous dictation above:  57 y.o. 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 A message had been left to her oncologist to see if he would recommend BRCA1 and BRCA2 gene mutation analysis as well as prophylactic bilateral salpingo-oophorectomy. They did not recommend BRCA1 and BRCA2 gene mutation analysis because of limited family history so did not recommend prophylactic bilateral salpingo-oophorectomy either.

## 2012-10-05 ENCOUNTER — Encounter: Payer: Self-pay | Admitting: Gynecology

## 2012-10-05 ENCOUNTER — Other Ambulatory Visit: Payer: Self-pay | Admitting: Anesthesiology

## 2012-10-05 DIAGNOSIS — Z1211 Encounter for screening for malignant neoplasm of colon: Secondary | ICD-10-CM

## 2012-10-30 ENCOUNTER — Encounter: Payer: Self-pay | Admitting: Gynecology

## 2012-10-31 ENCOUNTER — Other Ambulatory Visit: Payer: Self-pay | Admitting: *Deleted

## 2012-10-31 DIAGNOSIS — C50919 Malignant neoplasm of unspecified site of unspecified female breast: Secondary | ICD-10-CM

## 2012-11-08 ENCOUNTER — Other Ambulatory Visit: Payer: Self-pay | Admitting: Internal Medicine

## 2012-11-08 DIAGNOSIS — R590 Localized enlarged lymph nodes: Secondary | ICD-10-CM

## 2012-11-09 ENCOUNTER — Ambulatory Visit
Admission: RE | Admit: 2012-11-09 | Discharge: 2012-11-09 | Disposition: A | Discharge status: Home / Self Care | Payer: BC Managed Care – PPO | Source: Ambulatory Visit | Attending: Internal Medicine | Admitting: Internal Medicine

## 2012-11-09 DIAGNOSIS — R590 Localized enlarged lymph nodes: Secondary | ICD-10-CM

## 2012-11-09 MED ORDER — IOHEXOL 300 MG/ML  SOLN
75.0000 mL | Freq: Once | INTRAMUSCULAR | Status: AC | PRN
  Administered 2012-11-09: 75 mL via INTRAVENOUS

## 2012-12-06 ENCOUNTER — Other Ambulatory Visit (HOSPITAL_BASED_OUTPATIENT_CLINIC_OR_DEPARTMENT_OTHER): Payer: BC Managed Care – PPO

## 2012-12-06 DIAGNOSIS — C50912 Malignant neoplasm of unspecified site of left female breast: Secondary | ICD-10-CM

## 2012-12-06 DIAGNOSIS — C50919 Malignant neoplasm of unspecified site of unspecified female breast: Secondary | ICD-10-CM

## 2012-12-06 LAB — COMPREHENSIVE METABOLIC PANEL (CC13)
AST: 15 U/L (ref 5–34)
Albumin: 3.6 g/dL (ref 3.5–5.0)
Alkaline Phosphatase: 56 U/L (ref 40–150)
BUN: 11.6 mg/dL (ref 7.0–26.0)
Calcium: 9.2 mg/dL (ref 8.4–10.4)
Creatinine: 0.8 mg/dL (ref 0.6–1.1)
Glucose: 110 mg/dl (ref 70–140)
Potassium: 4.2 mEq/L (ref 3.5–5.1)

## 2012-12-06 LAB — CBC WITH DIFFERENTIAL/PLATELET
Basophils Absolute: 0 10*3/uL (ref 0.0–0.1)
EOS%: 0.6 % (ref 0.0–7.0)
Eosinophils Absolute: 0 10*3/uL (ref 0.0–0.5)
HCT: 37.9 % (ref 34.8–46.6)
HGB: 12.5 g/dL (ref 11.6–15.9)
MCH: 27.3 pg (ref 25.1–34.0)
MCV: 82.8 fL (ref 79.5–101.0)
MONO%: 8 % (ref 0.0–14.0)
NEUT#: 2.2 10*3/uL (ref 1.5–6.5)
NEUT%: 57.5 % (ref 38.4–76.8)
Platelets: 259 10*3/uL (ref 145–400)

## 2012-12-11 ENCOUNTER — Telehealth: Payer: Self-pay | Admitting: Oncology

## 2012-12-11 NOTE — Telephone Encounter (Signed)
returned pt called and advised to let me know when they need to r/s

## 2012-12-12 ENCOUNTER — Telehealth: Payer: Self-pay | Admitting: Oncology

## 2012-12-12 NOTE — Telephone Encounter (Signed)
Returned pt's call and lmonvm for pt re calling back to r/s 8/6 appt. Then called cell and s/w pt. Pt given new appt for 8/7 @ 8:45am.

## 2012-12-13 ENCOUNTER — Ambulatory Visit: Payer: BC Managed Care – PPO | Admitting: Physician Assistant

## 2012-12-13 ENCOUNTER — Other Ambulatory Visit (HOSPITAL_COMMUNITY): Payer: Self-pay | Admitting: *Deleted

## 2012-12-13 MED ORDER — AMLODIPINE-OLMESARTAN 10-40 MG PO TABS: 1.0000 | ORAL_TABLET | Freq: Every day | ORAL | Status: AC

## 2012-12-14 ENCOUNTER — Telehealth: Payer: Self-pay | Admitting: *Deleted

## 2012-12-14 ENCOUNTER — Ambulatory Visit: Payer: BC Managed Care – PPO | Admitting: Physician Assistant

## 2012-12-14 NOTE — Telephone Encounter (Signed)
Pt was here however had her arrival time confused. [facility] was willing to see the pt but pt refused to stay, because it was two pts ahead of her. The patient decided to rs for 01/26/13 @ 11:15am per her request...td

## 2013-01-26 ENCOUNTER — Ambulatory Visit: Payer: BC Managed Care – PPO | Admitting: Physician Assistant

## 2013-01-26 ENCOUNTER — Encounter: Payer: Self-pay | Admitting: Physician Assistant

## 2013-01-26 ENCOUNTER — Telehealth: Payer: Self-pay | Admitting: Oncology

## 2013-01-26 ENCOUNTER — Ambulatory Visit (HOSPITAL_BASED_OUTPATIENT_CLINIC_OR_DEPARTMENT_OTHER): Payer: BC Managed Care – PPO | Admitting: Physician Assistant

## 2013-01-26 VITALS — BP 136/84 | HR 71 | Temp 98.1°F | Resp 18 | Ht 59.0 in | Wt 219.5 lb

## 2013-01-26 DIAGNOSIS — Z853 Personal history of malignant neoplasm of breast: Secondary | ICD-10-CM

## 2013-01-26 DIAGNOSIS — C50919 Malignant neoplasm of unspecified site of unspecified female breast: Secondary | ICD-10-CM

## 2013-01-26 DIAGNOSIS — Z780 Asymptomatic menopausal state: Secondary | ICD-10-CM

## 2013-01-26 DIAGNOSIS — C50912 Malignant neoplasm of unspecified site of left female breast: Secondary | ICD-10-CM

## 2013-01-26 NOTE — Progress Notes (Signed)
ID: [name]   DOB: 15-May-1955  MR#: [id number]  VHQ#:[id number]  PCP: Kari Baars, MD GYN: Reynaldo Minium, MD SU: Ovidio Kin, MD OTHER MD: Chipper Herb, MD   CHIEF COMPLAINT:  Left Breast Cancer   HISTORY OF PRESENT ILLNESS: [name] is a  woman referred by Dr. Clelia Croft for treatment of left breast cancer.   The patient underwent screening bilateral mammography July 04, 2009, at [facility] and this showed only a scattered fibroglandular densities. However, repeat screening exam Sep 29, 2010 showed a potential abnormality in the left breast, retroareolar region. The patient was recalled for additional views Oct 08, 2010 and Dr. Derinda Late was able to locate the nodular density noted on the screening exam. It measured approximately 8 mm. Ultrasound showed a second nodular density just lateral to the nipple areolar complex. In that area, there was what appears to be either a small cluster of cysts or perhaps a solid nodule. In particular, the nodule seen in the 3 o'clock position was felt to be most likely a fibroadenoma. The other area, however, required biopsy and this was performed October 26, 2010. The pathology from this procedure ([id number]) showed a high-grade invasive ductal carcinoma which was estrogen-receptor positive at 98%, progesterone-receptor positive at 13% with an elevated MIB-1 at 69% and no evidence of HER-2 amplification, the ratio by CISH being 1.32.   With this information the patient was referred for bilateral breast MRIs and these were performed October 30, 2010. In the left breast there was a 1.8 cm ill-defined area in the central breast associated with post biopsy changes. There were really no other suspicious areas in either breast and no bony abnormalities and no abnormal appearing or enlarged lymph nodes.   Patient underwent left lumpectomy and sentinel lymph node sampling December 10, 2010 ([id number]) for a 1.1-cm invasive ductal carcinoma, grade 3, with 0/2 sentinel lymph  nodes involved and so T1c N0 (stage I); the tumor being estrogen receptor 98% and progesterone receptor 13% positive, with no HER-2 amplification (by initial determination, but amplified on repeat with a ratio of 2.30); with an Oncotype DX score of 43 predicting a 29% risk of recurrence if she takes 5 years of tamoxifen. (Her-2 was negative on the Oncotype DX determination, which uses a completely different method).   Her subsequent history is as detailed below.   INTERVAL HISTORY: [name] returns today for followup of her left breast carcinoma. She is feeling well and has had a "great summer". She is back to work, and overall is feeling well.  [name] was started on tamoxifen in March of 2013, but is still not taking the drug consistently. She tells me she takes it approximately once every 5 days. When she takes it, she does have increased hot flashes and muscle cramps, but the primary reason for not taking it appears to be concern regarding side effects. She's especially worried about the risk of uterine cancer.  [name]'s last menstrual cycle was approximately May 2012, and she denies any recent abnormal bleeding whatsoever.   REVIEW OF SYSTEMS: [name] denies any recent illnesses and has had no fevers, chills, rashes, bruising, or abnormal bleeding.  Her energy level is good. She is still exercising regularly. She is eating and drinking well, with no nausea and no change in bowel or bladder habits.  No cough, shortness of breath, chest pain or palpitations.  She denies abnormal headaches, dizziness, or change in vision.  She currently has no complaints of myalgias, arthralgias, or bony pain and  has had no peripheral swelling.  Otherwise a detailed review of systems today was noncontributory.  PAST MEDICAL HISTORY: Past Medical History  Diagnosis Date  . Hypertension   . Breast lump     left  . Gum disease   . Cancer   . 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  Diabetes mellitus     borderline  . Sleep apnea     PAST SURGICAL HISTORY: Past Surgical History  Procedure Laterality Date  . Laparoscopic gastric banding    . Foot mass excision    . Soft tissue cyst excision      back cyst  . Vaginal mass excision    . Mass excision      back  . Portacath placement  02/11/11  . Resectoscopic polypectomy  2001    MYOMECTOMY  . Breast lumpectomy  2012    left  . Portacath removal  MARCH 2014    FAMILY HISTORY Family History  Problem Relation Age of Onset  . Cancer Maternal Uncle 70    prostate  . Alcohol abuse Father   . Cirrhosis Father   . Cancer Mother     breast    GYNECOLOGIC HISTORY: ((Updated 01/26/2013) She had menarche at age 8. She is GX P0.  Last menstrual period was late May 2012.  She has never used hormone replacement.    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 She attends a [employer]. She goes to the gym at least three times a week.    ADVANCED DIRECTIVES:  HEALTH MAINTENANCE: History  Substance Use Topics  . Smoking status: Former Smoker -- 1.00 packs/day for 21 years    Types: Cigarettes    Quit date: 03/25/1999  . Smokeless tobacco: Never Used  . Alcohol Use: No     Colonoscopy:   PAP: May 2014, Dr. Lily Peer  Bone density: March 2011, Normal  Lipid panel:  UTD, Dr. Clelia Croft  No Known Allergies  Current Outpatient Prescriptions  Medication Sig Dispense Refill  . amLODipine-olmesartan (AZOR) 10-40 MG per tablet Take 1 tablet by mouth daily.  30 tablet  6  . diclofenac sodium (VOLTAREN) 1 % GEL Apply topically.      . multivitamin-iron-minerals-folic acid (CENTRUM) chewable tablet Chew 1 tablet by mouth daily.        . tamoxifen (NOLVADEX) 20 MG tablet Take 1 tablet (20 mg total) by mouth daily.  30 tablet  5  . albuterol (PROVENTIL HFA;VENTOLIN HFA) 108 (90 BASE) MCG/ACT inhaler Inhale 2 puffs into the lungs every 6 (six) hours as needed  for wheezing.  1 Inhaler  3  . Calcium Citrate-Vitamin D (CALCET CREAMY BITES PO) Take by mouth daily.       . [DISCONTINUED] prochlorperazine (COMPAZINE) 10 MG tablet       . [DISCONTINUED] triamterene-hydrochlorothiazide (DYAZIDE) 37.5-25 MG per capsule Take 1 each (1 capsule total) by mouth every morning.  30 capsule  0   No current facility-administered medications for this visit.   Facility-Administered Medications Ordered in Other Visits  Medication Dose Route Frequency Provider Last Rate Last Dose  . 0.9 %  sodium chloride infusion   Intravenous Once Jiya Kissinger G Kamille Toomey, PA-C      . sodium chloride 0.9 % injection 10 mL  10 mL Intracatheter PRN Shara Hartis Allegra Grana, PA-C   10 mL at 05/06/11 1721    OBJECTIVE: Middle-aged [place] American  female who looks well and is in no acute distress Filed Vitals:   01/26/13 1120  BP: 136/84  Pulse: 71  Temp: 98.1 F (36.7 C)  Resp: 18     Body mass index is 44.31 kg/(m^2).    ECOG FS: 0 Filed Weights   01/26/13 1120  Weight: 219 lb 8 oz (99.565 kg)   Filed Weights   01/26/13 1120  Weight: 219 lb 8 oz (99.565 kg)   Physical Exam: HEENT:  Sclerae anicteric.  PERRLA. Oropharynx clear. Mucosa is pink and moist NODES:  No cervical or supraclavicular lymphadenopathy palpated.  BREAST EXAM: Right breast is unremarkable. Left breast is status post lumpectomy, with no suspicious nodularity or evidence of local recurrence.  Axillae are benign bilaterally with no palpable adenopathy LUNGS:  Clear to auscultation bilaterally.  No wheezes or rhonchi HEART:  Regular rate and rhythm. No murmur  ABDOMEN:  Soft, obese, nontender.  Positive bowel sounds.  MSK:  No focal spinal tenderness to palpation. Full range of motion in the upper extremities EXTREMITIES:  No peripheral edema.   NEURO:  Nonfocal. Well oriented.  Positive affect.    LAB RESULTS: Lab Results  Component Value Date   WBC 3.8* 12/06/2012   NEUTROABS 2.2 12/06/2012   HGB 12.5 12/06/2012   HCT  37.9 12/06/2012   MCV 82.8 12/06/2012   PLT 259 12/06/2012      Chemistry      Component Value Date/Time   NA 143 12/06/2012 0903   NA 140 12/31/2011 1034   K 4.2 12/06/2012 0903   K 3.8 12/31/2011 1034   CL 105 09/08/2012 1320   CL 106 12/31/2011 1034   CO2 26 12/06/2012 0903   CO2 26 12/31/2011 1034   BUN 11.6 12/06/2012 0903   BUN 11 12/31/2011 1034   CREATININE 0.8 12/06/2012 0903   CREATININE 0.74 12/31/2011 1034      Component Value Date/Time   CALCIUM 9.2 12/06/2012 0903   CALCIUM 8.9 12/31/2011 1034   ALKPHOS 56 12/06/2012 0903   ALKPHOS 37* 12/31/2011 1034   AST 15 12/06/2012 0903   AST 15 12/31/2011 1034   ALT 17 12/06/2012 0903   ALT 11 12/31/2011 1034   BILITOT 0.43 12/06/2012 0903   BILITOT 0.3 12/31/2011 1034       STUDIES:  Most recent mammogram at [facility] was on 10/30/2012 and was stable.  Patient has not had a bone density since 2011, at which time it was normal.   Patient also had a CT of the neck on 11/09/2012 to evaluate a painless lump on the right neck. CT was unremarkable with no pathologic findings suspected and no worrisome findings. Specifically, there were no suspicious masses or enlarged lymph nodes.    ASSESSMENT: 57 year old [place] woman   1. Status post left lumpectomy and sentinel lymph node sampling August 2012 for a T1cN0, stage IA invasive ductal carcinoma, grade 3, estrogen receptor 98% and progesterone receptor 13% positive, HER-2/neu amplified with a ratio of 2.3.   2. Oncotype showed a recurrence score of 43 ( "high risk"), predicting a 29% risk of recurrence if the patient's only adjuvant treatment was tamoxifen   3. received adjuvant docetaxel/ cyclophosphamide x4, completed December 2012   4. trastuzumab started 04/15/2011, completed 05/05/2012; most recent echo 02/18/2012  5. radiation therapy, completed July 23, 2011  6. began on tamoxifen March 2013, but admits that she has not always taken it consistently  PLAN: Over half of our 35  minute appointment today  was spent counseling Jamine with regards to her diagnosis and her concerns about taking tamoxifen.  We again reviewed possible side effects of the tamoxifen, including the risk of endometrial cancer and clots. We discussed the fact that, at this time, she is not in a high-risk category for either of these issues.  We also discussed the option of aromatase inhibitors and reviewed those associated side effects as well. We discussed the difference between the 2 types of antiestrogen therapy. She was given all of this information in writing today.  We also reviewed the results of her recent neck CT which was unremarkable. We reviewed the results of her mammogram from June, and the fact that she had a normal bone density in 2011.  Holley again tells me that she "wants to take the tamoxifen", and will try to be more consistent with the medication. I offered to draw labs today to assess for menopausal status, but she "is not in the mood to be stuck today". She would rather return for routine followup in 3 months, and we will obtain hormone studies at that time.  We will also order a repeat bone density to be done in the next couple of months.  When I see Lindsay again in January, we will assess her compliance with the tamoxifen therapy, and will evaluate any associated side effects. We will also have her bone density results to evaluate bone strength,  as well as her lab results to confirm whether or not she is truly postmenopausal. If she is, we can certainly consider switching from tamoxifen to an aromatase inhibitor if she is not tolerating the tamoxifen, or is still uncomfortable with the drug. (I will mention that Erendira tells me her mother is taking her own tamoxifen every day with no problems, and is encouraging Rhealynn to do the same.)    Lotta voiced understanding and agreement with this plan.  She knows to call with any changes or problems prior to her next appointment.   Dominique Ressel     01/26/2013

## 2013-02-19 ENCOUNTER — Ambulatory Visit (INDEPENDENT_AMBULATORY_CARE_PROVIDER_SITE_OTHER): Payer: BC Managed Care – PPO | Admitting: Family Medicine

## 2013-02-19 VITALS — BP 110/70 | HR 87 | Temp 101.0°F | Resp 18 | Ht 60.5 in | Wt 216.0 lb

## 2013-02-19 DIAGNOSIS — L0201 Cutaneous abscess of face: Secondary | ICD-10-CM

## 2013-02-19 LAB — POCT CBC
Granulocyte percent: 66.1 %G (ref 37–80)
Hemoglobin: 12.9 g/dL (ref 12.2–16.2)
MPV: 8.8 fL (ref 0–99.8)
POC Granulocyte: 2.6 (ref 2–6.9)
POC MID %: 6.8 %M (ref 0–12)
RBC: 4.71 M/uL (ref 4.04–5.48)
WBC: 4 10*3/uL — AB (ref 4.6–10.2)

## 2013-02-19 LAB — POCT SEDIMENTATION RATE: POCT SED RATE: 42 mm/hr — AB (ref 0–22)

## 2013-02-19 MED ORDER — DOXYCYCLINE HYCLATE 100 MG PO CAPS: 100.0000 mg | ORAL_CAPSULE | Freq: Two times a day (BID) | ORAL | Status: DC

## 2013-02-19 MED ORDER — RANITIDINE HCL 150 MG PO TABS: 150.0000 mg | ORAL_TABLET | Freq: Two times a day (BID) | ORAL | Status: DC

## 2013-02-19 MED ORDER — CETIRIZINE HCL 10 MG PO TABS: 10.0000 mg | ORAL_TABLET | Freq: Every day | ORAL | Status: DC

## 2013-02-19 MED ORDER — VALACYCLOVIR HCL 1 G PO TABS: 1000.0000 mg | ORAL_TABLET | Freq: Three times a day (TID) | ORAL | Status: DC

## 2013-02-19 NOTE — Patient Instructions (Signed)
Erysipelas  Erysipelas is a sudden form of cellulitis (inflammation of the cells) that affects the tissues near the skin surface. It is most often caused by a streptococcal or staphylococcal (germ) infection.  SYMPTOMS  Erysipelas begins as just not feeling well (malaise), chills, and a fever of usually 101° F (38.3° C) to 104° F (40° C). Being it is an inflammation (soreness) of the skin and the tissue just beneath the skin; it shows up as a reddened area with sharp borders. It may be streaked because the lymphatics are infected. These are lymph channels that flow out of your glands (lymph nodes), like the glands in your neck. The reddened area may be tender to touch with itching and burning of the skin. Sometimes this is accompanied by feelings of nausea (you are sick to your stomach) and vomiting (throwing up). Sometimes there may be a break in the skin over the reddened area which is where the bacteria (germs) entered the body. Sometimes there may not appear to be a site of entry. The most common area for erysipelas to appear is on the lower legs. When the legs are infected, it is usually the glands (lymph nodes) in the groin that may be enlarged and tender.  DIAGNOSIS   Your caregiver most often bases the diagnosis (learning what is wrong) on your physical findings (examination). It is often hard to grow the germs that produce this illness. Sometimes blood cultures (to see what germs may be growing in your blood) will be done if there is a high fever and the blood cultures are likely to be positive. This means the culture is able to grow the bacteria (germ) producing the erysipelas. If blood counts are done, the white blood count is usually elevated. The ESR (erythrocyte sedimentation rate) is also usually elevated (higher than normal). The ESR is just a nonspecific sign of infection being present.  TREATMENT   This infection usually responds rapidly to medications which kill germs (antibiotics). Depending on  findings and course of the illness (gets better or worse), your caregiver will be able to decide which is the best possible treatment for you. Most often these infections respond well to penicillin in individuals not allergic to penicillin. Other alternatives are available for those who cannot take penicillin.  HOME CARE INSTRUCTIONS   · You may return to work as directed.  · Only take over-the-counter or prescription medicines for pain, discomfort, or fever as directed by your caregiver.  · Finish all antibiotics as prescribed by your caregiver even if it looks as if the infection has cleared completely.  SEEK MEDICAL CARE IF:   · Your chills and feelings of illness are getting worse.  · You have pain or discomfort not controlled by medications, or if symptoms seem to be getting worse rather than better.  · The reddened area of infection seems to be spreading rather than getting smaller, red lines are extending away from the infection toward your chest or abdomen, or a part of the infection begins to turn dark in color.  · The problem returns in the same or another area after it seems to have gone away.  MAKE SURE YOU:   · Understand these instructions.  · Will watch your condition.  · Will get help right away if you are not doing well or get worse.  Document Released: 01/19/2001 Document Revised: 07/19/2011 Document Reviewed: 12/13/2007  ExitCare® Patient Information ©2014 [employer].

## 2013-02-19 NOTE — Progress Notes (Addendum)
Subjective:  This chart was scribed for Tammy Sorenson, MD by [name], ED scribe.  This patient was seen in room [facility] Room 14 and the patient's care was started at 12:57 PM.   Patient ID: [name], female    DOB: February 07, 1956, 57 y.o.   MRN: [id number]  Chief Complaint  Patient presents with  . Facial Swelling  . Fever    HPI  HPI Comments: [name] is a 57 y.o. female with h/o DM and HTN who presents with a chief complaint of progressively-worsening facial swelling with associated fever that began yesterday.  Pt states that 3 days ago she put on lip gloss and noted that it was "burning my lips."  The next day she used a Hotel manager" lip stick which she had bought for $1 at a "hair show," and she states "I don't think it was used, but I don't remember opening a seal."  Yesterday her lip became mildly swollen and she thought she may be developing a cold sore.  She also developed a fever of 101 F.  This morning she awoke with her lip much more swollen as well as swelling to her neck.  Swelling has worsened throughout the day.  She states "my neck is tingling like something is crawling on me."  She has taken Advil pta. She denies difficulty swallowing, SOB, difficulty eating or drinking, or drooling.  Pt medicates with Azor for her HTN.  Denies any h/o chicken pox - thinks she did receive the zostavax.   Past Medical History  Diagnosis Date  . Hypertension   . Breast lump     left  . Gum disease   . Cancer   . 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 Diabetes mellitus     borderline  . Sleep apnea       Medication List       This list is accurate as of: 02/19/13 12:57 PM.  Always use your most recent med list.               albuterol 108 (90 BASE) MCG/ACT inhaler  Commonly known as:  PROVENTIL HFA;VENTOLIN HFA  Inhale 2 puffs into the lungs every 6 (six) hours as needed for wheezing.     amLODipine-olmesartan 10-40 MG per tablet   Commonly known as:  AZOR  Take 1 tablet by mouth daily.     CALCET CREAMY BITES PO  Take by mouth daily.     multivitamin-iron-minerals-folic acid chewable tablet  Chew 1 tablet by mouth daily.     tamoxifen 20 MG tablet  Commonly known as:  NOLVADEX  Take 1 tablet (20 mg total) by mouth daily.     VOLTAREN 1 % Gel  Generic drug:  diclofenac sodium  Apply topically.        No Known Allergies   Review of Systems  Constitutional: Positive for fever.  HENT: Positive for facial swelling. Negative for drooling, trouble swallowing and voice change.   Respiratory: Negative for shortness of breath.       BP 110/70  Pulse 87  Temp(Src) 101 F (38.3 C) (Oral)  Resp 18  Ht 5' 0.5" (1.537 m)  Wt 216 lb (97.977 kg)  BMI 41.47 kg/m2  SpO2 98%  LMP 10/06/2010 Objective:   Physical Exam  Nursing note and vitals reviewed. Constitutional: She is oriented to person, place, and time. She appears well-developed and well-nourished. No distress.  HENT:  Head: Normocephalic and atraumatic.  Right Ear: Tympanic membrane and ear canal normal.  Left Ear: Tympanic membrane and ear canal normal.  Mouth/Throat: Uvula is midline, oropharynx is clear and moist and mucous membranes are normal. No trismus in the jaw. No uvula swelling. No oropharyngeal exudate, posterior oropharyngeal edema or posterior oropharyngeal erythema.  Left upper lip with marked angioedema.  No erythema or fluctuance seen.  Eyes: EOM are normal.  Neck: Neck supple. No tracheal deviation present.  Cardiovascular: Normal rate.   Pulmonary/Chest: Effort normal. No respiratory distress.  Musculoskeletal: Normal range of motion.  Lymphadenopathy:       Head (right side): Tonsillar adenopathy present.       Head (left side): Submandibular (very swollen) and tonsillar adenopathy present.    She has cervical adenopathy.       Right cervical: No superficial cervical and no posterior cervical adenopathy present.      Left  cervical: Superficial cervical adenopathy present. No posterior cervical adenopathy present.       Right: No supraclavicular adenopathy present.       Left: No supraclavicular adenopathy present.  Neurological: She is alert and oriented to person, place, and time.  Skin: Skin is warm and dry.  Psychiatric: She has a normal mood and affect. Her behavior is normal.      Results for orders placed in visit on 02/19/13  POCT CBC      Result Value Range   WBC 4.0 (*) 4.6 - 10.2 K/uL   Lymph, poc 1.1  0.6 - 3.4   POC LYMPH PERCENT 27.1  10 - 50 %L   MID (cbc) 0.3  0 - 0.9   POC MID % 6.8  0 - 12 %M   POC Granulocyte 2.6  2 - 6.9   Granulocyte percent 66.1  37 - 80 %G   RBC 4.71  4.04 - 5.48 M/uL   Hemoglobin 12.9  12.2 - 16.2 g/dL   HCT, POC 16.1  09.6 - 47.9 %   MCV 86.6  80 - 97 fL   MCH, POC 27.4  27 - 31.2 pg   MCHC 31.6 (*) 31.8 - 35.4 g/dL   RDW, POC 04.5     Platelet Count, POC 246  142 - 424 K/uL   MPV 8.8  0 - 99.8 fL  POCT SEDIMENTATION RATE      Result Value Range   POCT SED RATE 42 (*) 0 - 22 mm/hr   Assessment & Plan:  Cellulitis and abscess of face - Plan: POCT CBC, POCT SEDIMENTATION RATE I suspect this is more of an infectious etiology due to documented fevers and severe adenopathy on exam so will start doxycycline.  However, the amount of lip swelling looks like that typically seen with angioedema and there is no erythema or warmth or leukocytosis as would normally be seen with erysipelas - therefore, will start zyrtec and zantac in case of allergic etiology - if worsening at all, stop Azor (olmesartan).  Also cannot exclude herpes zoster as pt does describe burning and tingling and seems to developing a superficial ulceration on her lip so will also start pt on valtrex.  Recheck in 2d - sooner if worsening. Consider CT imaging if progresses. Meds ordered this encounter  Medications  . DISCONTD: doxycycline (VIBRAMYCIN) 100 MG capsule    Sig: Take 1 capsule (100 mg  total) by mouth 2 (two) times daily.    Dispense:  20 capsule    Refill:  0  . DISCONTD: valACYclovir (VALTREX) 1000 MG tablet    Sig: Take 1 tablet (1,000 mg total) by mouth 3 (three) times daily.    Dispense:  21 tablet    Refill:  0  . DISCONTD: cetirizine (ZYRTEC) 10 MG tablet    Sig: Take 1 tablet (10 mg total) by mouth daily.    Dispense:  30 tablet    Refill:  1  . DISCONTD: ranitidine (ZANTAC) 150 MG tablet    Sig: Take 1 tablet (150 mg total) by mouth 2 (two) times daily.    Dispense:  60 tablet    Refill:  0  . valACYclovir (VALTREX) 1000 MG tablet    Sig: Take 1 tablet (1,000 mg total) by mouth 3 (three) times daily.    Dispense:  21 tablet    Refill:  0  . cetirizine (ZYRTEC) 10 MG tablet    Sig: Take 1 tablet (10 mg total) by mouth daily.    Dispense:  30 tablet    Refill:  1  . ranitidine (ZANTAC) 150 MG tablet    Sig: Take 1 tablet (150 mg total) by mouth 2 (two) times daily.    Dispense:  60 tablet    Refill:  0  . doxycycline (VIBRAMYCIN) 100 MG capsule    Sig: Take 1 capsule (100 mg total) by mouth 2 (two) times daily.    Dispense:  20 capsule    Refill:  0

## 2013-02-21 ENCOUNTER — Ambulatory Visit (INDEPENDENT_AMBULATORY_CARE_PROVIDER_SITE_OTHER): Payer: BC Managed Care – PPO | Admitting: Family Medicine

## 2013-02-21 VITALS — BP 110/70 | HR 73 | Temp 98.8°F | Resp 16 | Ht 60.5 in | Wt 216.0 lb

## 2013-02-21 DIAGNOSIS — B009 Herpesviral infection, unspecified: Secondary | ICD-10-CM

## 2013-02-21 NOTE — Progress Notes (Signed)
Patient ID: [name], female   DOB: [birth year], 56 y.o.   MRN: [id number]  @[email or web address] @  Patient ID: [name] MRN: [id number], DOB: May 23, 1955, 57 y.o. Date of Encounter: 02/21/2013, 12:49 PM This chart was scribed for Elvina Sidle, MD by Valera Castle, ED Scribe. This patient was seen in room 2 and the patient's care was started at 12:49 PM.   Primary Physician: Kari Baars, MD  Chief Complaint: Follow Up  HPI: 57 y.o. year old female with history below presents for a follow up. She reports having a recent fever with a max temperature of 101, taken yesterday. She reports an associated swollen blister on her lip. She reports being seen here yesterday and they took her blood. She states they told her to come back for a f/u. She reports she has only had one cold sore her whole life. She states that the lip swelling might be due to her current lip gloss.    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 Authorizing Provider  albuterol (PROVENTIL HFA;VENTOLIN HFA) 108 (90 BASE) MCG/ACT inhaler Inhale 2 puffs into the lungs every 6 (six) hours as needed for wheezing. 06/20/12  Yes Jonita Albee, MD  amLODipine-olmesartan (AZOR) 10-40 MG per tablet Take 1 tablet by mouth daily. 12/13/12  Yes Dolores Patty, MD  Calcium Citrate-Vitamin D (CALCET CREAMY BITES PO) Take by mouth daily.    Yes Historical Provider, MD  cetirizine (ZYRTEC) 10 MG tablet Take 1 tablet (10 mg total) by mouth daily. 02/19/13  Yes Sherren Mocha, MD  diclofenac sodium (VOLTAREN) 1 % GEL Apply topically.   Yes Historical Provider, MD  doxycycline (VIBRAMYCIN) 100 MG capsule Take 1 capsule (100 mg total) by mouth 2 (two) times daily.  02/19/13  Yes Sherren Mocha, MD  multivitamin-iron-minerals-folic acid (CENTRUM) chewable tablet Chew 1 tablet by mouth daily.     Yes Historical Provider, MD  ranitidine (ZANTAC) 150 MG tablet Take 1 tablet (150 mg total) by mouth 2 (two) times daily. 02/19/13  Yes Sherren Mocha, MD  tamoxifen (NOLVADEX) 20 MG tablet Take 1 tablet (20 mg total) by mouth daily. 09/08/12  Yes Amy Allegra Grana, PA-C  valACYclovir (VALTREX) 1000 MG tablet Take 1 tablet (1,000 mg total) by mouth 3 (three) times daily. 02/19/13  Yes Sherren Mocha, MD    Allergies: No Known Allergies  History   Social History   Marital Status: Divorced    Spouse Name: N/A    Number of Children: N/A   Years of Education: N/A   Occupational History   Not on file.   Social History Main Topics   Smoking status: Former Smoker -- 1.00 packs/day for 21 years    Types: Cigarettes    Quit date: 03/25/1999   Smokeless tobacco: Never Used   Alcohol Use: No   Drug Use: No   Sexual Activity: Yes    Birth Control/ Protection: Condom   Other Topics Concern   Not on file   Social History Narrative   No narrative on file     Review  of Systems: Constitutional: negative for chills, night sweats, weight changes, or fatigue Positive for fever HEENT: negative for vision changes, hearing loss, congestion, rhinorrhea, ST, epistaxis, or sinus pressure Positive for lip sore, with swelling Cardiovascular: negative for chest pain or palpitations Respiratory: negative for hemoptysis, wheezing, shortness of breath, or cough Abdominal: negative for abdominal pain, nausea, vomiting, diarrhea, or constipation Dermatological: negative for rash Neurologic: negative for headache, dizziness, or syncope All other systems reviewed and are otherwise negative with the exception to those above and in the HPI.   Physical Exam: Blood pressure 110/70, pulse 73, temperature 98.8 F (37.1 C), temperature source Oral, resp. rate 16, height 5' 0.5" (1.537 m),  weight 216 lb (97.977 kg), last menstrual period 10/06/2010, SpO2 98.00%., Body mass index is 41.47 kg/(m^2). General: Well developed, well nourished, in no acute distress. Head: Normocephalic, atraumatic, eyes without discharge, sclera non-icteric, nares are without discharge. Bilateral auditory canals clear, TM's are without perforation, pearly grey and translucent with reflective cone of light bilaterally. Oral cavity moist, posterior pharynx without exudate, erythema, peritonsillar abscess, or post nasal drip.  Neck: Supple. No thyromegaly. Full ROM. Left sided cervical adenopathy Lungs: Clear bilaterally to auscultation without wheezes, rales, or rhonchi. Breathing is unlabored. Heart: RRR with S1 S2. No murmurs, rubs, or gallops appreciated. Abdomen: Soft, non-tender, non-distended with normoactive bowel sounds. No hepatomegaly. No rebound/guarding. No obvious abdominal masses. Msk:  Strength and tone normal for age. Extremities/Skin: Warm and dry. No clubbing or cyanosis. No edema. Swollen left upper lip with scab at vermillion border. Neuro: Alert and oriented X 3. Moves all extremities spontaneously. Gait is normal. CNII-XII grossly in tact. Psych:  Responds to questions appropriately with a normal affect.   Labs: Results for orders placed in visit on 02/19/13  POCT CBC      Result Value Range   WBC 4.0 (*) 4.6 - 10.2 K/uL   Lymph, poc 1.1  0.6 - 3.4   POC LYMPH PERCENT 27.1  10 - 50 %L   MID (cbc) 0.3  0 - 0.9   POC MID % 6.8  0 - 12 %M   POC Granulocyte 2.6  2 - 6.9   Granulocyte percent 66.1  37 - 80 %G   RBC 4.71  4.04 - 5.48 M/uL   Hemoglobin 12.9  12.2 - 16.2 g/dL   HCT, POC 16.1  09.6 - 47.9 %   MCV 86.6  80 - 97 fL   MCH, POC 27.4  27 - 31.2 pg   MCHC 31.6 (*) 31.8 - 35.4 g/dL   RDW, POC 04.5     Platelet Count, POC 246  142 - 424 K/uL   MPV 8.8  0 - 99.8 fL  POCT SEDIMENTATION RATE      Result Value Range   POCT SED RATE 42 (*) 0 - 22 mm/hr    ASSESSMENT AND  PLAN:  57 y.o. 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  She can stop the doxycycline and all but the valtrex.  Signed, Elvina Sidle, MD 02/21/2013 12:49 PM

## 2013-02-21 NOTE — Patient Instructions (Signed)
Cold Sore  A cold sore (fever blister) is a skin infection caused by the herpes simplex virus (HSV-1). HSV-1 is closely related to the virus that causes gential herpes (HSV-2), but they are not the same even though both viruses can cause oral and genital infections. Cold sores are small, fluid-filled sores inside of the mouth or on the lips, gums, nose, chin, cheeks, or fingers.   The herpes simplex virus can be easily passed (contagious) to other people through close personal contact, such as kissing or sharing personal items. The virus can also spread to other parts of the body, such as the eyes or genitals. Cold sores are contagious until the sores crust over completely. They often heal within 2 weeks.   Once a person is infected, the herpes simplex virus remains permanently in the body. Therefore, there is no cure for cold sores, and they often recur when a person is tired, stressed, sick, or gets too much sun. Additional factors that can cause a recurrence include hormone changes in menstruation or pregnancy, certain drugs, and cold weather.   CAUSES   Cold sores are caused by the herpes simplex virus. The virus is spread from person to person through close contact, such as through kissing, touching the affected area, or sharing personal items such as lip balm, razors, or eating utensils.   SYMPTOMS   The first infection may not cause symptoms. If symptoms develop, the symptoms often go through different stages. Here is how a cold sore develops:   · Tingling, itching, or burning is felt 1 2 days before the outbreak.    · Fluid-filled blisters appear on the lips, inside the mouth, nose, or on the cheeks.    · The blisters start to ooze clear fluid.    · The blisters dry up and a yellow crust appears in its place.    · The crust falls off.    Symptoms depend on whether it is the initial outbreak or a recurrence. Some other symptoms with the first outbreak may include:   · Fever.    · Sore throat.    · Headache.     · Muscle aches.    · Swollen neck glands.    DIAGNOSIS   A diagnosis is often made based on your symptoms and looking at the sores. Sometimes, a sore may be swabbed and then examined in the lab to make a final diagnosis. If the sores are not present, blood tests can find the herpes simplex virus.   TREATMENT   There is no cure for cold sores and no vaccine for the herpes simplex virus. Within 2 weeks, most cold sores go away on their own without treatment. Medicines cannot make the infection go away, but medicine can help relieve some of the pain associated with the sores, can work to stop the virus from multiplying, and can also shorten healing time. Medicine may be in the form of creams, gels, pills, or a shot.   HOME CARE INSTRUCTIONS   · Only take over-the-counter or prescription medicines for pain, discomfort, or fever as directed by your caregiver. Do not use aspirin.    · Use a cotton-tip swab to apply creams or gels to your sores.    · Do not touch the sores or pick the scabs. Wash your hands often. Do not touch your eyes without washing your hands first.    · Avoid kissing, oral sex, and sharing personal items until sores heal.    · Apply an ice pack on your sores for 10 15 minutes to ease any   discomfort.    · Avoid hot, cold, or salty foods because they may hurt your mouth. Eat a soft, bland diet to avoid irritating the sores. Use a straw to drink if you have pain when drinking out of a glass.    · Keep sores clean and dry to prevent an infection of other tissues.    · Avoid the sun and limit stress if these things trigger outbreaks. If sun causes cold sores, apply sunscreen on the lips before being out in the sun.    SEEK MEDICAL CARE IF:   · You have a fever or persistent symptoms for more than 2 3 days.    · You have a fever and your symptoms suddenly get worse.    · You have pus, not clear fluid, coming from the sores.    · You have redness that is spreading.    · You have pain or irritation in your  eye.    · You get sores on your genitals.    · Your sores do not heal within 2 weeks.    · You have a weakened immune system.    · You have frequent recurrences of cold sores.    MAKE SURE YOU:   · Understand these instructions.  · Will watch your condition.  · Will get help right away if you are not doing well or get worse.  Document Released: 04/23/2000 Document Revised: 01/19/2012 Document Reviewed: 09/08/2011  Exit[employer]® Patient Information ©2014 [employer].

## 2013-02-27 ENCOUNTER — Telehealth: Payer: Self-pay | Admitting: *Deleted

## 2013-02-27 NOTE — Telephone Encounter (Signed)
Patient calling for results of Bone Density from 02/09/13. Results normal with suggestion of continuing adequate calcium and vitamin D and weight bearing exercise. Left detailed message on identified voicemail for Mychael.

## 2013-03-05 LAB — VIRAL CULTURE VIRC: Organism ID, Bacteria: NEGATIVE

## 2013-04-27 ENCOUNTER — Other Ambulatory Visit: Payer: BC Managed Care – PPO

## 2013-04-27 ENCOUNTER — Telehealth: Payer: Self-pay | Admitting: *Deleted

## 2013-04-27 NOTE — Telephone Encounter (Signed)
Pt called to cancel her appt and will call back at a later time to rs...td

## 2013-05-18 ENCOUNTER — Ambulatory Visit: Payer: BC Managed Care – PPO | Admitting: Physician Assistant

## 2013-08-01 ENCOUNTER — Telehealth: Payer: Self-pay | Admitting: Oncology

## 2013-08-01 NOTE — Telephone Encounter (Signed)
Faxed pt medical records to [facility] °

## 2013-09-18 ENCOUNTER — Other Ambulatory Visit (HOSPITAL_COMMUNITY): Payer: Self-pay | Admitting: Internal Medicine

## 2013-09-21 ENCOUNTER — Other Ambulatory Visit: Payer: Self-pay | Admitting: Physician Assistant

## 2013-09-21 DIAGNOSIS — C50919 Malignant neoplasm of unspecified site of unspecified female breast: Secondary | ICD-10-CM

## 2013-11-29 ENCOUNTER — Encounter: Payer: Self-pay | Admitting: Family Medicine

## 2013-12-12 ENCOUNTER — Encounter: Payer: Self-pay | Admitting: Gynecology

## 2014-04-27 ENCOUNTER — Other Ambulatory Visit: Payer: Self-pay | Admitting: Nurse Practitioner

## 2014-12-16 ENCOUNTER — Encounter: Payer: Self-pay | Admitting: Gynecology

## 2015-03-07 ENCOUNTER — Ambulatory Visit (INDEPENDENT_AMBULATORY_CARE_PROVIDER_SITE_OTHER): Payer: BC Managed Care – PPO | Admitting: Physician Assistant

## 2015-03-07 VITALS — BP 118/70 | HR 74 | Temp 98.2°F | Resp 16 | Ht 60.0 in | Wt 218.0 lb

## 2015-03-07 DIAGNOSIS — S90822A Blister (nonthermal), left foot, initial encounter: Secondary | ICD-10-CM

## 2015-03-07 DIAGNOSIS — R21 Rash and other nonspecific skin eruption: Secondary | ICD-10-CM

## 2015-03-07 MED ORDER — DOXYCYCLINE HYCLATE 100 MG PO CAPS: 100.0000 mg | ORAL_CAPSULE | Freq: Two times a day (BID) | ORAL | Status: DC

## 2015-03-07 NOTE — Patient Instructions (Signed)
Please take the doxycycline twice daily for 10 days.  Keep the area covered when you're active but leave it open when home. If you notice the redness expanding come back to see [place]. If you start having fevers or chills come back to see [place].

## 2015-03-07 NOTE — Progress Notes (Signed)
   Subjective:    Patient ID: [name], female    DOB: March 17, 1956, 59 y.o.   MRN: [id number]  Chief Complaint  Patient presents with  . Insect Bite    Possible spider bite, left foot, some pain, no c/o of numbness or tingling x 2 days   Medications, allergies, past medical history, surgical history, family history, social history and problem list reviewed and updated.  HPI  12 yof presents with rash left foot.   Symptoms started 3 days ago with irritation in between 1st and 2nd toe left foot. Thought she had a pebble in her shoe. Area has continued to be irritated since onset. Not painful but area is tender. Denies itching. She went to the pharmacy to find a topical to apply and pharmacist told her it could be a spider bite and to get checked.   Denies fevers, chills. Denies history of abscesses.   Review of Systems See HPI.     Objective:   Physical Exam  Constitutional: She is oriented to person, place, and time. She appears well-developed and well-nourished.  Non-toxic appearance. She does not have a sickly appearance. She does not appear ill. No distress.  BP 118/70 mmHg  Pulse 74  Temp(Src) 98.2 F (36.8 C) (Oral)  Resp 16  Ht 5' (1.524 m)  Wt 218 lb (98.884 kg)  BMI 42.58 kg/m2  SpO2 98%  LMP 10/06/2010   Musculoskeletal:       Feet:  Small blister over circled area between 1st, 2nd toe left foot. Able to express serous fluid with pressure. Erythema extending up foot approximately 1 cm up dorsum of foot.   Normal pulses. Normal sensation. Normal cap refill.   Neurological: She is alert and oriented to person, place, and time.  Psychiatric: She has a normal mood and affect. Her speech is normal and behavior is normal.      Assessment & Plan:   Rash and nonspecific skin eruption - Plan: Wound culture, doxycycline (VIBRAMYCIN) 100 MG capsule  Blister of foot, left, initial encounter --blister with surrounding erythema --unsure of etiology but appears to be  mild infection, will treat with doxy for possible mrsa coverage --wound culture obtained --rtc with fevers, chills, expanding erythema  Julieta Gutting, PA-C Physician Assistant-Certified [facility]  03/07/2015 9:09 AM

## 2015-03-09 LAB — WOUND CULTURE
GRAM STAIN: NONE SEEN
Gram Stain: NONE SEEN
Organism ID, Bacteria: NO GROWTH

## 2015-03-11 ENCOUNTER — Telehealth: Payer: Self-pay

## 2015-03-11 NOTE — Telephone Encounter (Signed)
Please Advised  Pt. Called in said the antibiotic Todd placed her on, is causing her to feel a heaviness in her chest and she wants to be placed on something else. Also she would like the results of her wound culture.

## 2015-03-11 NOTE — Telephone Encounter (Signed)
Wound culture was negative for bacterial growth which means patient may actually not need an antibiotic. Tell her to stop taking doxycycline due to the side effects she is having. If she is not having improvement of her symptoms over her foot in 2-3 days, please have her call us back, send message to [facility] to try Septra.

## 2015-03-12 NOTE — Telephone Encounter (Signed)
Left message for pt to call back  °

## 2015-03-13 NOTE — Telephone Encounter (Signed)
LMOM to cb

## 2015-03-14 ENCOUNTER — Telehealth: Payer: Self-pay

## 2015-03-14 NOTE — Telephone Encounter (Signed)
Please advise on cx results.

## 2015-03-14 NOTE — Telephone Encounter (Signed)
Patient is calling to follow up about a wound culture and states that she can't take the medicine because it makes her feel sick. I let the patient know that we called her twice and left 2 VMs. She states that she never got them. Now she is expecting someone to call her back today.   [phone]

## 2015-03-14 NOTE — Telephone Encounter (Signed)
Please have her stop the Doxy at this time if she has not already done so because the culture was negative.  If she is still having problems she should RTC.

## 2015-03-14 NOTE — Telephone Encounter (Signed)
Spoke with pt, advised message from Mani. Pt understood. 

## 2015-08-11 ENCOUNTER — Other Ambulatory Visit (HOSPITAL_COMMUNITY): Payer: Self-pay | Admitting: Sports Medicine

## 2015-08-11 ENCOUNTER — Ambulatory Visit (HOSPITAL_COMMUNITY)
Admission: RE | Admit: 2015-08-11 | Discharge: 2015-08-11 | Disposition: A | Discharge status: Home / Self Care | Payer: BC Managed Care – PPO | Source: Ambulatory Visit | Attending: Sports Medicine | Admitting: Sports Medicine

## 2015-08-11 DIAGNOSIS — M79604 Pain in right leg: Secondary | ICD-10-CM

## 2015-08-11 DIAGNOSIS — I10 Essential (primary) hypertension: Secondary | ICD-10-CM | POA: Diagnosis not present

## 2015-08-11 DIAGNOSIS — E119 Type 2 diabetes mellitus without complications: Secondary | ICD-10-CM | POA: Diagnosis not present

## 2015-08-11 DIAGNOSIS — G4730 Sleep apnea, unspecified: Secondary | ICD-10-CM | POA: Insufficient documentation

## 2015-08-11 DIAGNOSIS — M7989 Other specified soft tissue disorders: Secondary | ICD-10-CM

## 2016-01-02 ENCOUNTER — Telehealth: Payer: Self-pay | Admitting: Oncology

## 2016-01-02 NOTE — Telephone Encounter (Signed)
Spoke with pt to confirm 10/2 appt at 130 am per LOS

## 2016-02-06 ENCOUNTER — Encounter: Payer: Self-pay | Admitting: Gynecology

## 2016-02-06 ENCOUNTER — Ambulatory Visit (INDEPENDENT_AMBULATORY_CARE_PROVIDER_SITE_OTHER): Payer: BC Managed Care – PPO | Admitting: Gynecology

## 2016-02-06 ENCOUNTER — Other Ambulatory Visit: Payer: Self-pay | Admitting: Gynecology

## 2016-02-06 VITALS — BP 128/80 | Ht 60.0 in | Wt 222.0 lb

## 2016-02-06 DIAGNOSIS — Z01419 Encounter for gynecological examination (general) (routine) without abnormal findings: Secondary | ICD-10-CM | POA: Diagnosis not present

## 2016-02-06 DIAGNOSIS — Z780 Asymptomatic menopausal state: Secondary | ICD-10-CM

## 2016-02-06 DIAGNOSIS — Z853 Personal history of malignant neoplasm of breast: Secondary | ICD-10-CM

## 2016-02-06 DIAGNOSIS — Z1159 Encounter for screening for other viral diseases: Secondary | ICD-10-CM

## 2016-02-06 NOTE — Progress Notes (Signed)
[name] Nov 01, 1955 [id number]   History:   60 year old patient presented to the office for her annual exam. She has not been seen the office in 3 years. Patient with personal history of breast cancer as follows:  07/04/2009 screening bilateral mammogram demonstrated scattered fibroglandular densities. Repeat screening May 2012 questionable abnormality in the left breast retroareolar region. Ultrasound is shown nodular densities just lateral to the nipple areolar complex there was questionable cluster of cysts or perhaps a solid nodule. Pathology report from that  Biopsy on 10/26/2010 demonstrated high-grade invasive ductal carcinoma which was estrogen and progesterone receptor positive. Patient underwent left lumpectomy and sentinel node sampling on 12/10/2010 an invasive ductal carcinoma, grade 3, with 0/2 sentinel nodes were involved and was given a stage :T1c N0 (stage I) patient received  adjuvant chemotherapy consisting of 4 cycles of q. three-week docetaxel and cyclophosphamide given along with trastuzumab, to be followed by radiation and antiestrogen therapy.The trastuzumab  was continued for one year and has completed her tamoxifen treatment. She scheduled to see Dr. Jana Hakim her medical oncologist next week. Patient had a normal colonoscopy in her 38s she is on a 10 year recall. Her 3-D mammogram in August 2016 was normal. Her bone density study in 2014 was also normal. Patient with no previous history of any abnormal Pap smear. Of note patient's mother also had breast cancer at the age of 4.  Patient was weighing 216 pounds last year's up to 222 with a BMI of 43.36 kg/m   Past medical history,surgical history, family history and social history were all reviewed and documented in the EPIC chart.  Gynecologic History Patient's last menstrual period was 10/06/2010. Contraception: post menopausal status Last Pap: 2014. Results were: normal Last mammogram: 2016. Results were:  Normal 3-D mammogram  Obstetric History OB History  Gravida Para Term Preterm AB Living  1 0     1 0  SAB TAB Ectopic Multiple Live Births  1            # Outcome Date GA Lbr Len/2nd Weight Sex Delivery Anes PTL Lv  1 SAB                ROS: A ROS was performed and pertinent positives and negatives are included in the history.  GENERAL: No fevers or chills. HEENT: No change in vision, no earache, sore throat or sinus congestion. NECK: No pain or stiffness. CARDIOVASCULAR: No chest pain or pressure. No palpitations. PULMONARY: No shortness of breath, cough or wheeze. GASTROINTESTINAL: No abdominal pain, nausea, vomiting or diarrhea, melena or bright red blood per rectum. GENITOURINARY: No urinary frequency, urgency, hesitancy or dysuria. MUSCULOSKELETAL: No joint or muscle pain, no back pain, no recent trauma. DERMATOLOGIC: No rash, no itching, no lesions. ENDOCRINE: No polyuria, polydipsia, no heat or cold intolerance. No recent change in weight. HEMATOLOGICAL: No anemia or easy bruising or bleeding. NEUROLOGIC: No headache, seizures, numbness, tingling or weakness. PSYCHIATRIC: No depression, no loss of interest in normal activity or change in sleep pattern.     Exam: chaperone present  BP 128/80   Ht 5' (1.524 m)   Wt 222 lb (100.7 kg)   LMP 10/06/2010   BMI 43.36 kg/m   Body mass index is 43.36 kg/m.  General appearance : Well developed well nourished female. No acute distress HEENT: Eyes: no retinal hemorrhage or exudates,  Neck supple, trachea midline, no carotid bruits, no thyroidmegaly Lungs: Clear to auscultation, no rhonchi or wheezes, or rib retractions  Heart: Regular rate and rhythm, no murmurs or gallops Breast:Examined in sitting and supine position were symmetrical in appearance, no palpable masses or tenderness,  no skin retraction, no nipple inversion, no nipple discharge, no skin discoloration, no axillary or supraclavicular lymphadenopathy Abdomen: no palpable  masses or tenderness, no rebound or guarding Extremities: no edema or skin discoloration or tenderness  Pelvic:  Bartholin, Urethra, Skene Glands: Within normal limits             Vagina: No gross lesions or discharge, narrow  Cervix: No gross lesions or discharge  Uterus  limited due to abdominal girth  Adnexa  limited due to abdominal girth  Anus and perineum  normal   Rectovaginal  normal sphincter tone without palpated masses or tenderness             Hemoccult PCP provides her with her fecal Hemoccult cards     Assessment/Plan:  60 y.o. 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 Patient was schedule an ultrasound to better assess her uterus and ovaries and she is overweight and based on her abdominal girth and slight vaginism mated difficult to get a full assessment of her female reproductive organs. Patient is due for bone density study and mammogram in November of this year. Pap smear with HPV was done today. We discussed importance of calcium vitamin D and weightbearing exercises for osteoporosis prevention. Patient declined flu vaccine.  New CDC guidelines is recommending patients be tested once in her lifetime for hepatitis C antibody who were born between [birth year] through 1964. This was discussed with the patient today and has agreed to be tested today.   Terrance Mass MD, 12:50 PM 02/06/2016

## 2016-02-06 NOTE — Addendum Note (Signed)
Addended by: Burnett Kanaris on: 02/06/2016 02:42 PM   Modules accepted: Orders

## 2016-02-06 NOTE — Addendum Note (Signed)
Addended by: Joaquin Music on: 02/06/2016 01:00 PM   Modules accepted: Orders

## 2016-02-09 ENCOUNTER — Encounter: Payer: Self-pay | Admitting: Oncology

## 2016-02-09 ENCOUNTER — Ambulatory Visit (HOSPITAL_BASED_OUTPATIENT_CLINIC_OR_DEPARTMENT_OTHER): Payer: BC Managed Care – PPO | Admitting: Oncology

## 2016-02-09 DIAGNOSIS — C50812 Malignant neoplasm of overlapping sites of left female breast: Secondary | ICD-10-CM | POA: Insufficient documentation

## 2016-02-09 DIAGNOSIS — Z170 Estrogen receptor positive status [ER+]: Secondary | ICD-10-CM | POA: Diagnosis not present

## 2016-02-09 LAB — PAP IG W/ RFLX HPV ASCU

## 2016-02-09 NOTE — Progress Notes (Signed)
ID: [name]   DOB: 1955-06-11  MR#: [id number]  VOJ#:[id number]  PCP: Marton Redwood, MD GYN: [name], MD SU: Alphonsa Overall, MD OTHER MD: Arloa Koh, MD   CHIEF COMPLAINT:  Left Breast Cancer  CURRENT TREATMENT: Observation   HISTORY OF PRESENT ILLNESS: [name] is a  woman referred by [name] for treatment of left breast cancer.   The patient underwent screening bilateral mammography July 04, 2009, at [facility] and this showed only a scattered fibroglandular densities. However, repeat screening exam Sep 29, 2010 showed a potential abnormality in the left breast, retroareolar region. The patient was recalled for additional views Oct 08, 2010 and Dr. [name] was able to locate the nodular density noted on the screening exam. It measured approximately 8 mm. Ultrasound showed a second nodular density just lateral to the nipple areolar complex. In that area, there was what appears to be either a small cluster of cysts or perhaps a solid nodule. In particular, the nodule seen in the 3 o'clock position was felt to be most likely a fibroadenoma. The other area, however, required biopsy and this was performed October 26, 2010. The pathology from this procedure ([id number]) showed a high-grade invasive ductal carcinoma which was estrogen-receptor positive at 98%, progesterone-receptor positive at 13% with an elevated MIB-1 at 69% and no evidence of HER-2 amplification, the ratio by CISH being 1.32.   With this information the patient was referred for bilateral breast MRIs and these were performed October 30, 2010. In the left breast there was a 1.8 cm ill-defined area in the central breast associated with post biopsy changes. There were really no other suspicious areas in either breast and no bony abnormalities and no abnormal appearing or enlarged lymph nodes.   Patient underwent left lumpectomy and sentinel lymph node sampling December 10, 2010 ([id number]) for a 1.1-cm invasive ductal carcinoma,  grade 3, with 0/2 sentinel lymph nodes involved and so T1c N0 (stage I); the tumor being estrogen receptor 98% and progesterone receptor 13% positive, with no HER-2 amplification (by initial determination, but amplified on repeat with a ratio of 2.30); with an Oncotype DX score of 43 predicting a 29% risk of recurrence if she takes 5 years of tamoxifen. (Her-2 was negative on the Oncotype DX determination, which uses a completely different method).   Her subsequent history is as detailed below.   INTERVAL HISTORY: [name] returns today for followup of her triple positive breast carcinoma. She "is happy" and doing well. She goes to the gym 3 times a week and doing cardio primarily. She reminded me that her mother, [name], is now also coming here for breast cancer treatment.  REVIEW OF SYSTEMS: [name] complains of foot pain and of course she is standing all day at work. She tells me she tries to get very comfortable shoes. She has some tenderness in the left breast particularly when examined, and sometimes in the right as well associated with a port site. Aside from those issues a detailed review of systems today was benign  PAST MEDICAL HISTORY: Past Medical History:  Diagnosis Date  . Breast cancer ([place])    stage I high grade invasive ductal ca left breast  . Breast lump    left  . Cancer ([place])   . Diabetes mellitus    borderline  . Gum disease   . Hypertension   . Sleep apnea     PAST SURGICAL HISTORY: Past Surgical History:  Procedure Laterality Date  . BREAST LUMPECTOMY  2012   left  . FOOT MASS EXCISION    . LAPAROSCOPIC GASTRIC BANDING    . MASS EXCISION     back  . PORTACATH PLACEMENT  02/11/11  . PORTACATH REMOVAL  MARCH 2014  . RESECTOSCOPIC POLYPECTOMY  2001   MYOMECTOMY  . SOFT TISSUE CYST EXCISION     back cyst  . VAGINAL MASS EXCISION      FAMILY HISTORY Family History  Problem Relation Age of Onset  . Cancer Maternal Uncle 70    prostate  . Alcohol  abuse Father   . Cirrhosis Father   . Cancer Mother     breast    GYNECOLOGIC HISTORY: ((Updated 01/26/2013) She had menarche at age 60. She is GX P0.  Last menstrual period was late May 2012.  She has never used hormone replacement.    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 She attends a [employer]. She goes to the gym at least three times a week.    ADVANCED DIRECTIVES:Not in place  HEALTH MAINTENANCE: Social History  Substance Use Topics  . Smoking status: Former Smoker    Packs/day: 1.00    Years: 21.00    Types: Cigarettes    Quit date: 03/25/1999  . Smokeless tobacco: Never Used  . Alcohol use No     Colonoscopy:   PAP: May 2014, Dr. Toney Rakes  Bone density: March 2011, Normal  Lipid panel:  UTD, [name]  No Known Allergies  Current Outpatient Prescriptions  Medication Sig Dispense Refill  . Multiple Vitamins-Minerals (ALIVE ONCE DAILY WOMENS PO) Take by mouth.    [name] amLODipine-olmesartan (AZOR) 10-40 MG per tablet Take 1 tablet by mouth daily. 30 tablet 6   No current facility-administered medications for this visit.    OBJECTIVE: Middle-aged African Americanse  woman who appears stated age 60:   02/09/16 1119  BP: (!) 146/78  Pulse: 70  Resp: 18  Temp: 98 F (36.7 C)     Body mass index is 42.97 kg/m.    ECOG FS: 0 Filed Weights   02/09/16 1119  Weight: 220 lb (99.8 kg)   Filed Weights   02/09/16 1119  Weight: 220 lb (99.8 kg)   Sclerae unicteric, pupils round and equal Oropharynx clear and moist-- no thrush or other lesions No cervical or supraclavicular adenopathy Lungs no rales or rhonchi Heart regular rate and rhythm Abd soft, obese, nontender, positive bowel sounds MSK no focal spinal tenderness, valgus deformity big toes left greater than right Neuro: nonfocal, well oriented, appropriate affect Breasts: The right breast is unremarkable. The left breast is status post  lumpectomy and radiation. There is some deformity of the contour, but no evidence of disease recurrence. Both axillae are benign.  LAB RESULTS: Lab Results  Component Value Date   WBC 4.0 (A) 02/19/2013   NEUTROABS 2.2 12/06/2012   HGB 12.9 02/19/2013   HCT 40.8 02/19/2013   MCV 86.6 02/19/2013   PLT 259 12/06/2012      Chemistry      Component Value Date/Time   NA 143 12/06/2012 0903   K 4.2 12/06/2012 0903   CL 105 09/08/2012 1320   CO2 26 12/06/2012 0903   BUN 11.6 12/06/2012 0903   CREATININE 0.8 12/06/2012 0903      Component Value Date/Time   CALCIUM 9.2 12/06/2012 0903   ALKPHOS 56 12/06/2012 0903   AST 15 12/06/2012 0903   ALT 17 12/06/2012 0903  BILITOT 0.43 12/06/2012 0097       STUDIES: Repeat mammography at [facility] due later this month  ASSESSMENT: 60 year old [place] woman   1. Status post left lumpectomy and sentinel lymph node sampling August 2012 for a T1cN0, stage IA invasive ductal carcinoma of overlapping sites, grade 3, estrogen receptor 98% and progesterone receptor 13% positive, HER-2/neu amplified with a ratio of 2.3.   2. Oncotype showed a recurrence score of 43 ( "high risk"), predicting a 29% risk of recurrence if the patient's only adjuvant treatment was tamoxifen   3. received adjuvant docetaxel/ cyclophosphamide x4, completed December 2012   4. trastuzumab started 04/15/2011, completed 05/05/2012; most recent echo 02/18/2012  5. radiation therapy, completed July 23, 2011  6. began on tamoxifen March 2013, but never taken consistently   PLAN: Estephany is now 5 years out from definitive surgery for her breast cancer with no evidence of disease recurrence. This is very favorable.  She received chemotherapy which lowered her low risk of recurrence by one third and then she received anti-HER-2 treatment which covered the residual risk in half. Even though her compliance with antiestrogen has been poor, her prognosis is very good  overall.  As far as her foot pain is concerned I showed her the pad that I use my standard desk and discussed the need to keep 1 foot slightly higher than the other 2 prevent back pain.  Accordingly at this point I am comfortable releasing her from follow-up here. All she will need in terms of breast cancer follow-up is yearly mammography, which she obtains this month at [facility], and a yearly physician breast exam.   She knows that I will be glad to see her at any point in the future if on when the need arises, but as of now are making no routine appointment for her here.  MAGRINAT,GUSTAV C    02/09/2016

## 2016-02-18 ENCOUNTER — Telehealth: Payer: Self-pay | Admitting: *Deleted

## 2016-02-18 NOTE — Telephone Encounter (Signed)
Per Ace at [facility] call ref [phone] [place] and visit covered at $25 [place]

## 2016-02-23 ENCOUNTER — Encounter: Payer: Self-pay | Admitting: Gynecology

## 2016-02-23 ENCOUNTER — Other Ambulatory Visit: Payer: Self-pay | Admitting: Gynecology

## 2016-02-23 ENCOUNTER — Ambulatory Visit (INDEPENDENT_AMBULATORY_CARE_PROVIDER_SITE_OTHER): Payer: BC Managed Care – PPO

## 2016-02-23 ENCOUNTER — Ambulatory Visit (INDEPENDENT_AMBULATORY_CARE_PROVIDER_SITE_OTHER): Payer: BC Managed Care – PPO | Admitting: Gynecology

## 2016-02-23 VITALS — BP 128/80

## 2016-02-23 DIAGNOSIS — D251 Intramural leiomyoma of uterus: Secondary | ICD-10-CM

## 2016-02-23 DIAGNOSIS — Z853 Personal history of malignant neoplasm of breast: Secondary | ICD-10-CM

## 2016-02-23 DIAGNOSIS — N852 Hypertrophy of uterus: Secondary | ICD-10-CM | POA: Diagnosis not present

## 2016-02-23 DIAGNOSIS — D252 Subserosal leiomyoma of uterus: Secondary | ICD-10-CM

## 2016-02-23 NOTE — Progress Notes (Signed)
  Patient is a 60 year old that was seen in the office on September 29 of this year for her annual exam due to her abdominal girth and now vagina along with pendulous abdomen and ultrasound was ordered to better assess her uterus and ovaries. 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   Ultrasound from 2011: Uterus measured 9.1 x 7.2 x 5.5 cm and she had 2 fibroids intramural one measured 4.5 x 3.9 and a second one 2.5 x 2.1 cm with endometrial stripe of 3.0 millimeter   Ultrasound today: Uterus measured 9.6 x 6.3 x 4.7 cm several intramural myomas and subserous fibroids. The largest intramural myoma measures 6.2 x 4.0 x 4.1 cm in the subserous myoma measuring 2.5 x 2.6 and meters, 2.5 x 2 point for severe, 3.6 x 2.7 cm. Endometrium was unable to be identified. Right ovary appeared normal as well as left ovary. No adnexal masses and no fluid in the cul-de-sac.  Assessment/plan: Postmenopausal patient with history of breast cancer with history of fibroid uterus in 6 years since her last ultrasound. She'll return back in 6 months for follow-up ultrasound to determine stability of the fibroid it appears to be stable with exception one individual fibroid that is 6 cm that was noted. She is otherwise asymptomatic and no vaginal bleeding.  Greater than 90% time was spent counseling quantity care of this patient. Time of consultation 15 minutes

## 2016-02-23 NOTE — Patient Instructions (Addendum)
Influenza Virus Vaccine (Flucelvax) What is this medicine? INFLUENZA VIRUS VACCINE (in floo EN zuh VAHY ruhs vak SEEN) helps to reduce the risk of getting influenza also known as the flu. The vaccine only helps protect you against some strains of the flu. This medicine may be used for other purposes; ask your health care provider or pharmacist if you have questions. What should I tell my health care provider before I take this medicine? They need to know if you have any of these conditions: -bleeding disorder like hemophilia -fever or infection -Guillain-Barre syndrome or other neurological problems -immune system problems -infection with the human immunodeficiency virus (HIV) or AIDS -low blood platelet counts -multiple sclerosis -an unusual or allergic reaction to influenza virus vaccine, other medicines, foods, dyes or preservatives -pregnant or trying to get pregnant -breast-feeding How should I use this medicine? This vaccine is for injection into a muscle. It is given by a health care professional. A copy of Vaccine Information Statements will be given before each vaccination. Read this sheet carefully each time. The sheet may change frequently. Talk to your pediatrician regarding the use of this medicine in children. Special care may be needed. Overdosage: If you think you've taken too much of this medicine contact a poison control center or emergency room at once. Overdosage: If you think you have taken too much of this medicine contact a poison control center or emergency room at once. NOTE: This medicine is only for you. Do not share this medicine with others. What if I miss a dose? This does not apply. What may interact with this medicine? -chemotherapy or radiation therapy -medicines that lower your immune system like etanercept, anakinra, infliximab, and adalimumab -medicines that treat or prevent blood clots like warfarin -phenytoin -steroid medicines like prednisone or  cortisone -theophylline -vaccines This list may not describe all possible interactions. Give your health care provider a list of all the medicines, herbs, non-prescription drugs, or dietary supplements you use. Also tell them if you smoke, drink alcohol, or use illegal drugs. Some items may interact with your medicine. What should I watch for while using this medicine? Report any side effects that do not go away within 3 days to your doctor or health care professional. Call your health care provider if any unusual symptoms occur within 6 weeks of receiving this vaccine. You may still catch the flu, but the illness is not usually as bad. You cannot get the flu from the vaccine. The vaccine will not protect against colds or other illnesses that may cause fever. The vaccine is needed every year. What side effects may I notice from receiving this medicine? Side effects that you should report to your doctor or health care professional as soon as possible: -allergic reactions like skin rash, itching or hives, swelling of the face, lips, or tongue Side effects that usually do not require medical attention (Report these to your doctor or health care professional if they continue or are bothersome.): -fever -headache -muscle aches and pains -pain, tenderness, redness, or swelling at the injection site -tiredness This list may not describe all possible side effects. Call your doctor for medical advice about side effects. You may report side effects to FDA at 1-800-FDA-1088. Where should I keep my medicine? The vaccine will be given by a health care professional in a clinic, pharmacy, doctor's office, or other health care setting. You will not be given vaccine doses to store at home. NOTE: This sheet is a summary. It may not cover   all possible information. If you have questions about this medicine, talk to your doctor, pharmacist, or health care provider.    2016, Elsevier/Gold Standard. (2011-04-07  14:06:47) Uterine Fibroids Uterine fibroids are tissue masses (tumors) that can develop in the womb (uterus). They are also called leiomyomas. This type of tumor is not cancerous (benign) and does not spread to other parts of the body outside of the pelvic area, which is between the hip bones. Occasionally, fibroids may develop in the fallopian tubes, in the cervix, or on the support structures (ligaments) that surround the uterus. You can have one or many fibroids. Fibroids can vary in size, weight, and where they grow in the uterus. Some can become quite large. Most fibroids do not require medical treatment. CAUSES A fibroid can develop when a single uterine cell keeps growing (replicating). Most cells in the human body have a control mechanism that keeps them from replicating without control. SIGNS AND SYMPTOMS Symptoms may include:   Heavy bleeding during your period.  Bleeding or spotting between periods.  Pelvic pain and pressure.  Bladder problems, such as needing to urinate more often (urinary frequency) or urgently.  Inability to reproduce offspring (infertility).  Miscarriages. DIAGNOSIS Uterine fibroids are diagnosed through a physical exam. Your health care provider may feel the lumpy tumors during a pelvic exam. Ultrasonography and an MRI may be done to determine the size, location, and number of fibroids. TREATMENT Treatment may include:  Watchful waiting. This involves getting the fibroid checked by your health care provider to see if it grows or shrinks. Follow your health care provider's recommendations for how often to have this checked.  Hormone medicines. These can be taken by mouth or given through an intrauterine device (IUD).  Surgery.  Removing the fibroids (myomectomy) or the uterus (hysterectomy).  Removing blood supply to the fibroids (uterine artery embolization). If fibroids interfere with your fertility and you want to become pregnant, your health care  provider may recommend having the fibroids removed.  HOME CARE INSTRUCTIONS  Keep all follow-up visits as directed by your health care provider. This is important.  Take medicines only as directed by your health care provider.  If you were prescribed a hormone treatment, take the hormone medicines exactly as directed.  Do not take aspirin, because it can cause bleeding.  Ask your health care provider about taking iron pills and increasing the amount of dark green, leafy vegetables in your diet. These actions can help to boost your blood iron levels, which may be affected by heavy menstrual bleeding.  Pay close attention to your period and tell your health care provider about any changes, such as:  Increased blood flow that requires you to use more pads or tampons than usual per month.  A change in the number of days that your period lasts per month.  A change in symptoms that are associated with your period, such as abdominal cramping or back pain. SEEK MEDICAL CARE IF:  You have pelvic pain, back pain, or abdominal cramps that cannot be controlled with medicines.  You have an increase in bleeding between and during periods.  You soak tampons or pads in a half hour or less.  You feel lightheaded, extra tired, or weak. SEEK IMMEDIATE MEDICAL CARE IF:  You faint.  You have a sudden increase in pelvic pain.   This information is not intended to replace advice given to you by your health care provider. Make sure you discuss any questions you have with your  health care provider.   Document Released: 04/23/2000 Document Revised: 05/17/2014 Document Reviewed: 10/23/2013 Elsevier Interactive Patient Education [employer].

## 2016-02-24 ENCOUNTER — Encounter: Payer: Self-pay | Admitting: Gynecology

## 2016-04-22 ENCOUNTER — Encounter: Payer: Self-pay | Admitting: Anesthesiology

## 2016-05-10 DIAGNOSIS — C50919 Malignant neoplasm of unspecified site of unspecified female breast: Secondary | ICD-10-CM

## 2016-05-10 HISTORY — DX: Malignant neoplasm of unspecified site of unspecified female breast: C50.919

## 2016-08-20 ENCOUNTER — Other Ambulatory Visit: Payer: BC Managed Care – PPO

## 2016-08-20 ENCOUNTER — Ambulatory Visit: Payer: BC Managed Care – PPO | Admitting: Gynecology

## 2016-09-22 ENCOUNTER — Encounter: Payer: Self-pay | Admitting: Gynecology

## 2016-09-29 ENCOUNTER — Encounter: Payer: Self-pay | Admitting: Gynecology

## 2016-09-29 ENCOUNTER — Ambulatory Visit (INDEPENDENT_AMBULATORY_CARE_PROVIDER_SITE_OTHER): Payer: BC Managed Care – PPO

## 2016-09-29 ENCOUNTER — Ambulatory Visit (INDEPENDENT_AMBULATORY_CARE_PROVIDER_SITE_OTHER): Payer: BC Managed Care – PPO | Admitting: Gynecology

## 2016-09-29 VITALS — BP 120/78

## 2016-09-29 DIAGNOSIS — D251 Intramural leiomyoma of uterus: Secondary | ICD-10-CM

## 2016-09-29 NOTE — Patient Instructions (Signed)

## 2016-09-29 NOTE — Progress Notes (Signed)
    Patient is a 61 year old who was seen in the office in September of last year for her annual exam and due to her abdominal girth and ultrasound had been ordered. 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 Ultrasound today:  Uterus measured 11.2 x 6.4 x 4.7 cm several fibroids the largest one measuring 6 x 4.0 x 4.9 cm right ovary not seen endometrial cavity not seen left ovary normal no fluid in the cul-de-sac   Ultrasound from 2011: Uterus measured 9.1 x 7.2 x 5.5 cm and she had 2 fibroids intramural one measured 4.5 x 3.9 and a second one 2.5 x 2.1 cm with endometrial stripe of 3.0 millimeter  Ultrasound October 2017: terus measured 9.6 x 6.3 x 4.7 cm several intramural myomas and subserous fibroids. The largest intramural myoma measures 6.2 x 4.0 x 4.1 cm in the subserous myoma measuring 2.5 x 2.6 and meters, 2.5 x 2 point for severe, 3.6 x 2.7 cm. Endometrium was unable to be identified. Right ovary appeared normal as well as left ovary. No adnexal masses and no fluid in the cul-de-sac.  Ultrasound today 2018: Uterus measured 11.2 x 6.4 x 4.7 cm 4 fibroids intramural and subserosal identified the largest one measuring 6.0 x 4.0 x 4.9 cm right ovary not seen. Endometrial cavity not seen left ovary normal no fluid in the cul-de-sac.  Assessment/plan: Postmenopausal patient with past history of breast cancer with apparent stable fibroid uterus asymptomatic. Patient due for annual exam September of this year. Would recommend follow-up ultrasound in one year to monitor stability of fibroid.

## 2016-12-16 ENCOUNTER — Encounter (HOSPITAL_COMMUNITY): Payer: Self-pay

## 2017-03-23 ENCOUNTER — Encounter: Payer: Self-pay | Admitting: Gynecology

## 2017-04-05 ENCOUNTER — Other Ambulatory Visit: Payer: Self-pay | Admitting: Radiology

## 2017-04-05 ENCOUNTER — Encounter: Payer: Self-pay | Admitting: Gynecology

## 2017-04-13 ENCOUNTER — Encounter: Payer: Self-pay | Admitting: Gynecology

## 2017-04-21 ENCOUNTER — Telehealth: Payer: Self-pay | Admitting: Oncology

## 2017-04-21 ENCOUNTER — Other Ambulatory Visit: Payer: Self-pay | Admitting: Oncology

## 2017-04-21 NOTE — Telephone Encounter (Signed)
Received new patient referral for established patient. Spoke with GM who will see patient tomorrow 12/14 @ 1:30 pm - est 30. Spoke with patient re appointment date/time.   Confirmed appointment with referring office ([facility]).

## 2017-04-21 NOTE — Progress Notes (Signed)
ID: [name]   DOB: [birth year]  MR#: [id number]  HAL#:[id number]  PCP: Marton Redwood, MD GYN: [name], MD SU: Alphonsa Overall, MD OTHER MD: Arloa Koh, MD   CHIEF COMPLAINT:  Left Breast Cancer  CURRENT TREATMENT: Observation   HISTORY OF PRESENT ILLNESS: [name] is a  woman referred by [name] for treatment of left breast cancer.   The patient underwent screening bilateral mammography July 04, 2009, at [facility] and this showed only a scattered fibroglandular densities. However, repeat screening exam Sep 29, 2010 showed a potential abnormality in the left breast, retroareolar region. The patient was recalled for additional views Oct 08, 2010 and Dr. [name] was able to locate the nodular density noted on the screening exam. It measured approximately 8 mm. Ultrasound showed a second nodular density just lateral to the nipple areolar complex. In that area, there was what appears to be either a small cluster of cysts or perhaps a solid nodule. In particular, the nodule seen in the 3 o'clock position was felt to be most likely a fibroadenoma. The other area, however, required biopsy and this was performed October 26, 2010. The pathology from this procedure ([id number]) showed a high-grade invasive ductal carcinoma which was estrogen-receptor positive at 98%, progesterone-receptor positive at 13% with an elevated MIB-1 at 69% and no evidence of HER-2 amplification, the ratio by CISH being 1.32.   With this information the patient was referred for bilateral breast MRIs and these were performed October 30, 2010. In the left breast there was a 1.8 cm ill-defined area in the central breast associated with post biopsy changes. There were really no other suspicious areas in either breast and no bony abnormalities and no abnormal appearing or enlarged lymph nodes.   Patient underwent left lumpectomy and sentinel lymph node sampling December 10, 2010 ([id number]) for a 1.1-cm invasive ductal carcinoma,  grade 3, with 0/2 sentinel lymph nodes involved and so T1c N0 (stage I); the tumor being estrogen receptor 98% and progesterone receptor 13% positive, with no HER-2 amplification (by initial determination, but amplified on repeat with a ratio of 2.30); with an Oncotype DX score of 43 predicting a 29% risk of recurrence if she takes 5 years of tamoxifen. (Her-2 was negative on the Oncotype DX determination, which uses a completely different method).   Her subsequent history is as detailed below.   INTERVAL HISTORY: [name] returns today for evaluation and treatment of recurrent left breast cancer accompanied by her mother.  Recall she was discharged from follow-up here at the last visit with me, February 23, 2017. She reports that she was taking tamoxifen in the past, but she did not tolerate this well, because it gave her leg cramps.  Since her last visit, she underwent diagnostic bilateral mammography with tomography on 03/21/2017 at [facility] with results showing: breast density category C. The heterogeneous calcification in the left breast is at an intermediate suspicion for malignancy. A sterotactic biopsy is reccomended. An ultrasound of the left axilla on 04/13/2017 at [facility] found no sonographic evidence of malignancy.   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 Microscopic focus suspicious for invasion. Estrogen receptor: 90% strong staining intensity. Progesterone receptor: 30% moderate staining intensity.    Her case was presented at the multidisciplinary breast cancer conference 04/13/2017.  REVIEW OF SYSTEMS: [name] reports that's reports that she went to her mammogram and they found calcifications. She reports that she  is possibly going to have a mastectomy and have genetics counseling, but she is going to wait for the procedure until March. She reports that she is still living by herself and  she has 1 cat. She notes that her gynecologist Dr. Toney Rakes has retired.  She goes to the gym 2 or 3 times most weeks she denies unusual headaches, visual changes, nausea, vomiting, or dizziness. There has been no unusual cough, phlegm production, or pleurisy. This been no change in bowel or bladder habits. She denies unexplained fatigue or unexplained weight loss, bleeding, rash, or fever. A detailed review of systems was otherwise stable.    PAST MEDICAL HISTORY: Past Medical History:  Diagnosis Date  . Breast cancer ([place])    stage I high grade invasive ductal ca left breast  . Breast lump    left  . Cancer ([place])   . Diabetes mellitus    borderline  . Gum disease   . Hypertension   . Sleep apnea     PAST SURGICAL HISTORY: Past Surgical History:  Procedure Laterality Date  . BREAST LUMPECTOMY  2012   left  . FOOT MASS EXCISION    . LAPAROSCOPIC GASTRIC BANDING    . MASS EXCISION     back  . PORTACATH PLACEMENT  02/11/11  . PORTACATH REMOVAL  MARCH 2014  . RESECTOSCOPIC POLYPECTOMY  2001   MYOMECTOMY  . SOFT TISSUE CYST EXCISION     back cyst  . VAGINAL MASS EXCISION      FAMILY HISTORY Family History  Problem Relation Age of Onset  . Cancer Maternal Uncle 70       prostate  . Alcohol abuse Father   . Cirrhosis Father   . Cancer Mother        breast    GYNECOLOGIC HISTORY: ((Updated 01/26/2013) She had menarche at age 48. She is GX P0.  Last menstrual period was late May 2012.  She has never used hormone replacement.    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 She attends a [employer]. She goes to the gym at least three times a week.    ADVANCED DIRECTIVES:Not in place  HEALTH MAINTENANCE: Social History   Tobacco Use  . Smoking status: Former Smoker    Packs/day: 1.00    Years: 21.00    Pack years: 21.00    Types: Cigarettes    Last attempt to quit: 03/25/1999    Years since quitting:  18.0  . Smokeless tobacco: Never Used  Substance Use Topics  . Alcohol use: No    Alcohol/week: 0.0 oz  . Drug use: No     Colonoscopy:   PAP: May 2014, Dr. Toney Rakes  Bone density: March 2011, Normal  Lipid panel:  UTD, [name]  No Known Allergies  Current Outpatient Medications  Medication Sig Dispense Refill  . amLODipine-olmesartan (AZOR) 10-40 MG per tablet Take 1 tablet by mouth daily. 30 tablet 6  . Multiple Vitamins-Minerals (ALIVE ONCE DAILY WOMENS PO) Take by mouth.     No current facility-administered medications for this visit.    OBJECTIVE: Middle-aged African Americanse  woman in no acute distress Vitals:   04/22/17 1342  BP: 129/69  Pulse: 77  Resp: 16  Temp: 98.6 F (37 C)  SpO2: 98%     Body mass index is 44.04 kg/m.    ECOG FS: 0 Filed Weights   04/22/17 1342  Weight: 225 lb 8 oz (102.3 kg)  Filed Weights   04/22/17 1342  Weight: 225 lb 8 oz (102.3 kg)   Sclerae unicteric, pupils round and equal Oropharynx clear and moist No cervical or supraclavicular adenopathy Lungs no rales or rhonchi Heart regular rate and rhythm Abd soft, nontender, positive bowel sounds MSK no focal spinal tenderness, no upper extremity lymphedema Neuro: nonfocal, well oriented, appropriate affect Breasts: The right breast is benign.  The left breast is status post prior lumpectomy and radiation.  The contour of the breast is altered by the prior surgery, but there has been no significant change on exam.  Both axillae are benign  LAB RESULTS: Lab Results  Component Value Date   WBC 4.6 04/22/2017   NEUTROABS 2.7 04/22/2017   HGB 12.2 04/22/2017   HCT 38.1 04/22/2017   MCV 84.3 04/22/2017   PLT 269 04/22/2017      Chemistry      Component Value Date/Time   NA 143 12/06/2012 0903   K 4.2 12/06/2012 0903   CL 105 09/08/2012 1320   CO2 26 12/06/2012 0903   BUN 11.6 12/06/2012 0903   CREATININE 0.8 12/06/2012 0903      Component Value Date/Time   CALCIUM  9.2 12/06/2012 0903   ALKPHOS 56 12/06/2012 0903   AST 15 12/06/2012 0903   ALT 17 12/06/2012 0903   BILITOT 0.43 12/06/2012 0903       STUDIES: Since her last visit, she underwent diagnostic bilateral mammography with tomography on 03/21/2017 at [facility] with results showing: breast density category C. The heterogeneous calcification in the left breast is at an intermediate suspicion for malignancy. A sterotactic biopsy is reccomended. An ultrasound of the left axilla on 04/13/2017 at [facility] found no sonographic evidence of malignancy.   ASSESSMENT: 61 year old [place] woman   1. Status post left lumpectomy and sentinel lymph node sampling August 2012 for a T1cN0, stage IA invasive ductal carcinoma of overlapping sites, grade 3, estrogen receptor 98% and progesterone receptor 13% positive, HER-2/neu amplified with a ratio of 2.3.   2. Oncotype showed a recurrence score of 43 ( "high risk"), predicting a 29% risk of recurrence if the patient's only adjuvant treatment was tamoxifen   3. received adjuvant docetaxel/ cyclophosphamide x4, completed December 2012   4. trastuzumab started 04/15/2011, completed 05/05/2012; final echo 02/18/2012  5. radiation therapy, completed July 23, 2011  6. began on tamoxifen March 2013, but never taken consistently   RECURRENT DISEASE: November 2018 7.  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  Because the breast was previously irradiated, mastectomy is the treatment of choice.  [name] understands that in noninvasive ductal carcinoma, also called ductal carcinoma in situ ("DCIS") the breast cancer cells remain trapped in the ducts were they started. They cannot travel to a vital organ. For that reason these cancers in themselves are not life-threatening.  If the whole  breast is removed then all the ducts are removed and since the cancer cells are trapped in the ducts, the cure rate with mastectomy for noninvasive breast cancer is approximately 99%.   She has a one half of 1 %/year chance of developing yet another breast cancer in the remaining white breast.  She can cut that risk in half by taking antiestrogens for 5 years.  She tolerated tamoxifen poorly previously.  Accordingly today we discussed anastrozole.  She has a good  understanding of the possible toxicities, side effects and complications of this agent.  I suggested that she started now, so that if she ends up postponing the surgery longer than originally planned, she does not have to worry about progression of disease.  She wanted to know if he would be possible to not have surgery and only take anastrozole.  She does not qualify for the COMET trial since she had prior cancer.  While we sometimes do observation alone with or without antiestrogens and very elderly patients, that is not the standard of care.    The plan accordingly is for her to start anastrozole now.  She is planning to have surgery with reconstruction sometime in March.  I have scheduled her to see me again in April.  If she tolerated the anastrozole well the plan would be to continue that for 5 years.  I am not sure whether or not she qualifies for genetics testing.  I have sent an inquiry to 1 of our counselors.  If she does qualify she will be set up for that preop.  [name] is a good understanding of this plan.  She agrees with it.  She knows the goal of treatment in her case is cure.  She will call with any issues that may develop before her next visit here.  Koron Godeaux, Virgie Dad, MD  04/22/17 1:59 PM Medical Oncology and Hematology [facility] [address] [place], [place] [address] Tel. [phone]    Fax. [phone]  This document serves as a record of services personally performed by Lurline Del, MD. It  was created on his behalf by Sheron Nightingale, a trained medical scribe. The creation of this record is based on the scribe's personal observations and the provider's statements to them.   I have reviewed the above documentation for accuracy and completeness, and I agree with the above.

## 2017-04-22 ENCOUNTER — Telehealth: Payer: Self-pay | Admitting: Oncology

## 2017-04-22 ENCOUNTER — Other Ambulatory Visit (HOSPITAL_BASED_OUTPATIENT_CLINIC_OR_DEPARTMENT_OTHER): Payer: BC Managed Care – PPO

## 2017-04-22 ENCOUNTER — Ambulatory Visit (HOSPITAL_BASED_OUTPATIENT_CLINIC_OR_DEPARTMENT_OTHER): Payer: BC Managed Care – PPO | Admitting: Oncology

## 2017-04-22 VITALS — BP 129/69 | HR 77 | Temp 98.6°F | Resp 16 | Wt 225.5 lb

## 2017-04-22 DIAGNOSIS — Z9221 Personal history of antineoplastic chemotherapy: Secondary | ICD-10-CM

## 2017-04-22 DIAGNOSIS — Z170 Estrogen receptor positive status [ER+]: Secondary | ICD-10-CM | POA: Diagnosis not present

## 2017-04-22 DIAGNOSIS — C50812 Malignant neoplasm of overlapping sites of left female breast: Secondary | ICD-10-CM

## 2017-04-22 DIAGNOSIS — D0512 Intraductal carcinoma in situ of left breast: Secondary | ICD-10-CM | POA: Diagnosis not present

## 2017-04-22 DIAGNOSIS — Z853 Personal history of malignant neoplasm of breast: Secondary | ICD-10-CM | POA: Diagnosis not present

## 2017-04-22 DIAGNOSIS — C50912 Malignant neoplasm of unspecified site of left female breast: Secondary | ICD-10-CM | POA: Insufficient documentation

## 2017-04-22 LAB — COMPREHENSIVE METABOLIC PANEL
ALT: 11 U/L (ref 0–55)
ANION GAP: 10 meq/L (ref 3–11)
AST: 13 U/L (ref 5–34)
Albumin: 3.6 g/dL (ref 3.5–5.0)
Alkaline Phosphatase: 58 U/L (ref 40–150)
BUN: 15 mg/dL (ref 7.0–26.0)
CALCIUM: 9 mg/dL (ref 8.4–10.4)
CHLORIDE: 106 meq/L (ref 98–109)
CO2: 25 meq/L (ref 22–29)
CREATININE: 0.8 mg/dL (ref 0.6–1.1)
Glucose: 88 mg/dl (ref 70–140)
Potassium: 4 mEq/L (ref 3.5–5.1)
Sodium: 140 mEq/L (ref 136–145)
Total Bilirubin: 0.38 mg/dL (ref 0.20–1.20)
Total Protein: 7.1 g/dL (ref 6.4–8.3)

## 2017-04-22 LAB — CBC WITH DIFFERENTIAL/PLATELET
BASO%: 0.2 % (ref 0.0–2.0)
BASOS ABS: 0 10*3/uL (ref 0.0–0.1)
EOS ABS: 0 10*3/uL (ref 0.0–0.5)
EOS%: 0.6 % (ref 0.0–7.0)
HEMATOCRIT: 38.1 % (ref 34.8–46.6)
HGB: 12.2 g/dL (ref 11.6–15.9)
LYMPH#: 1.6 10*3/uL (ref 0.9–3.3)
LYMPH%: 33.8 % (ref 14.0–49.7)
MCH: 27 pg (ref 25.1–34.0)
MCHC: 32 g/dL (ref 31.5–36.0)
MCV: 84.3 fL (ref 79.5–101.0)
MONO#: 0.3 10*3/uL (ref 0.1–0.9)
MONO%: 6.9 % (ref 0.0–14.0)
NEUT#: 2.7 10*3/uL (ref 1.5–6.5)
NEUT%: 58.5 % (ref 38.4–76.8)
PLATELETS: 269 10*3/uL (ref 145–400)
RBC: 4.52 10*6/uL (ref 3.70–5.45)
RDW: 14.4 % (ref 11.2–14.5)
WBC: 4.6 10*3/uL (ref 3.9–10.3)

## 2017-04-22 MED ORDER — ANASTROZOLE 1 MG PO TABS: 1.0000 mg | ORAL_TABLET | Freq: Every day | ORAL | 4 refills | Status: DC

## 2017-04-22 NOTE — Telephone Encounter (Signed)
Patient declined AVS and calendar of upcoming April 2019 appointments.

## 2017-06-03 ENCOUNTER — Telehealth: Payer: Self-pay | Admitting: Oncology

## 2017-06-03 NOTE — Telephone Encounter (Signed)
Scheduled appt per 1/25 sch message - sent reminder letter in the mail with appt date and time.  

## 2017-06-23 ENCOUNTER — Encounter (INDEPENDENT_AMBULATORY_CARE_PROVIDER_SITE_OTHER): Payer: Self-pay | Admitting: Orthopaedic Surgery

## 2017-06-23 ENCOUNTER — Ambulatory Visit (INDEPENDENT_AMBULATORY_CARE_PROVIDER_SITE_OTHER): Payer: BC Managed Care – PPO

## 2017-06-23 ENCOUNTER — Ambulatory Visit (INDEPENDENT_AMBULATORY_CARE_PROVIDER_SITE_OTHER): Payer: BC Managed Care – PPO | Admitting: Orthopaedic Surgery

## 2017-06-23 DIAGNOSIS — G8929 Other chronic pain: Secondary | ICD-10-CM

## 2017-06-23 DIAGNOSIS — M25551 Pain in right hip: Secondary | ICD-10-CM | POA: Diagnosis not present

## 2017-06-23 DIAGNOSIS — M25561 Pain in right knee: Secondary | ICD-10-CM | POA: Diagnosis not present

## 2017-06-23 DIAGNOSIS — M7061 Trochanteric bursitis, right hip: Secondary | ICD-10-CM | POA: Insufficient documentation

## 2017-06-23 DIAGNOSIS — M1711 Unilateral primary osteoarthritis, right knee: Secondary | ICD-10-CM

## 2017-06-23 NOTE — Progress Notes (Signed)
Office Visit Note   Patient: [name]           Date of Birth: [birth year]           MRN: [id number] Visit Date: 06/23/2017              Requested by: Marton Redwood, MD [address] [place], [place] [address] PCP: Marton Redwood, MD   Assessment & Plan: Visit Diagnoses:  1. Chronic pain of right knee   2. Pain in right hip   3. Unilateral primary osteoarthritis, right knee   4. Trochanteric bursitis, right hip     Plan: I offered her injections in her trochanteric area on the right side as well as her right knee today but she is deferring these.  She says she does like if she works on strengthening exercises and other modalities that she can get better.  I do feel that eventually steroid injection and potentially hyaluronic acid injection in her right knee would be worth trying.  Also offered physical therapy.  She will think about this.  She will try some topical over-the-counter anti-inflammatories and she does have Voltaren gel at home which she will try.  All questions concerns were answered and addressed.  She will follow-up as needed.  Follow-Up Instructions: Return if symptoms worsen or fail to improve.   Orders:  Orders Placed This Encounter  Procedures  . XR HIP UNILAT W OR W/O PELVIS 1V RIGHT  . XR Knee 1-2 Views Right   No orders of the defined types were placed in this encounter.     Procedures: No procedures performed   Clinical Data: No additional findings.   Subjective: Chief Complaint  Patient presents with  . Right Knee - Pain  . Right Hip - Pain  The patient is someone of actually seen for over a decade now.  I have not seen her though in some time.  She is been having right knee pain as well as right hip pain.  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  She is been having some right hip pain and she points the trochanteric area source for pain he denies any groin pain.  She is  a prediabetic.  She is obese and working on activity modification as much she can.  She has been recently told she had a recurrence in her breast cancer but it is one that is been called early and hopefully is treatable.  She says mainly she hurts if she is been sitting in her car for long period time driving to work when she [place] to get to work.  HPI  Review of Systems She currently denies any headache, chest pain, shortness of breath, fever, chills, nausea, vomiting.  Objective: Vital Signs: LMP 10/06/2010   Physical Exam She is alert and oriented x3 and in no acute distress Ortho Exam Examination of her right hip shows fluid range of motion of her hip with no pain in the groin.  She has severe pain of the trochanteric area and the iliotibial band.  Examination of the right knee shows significant patellofemoral crepitation with medial joint line tenderness.  The right knee is ligamentously stable with no effusion and excellent range of motion. Specialty Comments:  No specialty comments available.  Imaging: Xr Hip Unilat W Or W/o Pelvis 1v Right  Result Date: 06/23/2017 An AP pelvis and lateral of the right hip shows  no acute findings.  There is no significant arthritic changes in either hip.  There is no acute findings of the trochanteric area on either side.  Xr Knee 1-2 Views Right  Result Date: 06/23/2017 2 views of the right knee show moderate arthritic changes with medial joint space narrowing, slight varus malalignment, periarticular osteophytes in all 3 compartments especially the patellofemoral joint.    PMFS History: Patient Active Problem List   Diagnosis Date Noted  . Trochanteric bursitis, right hip 06/23/2017  . Unilateral primary osteoarthritis, right knee 06/23/2017  . Recurrent breast cancer, left ([facility]) 04/22/2017  . 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 Malignant neoplasm of overlapping sites of left breast in female,  estrogen receptor positive ([facility]) 02/09/2016  . History of breast cancer, left, T1, N0, Her2Neu postive. 12/31/2010  . Hx of laparoscopic gastric banding 12/07/2010  . Morbidly obese ([facility]) 11/04/2010  . Hypertension 11/04/2010  . Wears glasses 11/04/2010  . Gum disease 11/04/2010  . Breast lump 11/04/2010   Past Medical History:  Diagnosis Date  . 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 Breast lump    left  . Cancer ([facility])   . Diabetes mellitus    borderline  . Gum disease   . Hypertension   . Sleep apnea     Family History  Problem Relation Age of Onset  . Cancer Maternal Uncle 70       prostate  . Alcohol abuse Father   . Cirrhosis Father   . Cancer Mother        breast    Past Surgical History:  Procedure Laterality Date  . BREAST LUMPECTOMY  2012   left  . FOOT MASS EXCISION    . LAPAROSCOPIC GASTRIC BANDING    . MASS EXCISION     back  . PORTACATH PLACEMENT  02/11/11  . PORTACATH REMOVAL  MARCH 2014  . RESECTOSCOPIC POLYPECTOMY  2001   MYOMECTOMY  . SOFT TISSUE CYST EXCISION     back cyst  . VAGINAL MASS EXCISION     Social History   Occupational History  . Not on file  Tobacco Use  . Smoking status: Former Smoker    Packs/day: 1.00    Years: 21.00    Pack years: 21.00    Types: Cigarettes    Last attempt to quit: 03/25/1999    Years since quitting: 18.2  . Smokeless tobacco: Never Used  Substance and Sexual Activity  . Alcohol use: No    Alcohol/week: 0.0 oz  . Drug use: No  . Sexual activity: Yes    Birth control/protection: Condom

## 2017-07-20 ENCOUNTER — Encounter: Payer: BC Managed Care – PPO | Admitting: Genetic Counselor

## 2017-07-20 ENCOUNTER — Other Ambulatory Visit: Payer: BC Managed Care – PPO

## 2017-08-15 ENCOUNTER — Other Ambulatory Visit: Payer: Self-pay

## 2017-08-15 ENCOUNTER — Encounter: Payer: Self-pay | Admitting: Family Medicine

## 2017-08-15 ENCOUNTER — Ambulatory Visit: Payer: BC Managed Care – PPO | Admitting: Family Medicine

## 2017-08-15 VITALS — BP 142/80 | HR 73 | Temp 98.0°F | Ht 60.0 in | Wt 228.6 lb

## 2017-08-15 DIAGNOSIS — R059 Cough, unspecified: Secondary | ICD-10-CM

## 2017-08-15 DIAGNOSIS — J069 Acute upper respiratory infection, unspecified: Secondary | ICD-10-CM | POA: Diagnosis not present

## 2017-08-15 DIAGNOSIS — J9801 Acute bronchospasm: Secondary | ICD-10-CM

## 2017-08-15 DIAGNOSIS — R05 Cough: Secondary | ICD-10-CM

## 2017-08-15 MED ORDER — ALBUTEROL SULFATE HFA 108 (90 BASE) MCG/ACT IN AERS: 1.0000 | INHALATION_SPRAY | RESPIRATORY_TRACT | 0 refills | Status: DC | PRN

## 2017-08-15 NOTE — Patient Instructions (Addendum)
Your symptoms appear to be due to a virus, and appear to be improving.  Okay to take Mucinex or Mucinex DM as needed for congestion/cough, but stop that medicine if it worsens wheezing.  See information below on wheezing as well as use of inhaler.  Albuterol inhaler was prescribed, can be used up to every 4 hours as needed, but if you require that more than 2-3 times per day, or persistently need that medicine in the next 3 days, return for recheck.  Return to the clinic or go to the nearest emergency room if any of your symptoms worsen or new symptoms occur.  How to Use a Metered Dose Inhaler A metered dose inhaler is a handheld device for taking medicine that must be breathed into the lungs (inhaled). The device can be used to deliver a variety of inhaled medicines, including:  Quick relief or rescue medicines, such as bronchodilators.  Controller medicines, such as corticosteroids.  The medicine is delivered by pushing down on a metal canister to release a preset amount of spray and medicine. Each device contains the amount of medicine that is needed for a preset number of uses (inhalations). Your health care provider may recommend that you use a spacer with your inhaler to help you take the medicine more effectively. A spacer is a plastic tube with a mouthpiece on one end and an opening that connects to the inhaler on the other end. A spacer holds the medicine in a tube for a short time, which allows you to inhale more medicine. What are the risks? If you do not use your inhaler correctly, medicine might not reach your lungs to help you breathe. Inhaler medicine can cause side effects, such as:  Mouth or throat infection.  Cough.  Hoarseness.  Headache.  Nausea and vomiting.  Lung infection (pneumonia) in people who have a lung condition called COPD.  How to use a metered dose inhaler without a spacer 1. Remove the cap from the inhaler. 2. If you are using the inhaler for the first  time, shake it for 5 seconds, turn it away from your face, then release 4 puffs into the air. This is called priming. 3. Shake the inhaler for 5 seconds. 4. Position the inhaler so the top of the canister faces up. 5. Put your index finger on the top of the medicine canister. Support the bottom of the inhaler with your thumb. 6. Breathe out normally and as completely as possible, away from the inhaler. 7. Either place the inhaler between your teeth and close your lips tightly around the mouthpiece, or hold the inhaler 1-2 inches (2.5-5 cm) away from your open mouth. Keep your tongue down out of the way. If you are unsure which technique to use, ask your health care provider. 8. Press the canister down with your index finger to release the medicine, then inhale deeply and slowly through your mouth (not your nose) until your lungs are completely filled. Inhaling should take 4-6 seconds. 9. Hold the medicine in your lungs for 5-10 seconds (10 seconds is best). This helps the medicine get into the small airways of your lungs. 10. With your lips in a tight circle (pursed), breathe out slowly. 11. Repeat steps 3-10 until you have taken the number of puffs that your health care provider directed. Wait about 1 minute between puffs or as directed. 12. Put the cap on the inhaler. 13. If you are using a steroid inhaler, rinse your mouth with water, gargle, and  spit out the water. Do not swallow the water. How to use a metered dose inhaler with a spacer 1. Remove the cap from the inhaler. 2. If you are using the inhaler for the first time, shake it for 5 seconds, turn it away from your face, then release 4 puffs into the air. This is called priming. 3. Shake the inhaler for 5 seconds. 4. Place the open end of the spacer onto the inhaler mouthpiece. 5. Position the inhaler so the top of the canister faces up and the spacer mouthpiece faces you. 6. Put your index finger on the top of the medicine canister.  Support the bottom of the inhaler and the spacer with your thumb. 7. Breathe out normally and as completely as possible, away from the spacer. 8. Place the spacer between your teeth and close your lips tightly around it. Keep your tongue down out of the way. 9. Press the canister down with your index finger to release the medicine, then inhale deeply and slowly through your mouth (not your nose) until your lungs are completely filled. Inhaling should take 4-6 seconds. 10. Hold the medicine in your lungs for 5-10 seconds (10 seconds is best). This helps the medicine get into the small airways of your lungs. 11. With your lips in a tight circle (pursed), breathe out slowly. 12. Repeat steps 3-11 until you have taken the number of puffs that your health care provider directed. Wait about 1 minute between puffs or as directed. 13. Remove the spacer from the inhaler and put the cap on the inhaler. 14. If you are using a steroid inhaler, rinse your mouth with water, gargle, and spit out the water. Do not swallow the water. Follow these instructions at home:  Take your inhaled medicine only as told by your health care provider. Do not use the inhaler more than directed by your health care provider.  Keep all follow-up visits as told by your health care provider. This is important.  If your inhaler has a counter, you can check it to determine how full your inhaler is. If your inhaler does not have a counter, ask your health care provider when you will need to refill your inhaler and write the refill date on a calendar or on your inhaler canister. Note that you cannot know when an inhaler is empty by shaking it.  Follow directions on the package insert for care and cleaning of your inhaler and spacer. Contact a health care provider if:  Symptoms are only partially relieved with your inhaler.  You are having trouble using your inhaler.  You have an increase in phlegm.  You have headaches. Get help  right away if:  You feel little or no relief after using your inhaler.  You have dizziness.  You have a fast heart rate.  You have chills or a fever.  You have night sweats.  There is blood in your phlegm. Summary  A metered dose inhaler is a handheld device for taking medicine that must be breathed into the lungs (inhaled).  The medicine is delivered by pushing down on a metal canister to release a preset amount of spray and medicine.  Each device contains the amount of medicine that is needed for a preset number of uses (inhalations). This information is not intended to replace advice given to you by your health care provider. Make sure you discuss any questions you have with your health care provider. Document Released: 04/26/2005 Document Revised: 03/16/2016 Document Reviewed: 03/16/2016  Elsevier Interactive Patient Education  2017 [place].  Bronchospasm, Adult Bronchospasm is a tightening of the airways going into the lungs. During an episode, it may be harder to breathe. You may cough, and you may make a whistling sound when you breathe (wheeze). This condition often affects people with asthma. What are the causes? This condition is caused by swelling and irritation in the airways. It can be triggered by:  An infection (common).  Seasonal allergies.  An allergic reaction.  Exercise.  Irritants. These include pollution, cigarette smoke, strong odors, aerosol sprays, and paint fumes.  Weather changes. Winds increase molds and pollens in the air. Cold air may cause swelling.  Stress and emotional upset.  What are the signs or symptoms? Symptoms of this condition include:  Wheezing. If the episode was triggered by an allergy, wheezing may start right away or hours later.  Nighttime coughing.  Frequent or severe coughing with a simple cold.  Chest tightness.  Shortness of breath.  Decreased ability to exercise.  How is this diagnosed? This condition  is usually diagnosed with a review of your medical history and a physical exam. Tests, such as lung function tests, are sometimes done to look for other conditions. The need for a chest X-ray depends on where the wheezing occurs and whether it is the first time you have wheezed. How is this treated? This condition may be treated with:  Inhaled medicines. These open up the airways and help you breathe. They can be taken with an inhaler or a nebulizer device.  Corticosteroid medicines. These may be given for severe bronchospasm, usually when it is associated with asthma.  Avoiding triggers, such as irritants, infection, or allergies.  Follow these instructions at home: Medicines  Take over-the-counter and prescription medicines only as told by your health care provider.  If you need to use an inhaler or nebulizer to take your medicine, ask your health care provider to explain how to use it correctly. If you were given a spacer, always use it with your inhaler. Lifestyle  Reduce the number of triggers in your home. To do this: ? Change your heating and air conditioning filter at least once a month. ? Limit your use of fireplaces and wood stoves. ? Do not smoke. Do not allow smoking in your home. ? Avoid using perfumes and fragrances. ? Get rid of pests, such as roaches and mice, and their droppings. ? Remove any mold from your home. ? Keep your house clean and dust free. Use unscented cleaning products. ? Replace carpet with wood, tile, or vinyl flooring. Carpet can trap dander and dust. ? Use allergy-proof pillows, mattress covers, and box spring covers. ? Wash bed sheets and blankets every week in hot water. Dry them in a dryer. ? Use blankets that are made of polyester or cotton. ? Wash your hands often. ? Do not allow pets in your bedroom.  Avoid breathing in cold air when you exercise. General instructions  Have a plan for seeking medical care. Know when to call your health care  provider and local emergency services, and where to get emergency care.  Stay up to date on your immunizations.  When you have an episode of bronchospasm, stay calm. Try to relax and breathe more slowly.  If you have asthma, make sure you have an asthma action plan.  Keep all follow-up visits as told by your health care provider. This is important. Contact a health care provider if:  You have muscle aches.  You have chest pain.  The mucus that you cough up (sputum) changes from clear or white to yellow, green, gray, or bloody.  You have a fever.  Your sputum gets thicker. Get help right away if:  Your wheezing and coughing get worse, even after you take your prescribed medicines.  It gets even harder to breathe.  You develop severe chest pain. Summary  Bronchospasm is a tightening of the airways going into the lungs.  During an episode of bronchospasm, you may have a harder time breathing. You may cough and make a whistling sound when you breathe (wheeze).  Avoid exposure to triggers such as smoke, dust, mold, animal dander, and fragrances.  When you have an episode of bronchospasm, stay calm. Try to relax and breathe more slowly. This information is not intended to replace advice given to you by your health care provider. Make sure you discuss any questions you have with your health care provider. Document Released: 04/29/2003 Document Revised: 04/22/2016 Document Reviewed: 04/22/2016 Elsevier Interactive Patient Education  2017 [name]  Upper Respiratory Infection, Adult Most upper respiratory infections (URIs) are caused by a virus. A URI affects the nose, throat, and upper air passages. The most common type of URI is often called "the common cold." Follow these instructions at home:  Take medicines only as told by your doctor.  Gargle warm saltwater or take cough drops to comfort your throat as told by your doctor.  Use a warm mist humidifier or inhale steam  from a shower to increase air moisture. This may make it easier to breathe.  Drink enough fluid to keep your pee (urine) clear or pale yellow.  Eat soups and other clear broths.  Have a healthy diet.  Rest as needed.  Go back to work when your fever is gone or your doctor says it is okay. ? You may need to stay home longer to avoid giving your URI to others. ? You can also wear a face mask and wash your hands often to prevent spread of the virus.  Use your inhaler more if you have asthma.  Do not use any tobacco products, including cigarettes, chewing tobacco, or electronic cigarettes. If you need help quitting, ask your doctor. Contact a doctor if:  You are getting worse, not better.  Your symptoms are not helped by medicine.  You have chills.  You are getting more short of breath.  You have brown or red mucus.  You have yellow or brown discharge from your nose.  You have pain in your face, especially when you bend forward.  You have a fever.  You have puffy (swollen) neck glands.  You have pain while swallowing.  You have white areas in the back of your throat. Get help right away if:  You have very bad or constant: ? Headache. ? Ear pain. ? Pain in your forehead, behind your eyes, and over your cheekbones (sinus pain). ? Chest pain.  You have long-lasting (chronic) lung disease and any of the following: ? Wheezing. ? Long-lasting cough. ? Coughing up blood. ? A change in your usual mucus.  You have a stiff neck.  You have changes in your: ? Vision. ? Hearing. ? Thinking. ? Mood. This information is not intended to replace advice given to you by your health care provider. Make sure you discuss any questions you have with your health care provider. Document Released: 10/13/2007 Document Revised: 12/28/2015 Document Reviewed: 08/01/2013 Elsevier Interactive Patient Education  2018 [employer].  IF you received an x-ray today, you will receive  an invoice from [facility]. Please contact [facility] at [phone] with questions or concerns regarding your invoice.   IF you received labwork today, you will receive an invoice from [facility]. Please contact [facility] at [phone] with questions or concerns regarding your invoice.   Our billing staff will not be able to assist you with questions regarding bills from these companies.  You will be contacted with the lab results as soon as they are available. The fastest way to get your results is to activate your My Chart account. Instructions are located on the last page of this paperwork. If you have not heard from [place] regarding the results in 2 weeks, please contact this office.

## 2017-08-15 NOTE — Progress Notes (Signed)
Subjective:  By signing my name below, I, [name], attest that this documentation has been prepared under the direction and in the presence of Tammy Agreste, MD Electronically Signed: Ladene Artist, ED Scribe 08/15/2017 at 3:32 PM.   Patient ID: [name], female    DOB: [birth year], 62 y.o.   MRN: [id number]  Chief Complaint  Patient presents with  . WHEEZING IN CHEST    since Thursday and it is getting better.    HPI [name] is a 62 y.o. female who presents to Primary Care at [facility] complaining of gradually improving wheezing x 4 days, worse at night. H/o HTN. Former smoker quit in 2000. H/o breast CA, takes Arimidex. Pt reports sore throat that has resolved, congestion, rhinorrhea, fatigue, minimally productive cough, cp only with coughing a few days ago that has resolved. She has taken Mucinex Flu and Cold. Denies fever, nausea, vomiting, sweating, cp or chest tightness at this time, h/o MI or blocked arteries. She does not have an inhaler.  Patient Active Problem List   Diagnosis Date Noted  . Trochanteric bursitis, right hip 06/23/2017  . Unilateral primary osteoarthritis, right knee 06/23/2017  . Recurrent breast cancer, left ([place]) 04/22/2017  . 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 Malignant neoplasm of overlapping sites of left breast in female, estrogen receptor positive ([place]) 02/09/2016  . History of breast cancer, left, T1, N0, Her2Neu postive. 12/31/2010  . Hx of laparoscopic gastric banding 12/07/2010  . Morbidly obese ([place]) 11/04/2010  . Hypertension 11/04/2010  . Wears glasses 11/04/2010  . Gum disease 11/04/2010  . Breast lump 11/04/2010   Past Medical History:  Diagnosis Date  . 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 Breast lump    left  . Cancer ([place])   . Diabetes mellitus    borderline  . Gum disease   . Hypertension   . Sleep apnea    Past Surgical History:  Procedure  Laterality Date  . BREAST LUMPECTOMY  2012   left  . FOOT MASS EXCISION    . LAPAROSCOPIC GASTRIC BANDING    . MASS EXCISION     back  . PORTACATH PLACEMENT  02/11/11  . PORTACATH REMOVAL  MARCH 2014  . RESECTOSCOPIC POLYPECTOMY  2001   MYOMECTOMY  . SOFT TISSUE CYST EXCISION     back cyst  . VAGINAL MASS EXCISION     No Known Allergies Prior to Admission medications   Medication Sig Start Date End Date Taking? Authorizing Provider  amLODipine-olmesartan (AZOR) 10-40 MG per tablet Take 1 tablet by mouth daily. 12/13/12   Bensimhon, Shaune Pascal, MD  anastrozole (ARIMIDEX) 1 MG tablet Take 1 tablet (1 mg total) by mouth daily. 04/22/17   Magrinat, Virgie Dad, MD  Multiple Vitamins-Minerals (ALIVE ONCE DAILY WOMENS PO) Take by mouth.    [provider]  prochlorperazine (COMPAZINE) 10 MG tablet  01/27/11 07/30/11  [provider]  triamterene-hydrochlorothiazide (DYAZIDE) 37.5-25 MG per capsule Take 1 each (1 capsule total) by mouth every morning. 06/10/11 07/30/11  Theotis Burrow, PA-C   Social History   Socioeconomic History  . Marital status: Divorced    Spouse name: Not on file  . Number of children: Not on file  . Years of education: Not on file  . Highest education level: Not on file  Occupational History  . Not on file  Social Needs  .  Financial resource strain: Not on file  . Food insecurity:    Worry: Not on file    Inability: Not on file  . Transportation needs:    Medical: Not on file    Non-medical: Not on file  Tobacco Use  . Smoking status: Former Smoker    Packs/day: 1.00    Years: 21.00    Pack years: 21.00    Types: Cigarettes    Last attempt to quit: 03/25/1999    Years since quitting: 18.4  . Smokeless tobacco: Never Used  Substance and Sexual Activity  . Alcohol use: No    Alcohol/week: 0.0 oz  . Drug use: No  . Sexual activity: Yes    Birth control/protection: Condom  Lifestyle  . Physical activity:    Days per week: Not on file     Minutes per session: Not on file  . Stress: Not on file  Relationships  . Social connections:    Talks on phone: Not on file    Gets together: Not on file    Attends religious service: Not on file    Active member of club or organization: Not on file    Attends meetings of clubs or organizations: Not on file    Relationship status: Not on file  . Intimate partner violence:    Fear of current or ex partner: Not on file    Emotionally abused: Not on file    Physically abused: Not on file    Forced sexual activity: Not on file  Other Topics Concern  . Not on file  Social History Narrative  . Not on file   Review of Systems  Constitutional: Positive for fatigue. Negative for diaphoresis and fever.  HENT: Positive for congestion, rhinorrhea and sore throat (resolved).   Respiratory: Positive for cough and wheezing. Negative for chest tightness.   Cardiovascular: Positive for chest pain (only with coughing).  Gastrointestinal: Negative for nausea and vomiting.      Objective:   Physical Exam  Constitutional: She is oriented to person, place, and time. She appears well-developed and well-nourished. No distress.  HENT:  Head: Normocephalic and atraumatic.  Right Ear: Hearing, tympanic membrane, external ear and ear canal normal.  Left Ear: Hearing, tympanic membrane, external ear and ear canal normal.  Nose: Nose normal. Right sinus exhibits no maxillary sinus tenderness and no frontal sinus tenderness. Left sinus exhibits no maxillary sinus tenderness and no frontal sinus tenderness.  Mouth/Throat: Oropharynx is clear and moist and mucous membranes are normal. No oropharyngeal exudate.  Eyes: Pupils are equal, round, and reactive to light. Conjunctivae and EOM are normal.  Cardiovascular: Normal rate, regular rhythm, normal heart sounds and intact distal pulses.  No murmur heard. Pulmonary/Chest: Effort normal and breath sounds normal. No respiratory distress. She has no wheezes. She  has no rhonchi.  Some upper airway noise. Lower air fields are clear.  Lymphadenopathy:    She has no cervical adenopathy.  Neurological: She is alert and oriented to person, place, and time.  Skin: Skin is warm and dry. No rash noted.  Psychiatric: She has a normal mood and affect. Her behavior is normal.  Vitals reviewed.  Vitals:   08/15/17 1503  BP: (!) 142/80  Pulse: 73  Temp: 98 F (36.7 C)  TempSrc: Oral  SpO2: 98%  Weight: 228 lb 9.6 oz (103.7 kg)  Height: 5' (1.524 m)      Assessment & Plan:    MILLI WOOLRIDGE is a 62 y.o.  female Acute upper respiratory infection  Cough  Bronchospasm - Plan: albuterol (PROVENTIL HFA;VENTOLIN HFA) 108 (90 Base) MCG/ACT inhaler  Suspected viral respiratory infection that overall is improving.  Some reactive airway likely.  Symptomatic care discussed for cough with Mucinex unless that is increasing wheeze.  Albuterol prescribed with instructions on use and RTC precautions if frequent/persistent use or worsening symptoms.  Meds ordered this encounter  Medications  . albuterol (PROVENTIL HFA;VENTOLIN HFA) 108 (90 Base) MCG/ACT inhaler    Sig: Inhale 1-2 puffs into the lungs every 4 (four) hours as needed for wheezing or shortness of breath.    Dispense:  1 Inhaler    Refill:  0   Patient Instructions   Your symptoms appear to be due to a virus, and appear to be improving.  Okay to take Mucinex or Mucinex DM as needed for congestion/cough, but stop that medicine if it worsens wheezing.  See information below on wheezing as well as use of inhaler.  Albuterol inhaler was prescribed, can be used up to every 4 hours as needed, but if you require that more than 2-3 times per day, or persistently need that medicine in the next 3 days, return for recheck.  Return to the clinic or go to the nearest emergency room if any of your symptoms worsen or new symptoms occur.  How to Use a Metered Dose Inhaler A metered dose inhaler is a handheld  device for taking medicine that must be breathed into the lungs (inhaled). The device can be used to deliver a variety of inhaled medicines, including:  Quick relief or rescue medicines, such as bronchodilators.  Controller medicines, such as corticosteroids.  The medicine is delivered by pushing down on a metal canister to release a preset amount of spray and medicine. Each device contains the amount of medicine that is needed for a preset number of uses (inhalations). Your health care provider may recommend that you use a spacer with your inhaler to help you take the medicine more effectively. A spacer is a plastic tube with a mouthpiece on one end and an opening that connects to the inhaler on the other end. A spacer holds the medicine in a tube for a short time, which allows you to inhale more medicine. What are the risks? If you do not use your inhaler correctly, medicine might not reach your lungs to help you breathe. Inhaler medicine can cause side effects, such as:  Mouth or throat infection.  Cough.  Hoarseness.  Headache.  Nausea and vomiting.  Lung infection (pneumonia) in people who have a lung condition called COPD.  How to use a metered dose inhaler without a spacer 1. Remove the cap from the inhaler. 2. If you are using the inhaler for the first time, shake it for 5 seconds, turn it away from your face, then release 4 puffs into the air. This is called priming. 3. Shake the inhaler for 5 seconds. 4. Position the inhaler so the top of the canister faces up. 5. Put your index finger on the top of the medicine canister. Support the bottom of the inhaler with your thumb. 6. Breathe out normally and as completely as possible, away from the inhaler. 7. Either place the inhaler between your teeth and close your lips tightly around the mouthpiece, or hold the inhaler 1-2 inches (2.5-5 cm) away from your open mouth. Keep your tongue down out of the way. If you are unsure which  technique to use, ask your health care  provider. 8. Press the canister down with your index finger to release the medicine, then inhale deeply and slowly through your mouth (not your nose) until your lungs are completely filled. Inhaling should take 4-6 seconds. 9. Hold the medicine in your lungs for 5-10 seconds (10 seconds is best). This helps the medicine get into the small airways of your lungs. 10. With your lips in a tight circle (pursed), breathe out slowly. 11. Repeat steps 3-10 until you have taken the number of puffs that your health care provider directed. Wait about 1 minute between puffs or as directed. 12. Put the cap on the inhaler. 13. If you are using a steroid inhaler, rinse your mouth with water, gargle, and spit out the water. Do not swallow the water. How to use a metered dose inhaler with a spacer 1. Remove the cap from the inhaler. 2. If you are using the inhaler for the first time, shake it for 5 seconds, turn it away from your face, then release 4 puffs into the air. This is called priming. 3. Shake the inhaler for 5 seconds. 4. Place the open end of the spacer onto the inhaler mouthpiece. 5. Position the inhaler so the top of the canister faces up and the spacer mouthpiece faces you. 6. Put your index finger on the top of the medicine canister. Support the bottom of the inhaler and the spacer with your thumb. 7. Breathe out normally and as completely as possible, away from the spacer. 8. Place the spacer between your teeth and close your lips tightly around it. Keep your tongue down out of the way. 9. Press the canister down with your index finger to release the medicine, then inhale deeply and slowly through your mouth (not your nose) until your lungs are completely filled. Inhaling should take 4-6 seconds. 10. Hold the medicine in your lungs for 5-10 seconds (10 seconds is best). This helps the medicine get into the small airways of your lungs. 11. With your lips in a  tight circle (pursed), breathe out slowly. 12. Repeat steps 3-11 until you have taken the number of puffs that your health care provider directed. Wait about 1 minute between puffs or as directed. 13. Remove the spacer from the inhaler and put the cap on the inhaler. 14. If you are using a steroid inhaler, rinse your mouth with water, gargle, and spit out the water. Do not swallow the water. Follow these instructions at home:  Take your inhaled medicine only as told by your health care provider. Do not use the inhaler more than directed by your health care provider.  Keep all follow-up visits as told by your health care provider. This is important.  If your inhaler has a counter, you can check it to determine how full your inhaler is. If your inhaler does not have a counter, ask your health care provider when you will need to refill your inhaler and write the refill date on a calendar or on your inhaler canister. Note that you cannot know when an inhaler is empty by shaking it.  Follow directions on the package insert for care and cleaning of your inhaler and spacer. Contact a health care provider if:  Symptoms are only partially relieved with your inhaler.  You are having trouble using your inhaler.  You have an increase in phlegm.  You have headaches. Get help right away if:  You feel little or no relief after using your inhaler.  You have dizziness.  You  have a fast heart rate.  You have chills or a fever.  You have night sweats.  There is blood in your phlegm. Summary  A metered dose inhaler is a handheld device for taking medicine that must be breathed into the lungs (inhaled).  The medicine is delivered by pushing down on a metal canister to release a preset amount of spray and medicine.  Each device contains the amount of medicine that is needed for a preset number of uses (inhalations). This information is not intended to replace advice given to you by your health  care provider. Make sure you discuss any questions you have with your health care provider. Document Released: 04/26/2005 Document Revised: 03/16/2016 Document Reviewed: 03/16/2016 Elsevier Interactive Patient Education  2017 [employer]  Bronchospasm, Adult Bronchospasm is a tightening of the airways going into the lungs. During an episode, it may be harder to breathe. You may cough, and you may make a whistling sound when you breathe (wheeze). This condition often affects people with asthma. What are the causes? This condition is caused by swelling and irritation in the airways. It can be triggered by:  An infection (common).  Seasonal allergies.  An allergic reaction.  Exercise.  Irritants. These include pollution, cigarette smoke, strong odors, aerosol sprays, and paint fumes.  Weather changes. Winds increase molds and pollens in the air. Cold air may cause swelling.  Stress and emotional upset.  What are the signs or symptoms? Symptoms of this condition include:  Wheezing. If the episode was triggered by an allergy, wheezing may start right away or hours later.  Nighttime coughing.  Frequent or severe coughing with a simple cold.  Chest tightness.  Shortness of breath.  Decreased ability to exercise.  How is this diagnosed? This condition is usually diagnosed with a review of your medical history and a physical exam. Tests, such as lung function tests, are sometimes done to look for other conditions. The need for a chest X-ray depends on where the wheezing occurs and whether it is the first time you have wheezed. How is this treated? This condition may be treated with:  Inhaled medicines. These open up the airways and help you breathe. They can be taken with an inhaler or a nebulizer device.  Corticosteroid medicines. These may be given for severe bronchospasm, usually when it is associated with asthma.  Avoiding triggers, such as irritants, infection, or  allergies.  Follow these instructions at home: Medicines  Take over-the-counter and prescription medicines only as told by your health care provider.  If you need to use an inhaler or nebulizer to take your medicine, ask your health care provider to explain how to use it correctly. If you were given a spacer, always use it with your inhaler. Lifestyle  Reduce the number of triggers in your home. To do this: ? Change your heating and air conditioning filter at least once a month. ? Limit your use of fireplaces and wood stoves. ? Do not smoke. Do not allow smoking in your home. ? Avoid using perfumes and fragrances. ? Get rid of pests, such as roaches and mice, and their droppings. ? Remove any mold from your home. ? Keep your house clean and dust free. Use unscented cleaning products. ? Replace carpet with wood, tile, or vinyl flooring. Carpet can trap dander and dust. ? Use allergy-proof pillows, mattress covers, and box spring covers. ? Wash bed sheets and blankets every week in hot water. Dry them in a dryer. ? Use  blankets that are made of polyester or cotton. ? Wash your hands often. ? Do not allow pets in your bedroom.  Avoid breathing in cold air when you exercise. General instructions  Have a plan for seeking medical care. Know when to call your health care provider and local emergency services, and where to get emergency care.  Stay up to date on your immunizations.  When you have an episode of bronchospasm, stay calm. Try to relax and breathe more slowly.  If you have asthma, make sure you have an asthma action plan.  Keep all follow-up visits as told by your health care provider. This is important. Contact a health care provider if:  You have muscle aches.  You have chest pain.  The mucus that you cough up (sputum) changes from clear or white to yellow, green, gray, or bloody.  You have a fever.  Your sputum gets thicker. Get help right away if:  Your  wheezing and coughing get worse, even after you take your prescribed medicines.  It gets even harder to breathe.  You develop severe chest pain. Summary  Bronchospasm is a tightening of the airways going into the lungs.  During an episode of bronchospasm, you may have a harder time breathing. You may cough and make a whistling sound when you breathe (wheeze).  Avoid exposure to triggers such as smoke, dust, mold, animal dander, and fragrances.  When you have an episode of bronchospasm, stay calm. Try to relax and breathe more slowly. This information is not intended to replace advice given to you by your health care provider. Make sure you discuss any questions you have with your health care provider. Document Released: 04/29/2003 Document Revised: 04/22/2016 Document Reviewed: 04/22/2016 Elsevier Interactive Patient Education  2017 [employer]  Upper Respiratory Infection, Adult Most upper respiratory infections (URIs) are caused by a virus. A URI affects the nose, throat, and upper air passages. The most common type of URI is often called "the common cold." Follow these instructions at home:  Take medicines only as told by your doctor.  Gargle warm saltwater or take cough drops to comfort your throat as told by your doctor.  Use a warm mist humidifier or inhale steam from a shower to increase air moisture. This may make it easier to breathe.  Drink enough fluid to keep your pee (urine) clear or pale yellow.  Eat soups and other clear broths.  Have a healthy diet.  Rest as needed.  Go back to work when your fever is gone or your doctor says it is okay. ? You may need to stay home longer to avoid giving your URI to others. ? You can also wear a face mask and wash your hands often to prevent spread of the virus.  Use your inhaler more if you have asthma.  Do not use any tobacco products, including cigarettes, chewing tobacco, or electronic cigarettes. If you need help  quitting, ask your doctor. Contact a doctor if:  You are getting worse, not better.  Your symptoms are not helped by medicine.  You have chills.  You are getting more short of breath.  You have brown or red mucus.  You have yellow or brown discharge from your nose.  You have pain in your face, especially when you bend forward.  You have a fever.  You have puffy (swollen) neck glands.  You have pain while swallowing.  You have white areas in the back of your throat. Get help right away  if:  You have very bad or constant: ? Headache. ? Ear pain. ? Pain in your forehead, behind your eyes, and over your cheekbones (sinus pain). ? Chest pain.  You have long-lasting (chronic) lung disease and any of the following: ? Wheezing. ? Long-lasting cough. ? Coughing up blood. ? A change in your usual mucus.  You have a stiff neck.  You have changes in your: ? Vision. ? Hearing. ? Thinking. ? Mood. This information is not intended to replace advice given to you by your health care provider. Make sure you discuss any questions you have with your health care provider. Document Released: 10/13/2007 Document Revised: 12/28/2015 Document Reviewed: 08/01/2013 Elsevier Interactive Patient Education  2018 [employer].     IF you received an x-ray today, you will receive an invoice from [facility]. Please contact [facility] at [phone] with questions or concerns regarding your invoice.   IF you received labwork today, you will receive an invoice from [facility]. Please contact [facility] at [phone] with questions or concerns regarding your invoice.   Our billing staff will not be able to assist you with questions regarding bills from these companies.  You will be contacted with the lab results as soon as they are available. The fastest way to get your results is to activate your My Chart account. Instructions are located on the last page of this paperwork.  If you have not heard from [place] regarding the results in 2 weeks, please contact this office.       I personally performed the services described in this documentation, which was scribed in my presence. The recorded information has been reviewed and considered for accuracy and completeness, addended by me as needed, and agree with information above.  Signed,   Merri Ray, MD [facility] [place].  08/17/17 4:02 PM

## 2017-08-17 ENCOUNTER — Encounter: Payer: Self-pay | Admitting: Family Medicine

## 2017-09-01 NOTE — Progress Notes (Signed)
ID: REMEDIOS MCKONE   DOB: 1955/11/11  MR#: [id number]  BPZ#:[id number]  PCP: Marton Redwood, MD GYN: [name], MD SU: Alphonsa Overall, MD OTHER MD: Arloa Koh, MD   CHIEF COMPLAINT:  Left Breast Cancer  CURRENT TREATMENT: anastrozole   HISTORY OF PRESENT ILLNESS: [name] is a  woman referred by [name] for treatment of left breast cancer.   The patient underwent screening bilateral mammography July 04, 2009, at [facility] and this showed only a scattered fibroglandular densities. However, repeat screening exam Sep 29, 2010 showed a potential abnormality in the left breast, retroareolar region. The patient was recalled for additional views Oct 08, 2010 and Dr. [name] was able to locate the nodular density noted on the screening exam. It measured approximately 8 mm. Ultrasound showed a second nodular density just lateral to the nipple areolar complex. In that area, there was what appears to be either a small cluster of cysts or perhaps a solid nodule. In particular, the nodule seen in the 3 o'clock position was felt to be most likely a fibroadenoma. The other area, however, required biopsy and this was performed October 26, 2010. The pathology from this procedure ([id number]) showed a high-grade invasive ductal carcinoma which was estrogen-receptor positive at 98%, progesterone-receptor positive at 13% with an elevated MIB-1 at 69% and no evidence of HER-2 amplification, the ratio by CISH being 1.32.   With this information the patient was referred for bilateral breast MRIs and these were performed October 30, 2010. In the left breast there was a 1.8 cm ill-defined area in the central breast associated with post biopsy changes. There were really no other suspicious areas in either breast and no bony abnormalities and no abnormal appearing or enlarged lymph nodes.   Patient underwent left lumpectomy and sentinel lymph node sampling December 10, 2010 ([id number]) for a 1.1-cm invasive ductal carcinoma,  grade 3, with 0/2 sentinel lymph nodes involved and so T1c N0 (stage I); the tumor being estrogen receptor 98% and progesterone receptor 13% positive, with no HER-2 amplification (by initial determination, but amplified on repeat with a ratio of 2.30); with an Oncotype DX score of 43 predicting a 29% risk of recurrence if she takes 5 years of tamoxifen. (Her-2 was negative on the Oncotype DX determination, which uses a completely different method).   Her subsequent history is as detailed below.   INTERVAL HISTORY: [name] returns today for evaluation and treatment of recurrent left breast cancer. She continues on anastrozole, with good tolerance. She has occasional hot flashes at night. She has a fan to keep her cool. She denies issues with vaginal dryness.   At the last visit we discussed the need for mastectomy and she was supposed to have had this procedure March of this year.  However, she plans on having a mammogram soon. She will make a decision based on the mammogram results, she says.    REVIEW OF SYSTEMS: [name] reports that she has gained at least 8 lbs. She feels that she is swelling all over her body. Her diet consists of fish 4 times per week. She also eats salads, avocado, and baked potato. She tries to go to the gym 3 times per week, but lately it has been 1-2 times. She has been certified to take an ageless grace class to increase mindful meditation and movements. She has pain in her right ankle. She denies unusual headaches, visual changes, nausea, vomiting, or dizziness. There has been no unusual cough, phlegm production, or pleurisy. This  been no change in bowel or bladder habits. She denies unexplained fatigue or unexplained weight loss, bleeding, rash, or fever. A detailed review of systems was otherwise stable.    PAST MEDICAL HISTORY: Past Medical History:  Diagnosis Date  . Breast cancer ([place])    stage I high grade invasive ductal ca left breast  . Breast lump    left  .  Cancer ([place])   . Diabetes mellitus    borderline  . Gum disease   . Hypertension   . Sleep apnea     PAST SURGICAL HISTORY: Past Surgical History:  Procedure Laterality Date  . BREAST LUMPECTOMY  2012   left  . FOOT MASS EXCISION    . LAPAROSCOPIC GASTRIC BANDING    . MASS EXCISION     back  . PORTACATH PLACEMENT  02/11/11  . PORTACATH REMOVAL  MARCH 2014  . RESECTOSCOPIC POLYPECTOMY  2001   MYOMECTOMY  . SOFT TISSUE CYST EXCISION     back cyst  . VAGINAL MASS EXCISION      FAMILY HISTORY Family History  Problem Relation Age of Onset  . Cancer Maternal Uncle 70       prostate  . Alcohol abuse Father   . Cirrhosis Father   . Cancer Mother        breast    GYNECOLOGIC HISTORY: ((Updated 01/26/2013) She had menarche at age 67. She is GX P0.  Last menstrual period was late May 2012.  She has never used hormone replacement.    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 She attends a [employer]. She goes to the gym at least three times a week.    ADVANCED DIRECTIVES:Not in place  HEALTH MAINTENANCE: Social History   Tobacco Use  . Smoking status: Former Smoker    Packs/day: 1.00    Years: 21.00    Pack years: 21.00    Types: Cigarettes    Last attempt to quit: 03/25/1999    Years since quitting: 18.4  . Smokeless tobacco: Never Used  Substance Use Topics  . Alcohol use: No    Alcohol/week: 0.0 oz  . Drug use: No     Colonoscopy:   PAP: May 2014, Dr. Toney Rakes  Bone density: March 2011, Normal  Lipid panel:  UTD, [name]  No Known Allergies  Current Outpatient Medications  Medication Sig Dispense Refill  . albuterol (PROVENTIL HFA;VENTOLIN HFA) 108 (90 Base) MCG/ACT inhaler Inhale 1-2 puffs into the lungs every 4 (four) hours as needed for wheezing or shortness of breath. 1 Inhaler 0  . amLODipine-olmesartan (AZOR) 10-40 MG per tablet Take 1 tablet by mouth daily. 30 tablet 6  .  anastrozole (ARIMIDEX) 1 MG tablet Take 1 tablet (1 mg total) by mouth daily. 90 tablet 4  . Multiple Vitamins-Minerals (ALIVE ONCE DAILY WOMENS PO) Take by mouth.     No current facility-administered medications for this visit.    OBJECTIVE: Middle-aged [place] American woman who appears well  Vitals:   09/02/17 0850  BP: 131/67  Pulse: 74  Resp: 17  Temp: 98.5 F (36.9 C)  SpO2: 100%     Body mass index is 44.94 kg/m.    ECOG FS: 1 Filed Weights   09/02/17 0850  Weight: 230 lb 1.6 oz (104.4 kg)   Filed Weights   09/02/17 0850  Weight: 230 lb 1.6 oz (104.4 kg)   Sclerae unicteric, EOMs intact Oropharynx clear and moist No cervical  or supraclavicular adenopathy Lungs no rales or rhonchi Heart regular rate and rhythm Abd soft, nontender, positive bowel sounds MSK no focal spinal tenderness, no upper extremity lymphedema Neuro: nonfocal, well oriented, appropriate affect Breasts: Right breast is unremarkable.  The left breast is status post lumpectomy and radiation.  There is some distortion of the breast contour.  There is no evidence of disease recurrence.  Both axillae are benign  LAB RESULTS: Lab Results  Component Value Date   WBC 4.6 04/22/2017   NEUTROABS 2.7 04/22/2017   HGB 12.2 04/22/2017   HCT 38.1 04/22/2017   MCV 84.3 04/22/2017   PLT 269 04/22/2017      Chemistry      Component Value Date/Time   NA 140 04/22/2017 1327   K 4.0 04/22/2017 1327   CL 105 09/08/2012 1320   CO2 25 04/22/2017 1327   BUN 15.0 04/22/2017 1327   CREATININE 0.8 04/22/2017 1327      Component Value Date/Time   CALCIUM 9.0 04/22/2017 1327   ALKPHOS 58 04/22/2017 1327   AST 13 04/22/2017 1327   ALT 11 04/22/2017 1327   BILITOT 0.38 04/22/2017 1327       STUDIES: Repeat left mammogram and ultrasound due  ASSESSMENT: 62 y.o. [place] woman   1. Status post left lumpectomy and sentinel lymph node sampling August 2012 for a T1cN0, stage IA invasive ductal carcinoma  of overlapping sites, grade 3, estrogen receptor 98% and progesterone receptor 13% positive, HER-2/neu amplified with a ratio of 2.3.   2. Oncotype showed a recurrence score of 43 ( "high risk"), predicting a 29% risk of recurrence if the patient's only adjuvant treatment was tamoxifen   3. received adjuvant docetaxel/ cyclophosphamide x4, completed December 2012   4. trastuzumab started 04/15/2011, completed 05/05/2012; final echo 02/18/2012  5. radiation therapy, completed July 23, 2011  6. began on tamoxifen March 2013, but never taken consistently   RECURRENT DISEASE: November 2018 7.  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  Anastrozole started 04/22/2017  9.  Definitive surgery pending  PLAN: [name] is tolerating the anastrozole well.  She has been on it a little over 4 months, and it is time to find out whether it is controlling her disease in the left breast.  Accordingly I am setting her up for mammography and ultrasonography of the left breast within the week at [facility].  After that she would like to meet with Dr. Lucia Gaskins to discuss not only the possibility of mastectomy, or perhaps even lumpectomy, although that would not be standard, but also whether she needs to have her lap band revised since she continues to gain weight  Today we discussed eliminating carbohydrates from her diet.  I also suggested she increase her visits to the gym to 4 times a week instead of twice  She is going to return to see me in 3 months.  She knows to call for any problems that may develop before the next visit.   [name], Virgie Dad, MD  09/02/17 9:11 AM Medical Oncology and Hematology [facility] [address] [place], [place] [address] Tel. [phone]    Fax. [phone]  This document serves as a record of services personally performed by Lurline Del, MD. It was created on his behalf by  Sheron Nightingale, a trained medical scribe. The creation of this record is based on the scribe's personal observations and the provider's statements to them.  I have reviewed the above documentation for accuracy and completeness, and I agree with the above.

## 2017-09-02 ENCOUNTER — Telehealth: Payer: Self-pay | Admitting: Oncology

## 2017-09-02 ENCOUNTER — Inpatient Hospital Stay: Payer: BC Managed Care – PPO | Attending: Oncology | Admitting: Oncology

## 2017-09-02 VITALS — BP 131/67 | HR 74 | Temp 98.5°F | Resp 17 | Ht 60.0 in | Wt 230.1 lb

## 2017-09-02 DIAGNOSIS — Z853 Personal history of malignant neoplasm of breast: Secondary | ICD-10-CM | POA: Diagnosis not present

## 2017-09-02 DIAGNOSIS — Z79899 Other long term (current) drug therapy: Secondary | ICD-10-CM | POA: Insufficient documentation

## 2017-09-02 DIAGNOSIS — E119 Type 2 diabetes mellitus without complications: Secondary | ICD-10-CM

## 2017-09-02 DIAGNOSIS — Z803 Family history of malignant neoplasm of breast: Secondary | ICD-10-CM | POA: Insufficient documentation

## 2017-09-02 DIAGNOSIS — Z9221 Personal history of antineoplastic chemotherapy: Secondary | ICD-10-CM | POA: Insufficient documentation

## 2017-09-02 DIAGNOSIS — Z79811 Long term (current) use of aromatase inhibitors: Secondary | ICD-10-CM

## 2017-09-02 DIAGNOSIS — Z170 Estrogen receptor positive status [ER+]: Secondary | ICD-10-CM | POA: Diagnosis not present

## 2017-09-02 DIAGNOSIS — I10 Essential (primary) hypertension: Secondary | ICD-10-CM | POA: Diagnosis not present

## 2017-09-02 DIAGNOSIS — Z8042 Family history of malignant neoplasm of prostate: Secondary | ICD-10-CM | POA: Insufficient documentation

## 2017-09-02 DIAGNOSIS — Z9225 Personal history of immunosupression therapy: Secondary | ICD-10-CM | POA: Insufficient documentation

## 2017-09-02 DIAGNOSIS — R601 Generalized edema: Secondary | ICD-10-CM | POA: Diagnosis not present

## 2017-09-02 DIAGNOSIS — M25571 Pain in right ankle and joints of right foot: Secondary | ICD-10-CM | POA: Diagnosis not present

## 2017-09-02 DIAGNOSIS — D0512 Intraductal carcinoma in situ of left breast: Secondary | ICD-10-CM | POA: Diagnosis not present

## 2017-09-02 DIAGNOSIS — Z87891 Personal history of nicotine dependence: Secondary | ICD-10-CM | POA: Diagnosis not present

## 2017-09-02 DIAGNOSIS — C50912 Malignant neoplasm of unspecified site of left female breast: Secondary | ICD-10-CM

## 2017-09-02 DIAGNOSIS — C50812 Malignant neoplasm of overlapping sites of left female breast: Secondary | ICD-10-CM

## 2017-09-02 DIAGNOSIS — Z923 Personal history of irradiation: Secondary | ICD-10-CM | POA: Diagnosis not present

## 2017-09-02 DIAGNOSIS — G4730 Sleep apnea, unspecified: Secondary | ICD-10-CM | POA: Insufficient documentation

## 2017-09-02 NOTE — Telephone Encounter (Signed)
Gave avs and calendar patient requested dates she will be out of town

## 2017-10-01 ENCOUNTER — Other Ambulatory Visit: Payer: Self-pay | Admitting: Surgery

## 2017-10-05 ENCOUNTER — Other Ambulatory Visit: Payer: Self-pay | Admitting: Surgery

## 2017-10-05 DIAGNOSIS — Z9884 Bariatric surgery status: Secondary | ICD-10-CM

## 2017-10-25 ENCOUNTER — Other Ambulatory Visit: Payer: Self-pay | Admitting: Surgery

## 2017-10-25 ENCOUNTER — Ambulatory Visit
Admission: RE | Admit: 2017-10-25 | Discharge: 2017-10-25 | Disposition: A | Discharge status: Home / Self Care | Payer: BC Managed Care – PPO | Source: Ambulatory Visit | Attending: Surgery | Admitting: Surgery

## 2017-10-25 DIAGNOSIS — Z9884 Bariatric surgery status: Secondary | ICD-10-CM

## 2017-11-28 ENCOUNTER — Ambulatory Visit: Payer: Self-pay | Admitting: Surgery

## 2017-12-06 ENCOUNTER — Inpatient Hospital Stay: Payer: BC Managed Care – PPO

## 2017-12-06 ENCOUNTER — Telehealth: Payer: Self-pay | Admitting: Adult Health

## 2017-12-06 ENCOUNTER — Encounter: Payer: Self-pay | Admitting: Adult Health

## 2017-12-06 ENCOUNTER — Inpatient Hospital Stay: Payer: BC Managed Care – PPO | Attending: Oncology | Admitting: Adult Health

## 2017-12-06 VITALS — BP 131/74 | HR 66 | Temp 98.7°F | Resp 18 | Ht 60.0 in | Wt 218.9 lb

## 2017-12-06 DIAGNOSIS — E119 Type 2 diabetes mellitus without complications: Secondary | ICD-10-CM | POA: Insufficient documentation

## 2017-12-06 DIAGNOSIS — Z87891 Personal history of nicotine dependence: Secondary | ICD-10-CM | POA: Insufficient documentation

## 2017-12-06 DIAGNOSIS — Z923 Personal history of irradiation: Secondary | ICD-10-CM | POA: Diagnosis not present

## 2017-12-06 DIAGNOSIS — G4730 Sleep apnea, unspecified: Secondary | ICD-10-CM

## 2017-12-06 DIAGNOSIS — Z170 Estrogen receptor positive status [ER+]: Secondary | ICD-10-CM

## 2017-12-06 DIAGNOSIS — I10 Essential (primary) hypertension: Secondary | ICD-10-CM | POA: Diagnosis not present

## 2017-12-06 DIAGNOSIS — C50812 Malignant neoplasm of overlapping sites of left female breast: Secondary | ICD-10-CM

## 2017-12-06 DIAGNOSIS — Z79811 Long term (current) use of aromatase inhibitors: Secondary | ICD-10-CM | POA: Insufficient documentation

## 2017-12-06 DIAGNOSIS — Z79899 Other long term (current) drug therapy: Secondary | ICD-10-CM

## 2017-12-06 DIAGNOSIS — D119 Benign neoplasm of major salivary gland, unspecified: Secondary | ICD-10-CM

## 2017-12-06 LAB — CBC WITH DIFFERENTIAL/PLATELET
Basophils Absolute: 0 10*3/uL (ref 0.0–0.1)
Basophils Relative: 1 %
EOS ABS: 0 10*3/uL (ref 0.0–0.5)
Eosinophils Relative: 1 %
HEMATOCRIT: 38 % (ref 34.8–46.6)
HEMOGLOBIN: 12.2 g/dL (ref 11.6–15.9)
LYMPHS ABS: 1.2 10*3/uL (ref 0.9–3.3)
LYMPHS PCT: 30 %
MCH: 26.7 pg (ref 25.1–34.0)
MCHC: 32.1 g/dL (ref 31.5–36.0)
MCV: 83.3 fL (ref 79.5–101.0)
Monocytes Absolute: 0.3 10*3/uL (ref 0.1–0.9)
Monocytes Relative: 8 %
NEUTROS ABS: 2.3 10*3/uL (ref 1.5–6.5)
NEUTROS PCT: 60 %
Platelets: 274 10*3/uL (ref 145–400)
RBC: 4.57 MIL/uL (ref 3.70–5.45)
RDW: 15.1 % — ABNORMAL HIGH (ref 11.2–14.5)
WBC: 3.9 10*3/uL (ref 3.9–10.3)

## 2017-12-06 LAB — COMPREHENSIVE METABOLIC PANEL
ALBUMIN: 3.7 g/dL (ref 3.5–5.0)
ALK PHOS: 68 U/L (ref 38–126)
ALT: 11 U/L (ref 0–44)
AST: 14 U/L — ABNORMAL LOW (ref 15–41)
Anion gap: 8 (ref 5–15)
BILIRUBIN TOTAL: 0.5 mg/dL (ref 0.3–1.2)
BUN: 11 mg/dL (ref 6–20)
CALCIUM: 9.1 mg/dL (ref 8.9–10.3)
CO2: 28 mmol/L (ref 22–32)
CREATININE: 0.82 mg/dL (ref 0.44–1.00)
Chloride: 106 mmol/L (ref 98–111)
GFR calc non Af Amer: >60 mL/min (ref >60)
GLUCOSE: 89 mg/dL (ref 70–99)
Potassium: 3.9 mmol/L (ref 3.5–5.1)
SODIUM: 142 mmol/L (ref 135–145)
TOTAL PROTEIN: 7.2 g/dL (ref 6.5–8.1)

## 2017-12-06 NOTE — Progress Notes (Signed)
ID: JOELLE ROSWELL   DOB: 1956/01/06  MR#: [id number]  HYW#:[id number]  PCP: Marton Redwood, MD GYN: [name], MD SU: Alphonsa Overall, MD OTHER MD: Arloa Koh, MD   CHIEF COMPLAINT:  Left Breast Cancer  CURRENT TREATMENT: anastrozole   HISTORY OF PRESENT ILLNESS: [name] is a  woman referred by [name] for treatment of left breast cancer.   The patient underwent screening bilateral mammography July 04, 2009, at [facility] and this showed only a scattered fibroglandular densities. However, repeat screening exam Sep 29, 2010 showed a potential abnormality in the left breast, retroareolar region. The patient was recalled for additional views Oct 08, 2010 and Dr. [name] was able to locate the nodular density noted on the screening exam. It measured approximately 8 mm. Ultrasound showed a second nodular density just lateral to the nipple areolar complex. In that area, there was what appears to be either a small cluster of cysts or perhaps a solid nodule. In particular, the nodule seen in the 3 o'clock position was felt to be most likely a fibroadenoma. The other area, however, required biopsy and this was performed October 26, 2010. The pathology from this procedure ([id number]) showed a high-grade invasive ductal carcinoma which was estrogen-receptor positive at 98%, progesterone-receptor positive at 13% with an elevated MIB-1 at 69% and no evidence of HER-2 amplification, the ratio by CISH being 1.32.   With this information the patient was referred for bilateral breast MRIs and these were performed October 30, 2010. In the left breast there was a 1.8 cm ill-defined area in the central breast associated with post biopsy changes. There were really no other suspicious areas in either breast and no bony abnormalities and no abnormal appearing or enlarged lymph nodes.   Patient underwent left lumpectomy and sentinel lymph node sampling December 10, 2010 ([id number]) for a 1.1-cm invasive ductal carcinoma,  grade 3, with 0/2 sentinel lymph nodes involved and so T1c N0 (stage I); the tumor being estrogen receptor 98% and progesterone receptor 13% positive, with no HER-2 amplification (by initial determination, but amplified on repeat with a ratio of 2.30); with an Oncotype DX score of 43 predicting a 29% risk of recurrence if she takes 5 years of tamoxifen. (Her-2 was negative on the Oncotype DX determination, which uses a completely different method).   Her subsequent history is as detailed below.   INTERVAL HISTORY: Lashante returns today for evaluation and treatment of recurrent left breast cancer. She continues on anastrozole, with good tolerance.  She underwent her mammogram in May of this year and she notes she was pleased with the results.  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 She decided to hold off on surgery.  She has been having mammograms every 6 months while on this therapy.     REVIEW OF SYSTEMS: [name] continues to take the Anastrozole.  She has stopped taking the anastrozole for the past two weeks.  Instead she has been taking black seed which is supposed to help eliminate growth in her body.  She is planning on going back to the anastrozole.  She has had some mild vaginal dryness, but denies arthralgias.  She is walking.  She says she sees her PCP regularly and is up to date with her other cancer screenings.  A detailed ROS was conducted and was otherwise non contributory.    PAST MEDICAL HISTORY: Past Medical History:  Diagnosis Date  . Breast cancer ([place])  stage I high grade invasive ductal ca left breast  . Breast lump    left  . Cancer ([place])   . Diabetes mellitus    borderline  . Gum disease   . Hypertension   . Sleep apnea     PAST SURGICAL HISTORY: Past Surgical History:  Procedure Laterality Date  . BREAST LUMPECTOMY  2012   left  . FOOT MASS EXCISION    . LAPAROSCOPIC GASTRIC BANDING    . MASS EXCISION     back   . PORTACATH PLACEMENT  02/11/11  . PORTACATH REMOVAL  MARCH 2014  . RESECTOSCOPIC POLYPECTOMY  2001   MYOMECTOMY  . SOFT TISSUE CYST EXCISION     back cyst  . VAGINAL MASS EXCISION      FAMILY HISTORY Family History  Problem Relation Age of Onset  . Cancer Maternal Uncle 70       prostate  . Alcohol abuse Father   . Cirrhosis Father   . Cancer Mother        breast    GYNECOLOGIC HISTORY: ((Updated 01/26/2013) She had menarche at age 36. She is GX P0.  Last menstrual period was late May 2012.  She has never used hormone replacement.    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 She attends a [employer]. She goes to the gym at least three times a week.    ADVANCED DIRECTIVES:Not in place  HEALTH MAINTENANCE: Social History   Tobacco Use  . Smoking status: Former Smoker    Packs/day: 1.00    Years: 21.00    Pack years: 21.00    Types: Cigarettes    Last attempt to quit: 03/25/1999    Years since quitting: 18.7  . Smokeless tobacco: Never Used  Substance Use Topics  . Alcohol use: No  . Drug use: No     Colonoscopy:   PAP: May 2014, Dr. Toney Rakes  Bone density: March 2011, Normal  Lipid panel:  UTD, [name]  No Known Allergies  Current Outpatient Medications  Medication Sig Dispense Refill  . albuterol (PROVENTIL HFA;VENTOLIN HFA) 108 (90 Base) MCG/ACT inhaler Inhale 1-2 puffs into the lungs every 4 (four) hours as needed for wheezing or shortness of breath. 1 Inhaler 0  . amLODipine-olmesartan (AZOR) 10-40 MG per tablet Take 1 tablet by mouth daily. 30 tablet 6  . anastrozole (ARIMIDEX) 1 MG tablet Take 1 tablet (1 mg total) by mouth daily. 90 tablet 4  . Multiple Vitamins-Minerals (ALIVE ONCE DAILY WOMENS PO) Take by mouth.     No current facility-administered medications for this visit.    OBJECTIVE:   Vitals:   12/06/17 1033  BP: 131/74  Pulse: 66  Resp: 18  Temp: 98.7 F (37.1 C)   SpO2: 100%     Body mass index is 42.75 kg/m.    ECOG FS: 1 Filed Weights   12/06/17 1033  Weight: 218 lb 14.4 oz (99.3 kg)   Filed Weights   12/06/17 1033  Weight: 218 lb 14.4 oz (99.3 kg)   GENERAL: Patient is a well appearing female in no acute distress HEENT:  Sclerae anicteric.  Oropharynx clear and moist. No ulcerations or evidence of oropharyngeal candidiasis. Neck is supple.  NODES:  No cervical, supraclavicular, or axillary lymphadenopathy palpated.  BREAST EXAM:  Right breast is without nodules, masses, skin or nipple changes, left breast s/p lumpectomy with mild amt of distortion and mild amt of scar tissue present.  No nodules or masses.   LUNGS:  Clear to auscultation bilaterally.  No wheezes or rhonchi. HEART:  Regular rate and rhythm. No murmur appreciated. ABDOMEN:  Soft, nontender.  Positive, normoactive bowel sounds. No organomegaly palpated. MSK:  No focal spinal tenderness to palpation. Full range of motion bilaterally in the upper extremities. EXTREMITIES:  No peripheral edema.   SKIN:  Clear with no obvious rashes or skin changes. No nail dyscrasia. NEURO:  Nonfocal. Well oriented.  Appropriate affect.    LAB RESULTS: Lab Results  Component Value Date   WBC 3.9 12/06/2017   NEUTROABS 2.3 12/06/2017   HGB 12.2 12/06/2017   HCT 38.0 12/06/2017   MCV 83.3 12/06/2017   PLT 274 12/06/2017      Chemistry      Component Value Date/Time   NA 142 12/06/2017 1015   NA 140 04/22/2017 1327   K 3.9 12/06/2017 1015   K 4.0 04/22/2017 1327   CL 106 12/06/2017 1015   CL 105 09/08/2012 1320   CO2 28 12/06/2017 1015   CO2 25 04/22/2017 1327   BUN 11 12/06/2017 1015   BUN 15.0 04/22/2017 1327   CREATININE 0.82 12/06/2017 1015   CREATININE 0.8 04/22/2017 1327      Component Value Date/Time   CALCIUM 9.1 12/06/2017 1015   CALCIUM 9.0 04/22/2017 1327   ALKPHOS 68 12/06/2017 1015   ALKPHOS 58 04/22/2017 1327   AST 14 (L) 12/06/2017 1015   AST 13 04/22/2017  1327   ALT 11 12/06/2017 1015   ALT 11 04/22/2017 1327   BILITOT 0.5 12/06/2017 1015   BILITOT 0.38 04/22/2017 1327       STUDIES: Repeat left mammogram and ultrasound due  ASSESSMENT: 62 y.o. [place] woman   1. Status post left lumpectomy and sentinel lymph node sampling August 2012 for a T1cN0, stage IA invasive ductal carcinoma of overlapping sites, grade 3, estrogen receptor 98% and progesterone receptor 13% positive, HER-2/neu amplified with a ratio of 2.3.   2. Oncotype showed a recurrence score of 43 ( "high risk"), predicting a 29% risk of recurrence if the patient's only adjuvant treatment was tamoxifen   3. received adjuvant docetaxel/ cyclophosphamide x4, completed December 2012   4. trastuzumab started 04/15/2011, completed 05/05/2012; final echo 02/18/2012  5. radiation therapy, completed July 23, 2011  6. began on tamoxifen March 2013, but never taken consistently   RECURRENT DISEASE: November 2018 7.  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  Anastrozole started 04/22/2017  9.  Definitive surgery pending  PLAN: Torsha is doing well today.  She underwent a mammogram and ultrasound at [facility] and we unfortunately do not have these records.  We have requested these today.  She will be due for mammogram and repeat ultrasound, I believe in November.  I will know more once I receive her mammogram records and will order these accordingly. She will tentatively see Dr. Jana Hakim after her mammogram.  I have put her on the schedule for January, but that may very well change once I review her mammogram and ultrasound reports with him when he returns from the beach next week.    I did review bone health with Chelli today.  She will be due for repeat bone density in 04/2018.  I gave her a handout on bone heatlh.  I encouraged continued healthy living with healthy diet, exercise, and following with  her PCP for her cancer screenings.  Lynlee knows to call for any questions or concerns in the interim.    A total of (30) minutes of face-to-face time was spent with this patient with greater than 50% of that time in counseling and care-coordination.   Wilber Bihari, NP  12/06/17 10:56 AM Medical Oncology and Hematology [facility] [address] [place],  [address] Tel. [phone]    Fax. [phone]

## 2017-12-06 NOTE — Telephone Encounter (Signed)
Gave patient avs and calendar of upcoming jan appts. °

## 2017-12-06 NOTE — Patient Instructions (Signed)
Bone Health Bones protect organs, store calcium, and anchor muscles. Good health habits, such as eating nutritious foods and exercising regularly, are important for maintaining healthy bones. They can also help to prevent a condition that causes bones to lose density and become weak and brittle (osteoporosis). Why is bone mass important? Bone mass refers to the amount of bone tissue that you have. The higher your bone mass, the stronger your bones. An important step toward having healthy bones throughout life is to have strong and dense bones during childhood. A young adult who has a high bone mass is more likely to have a high bone mass later in life. Bone mass at its greatest it is called peak bone mass. A large decline in bone mass occurs in older adults. In women, it occurs about the time of menopause. During this time, it is important to practice good health habits, because if more bone is lost than what is replaced, the bones will become less healthy and more likely to break (fracture). If you find that you have a low bone mass, you may be able to prevent osteoporosis or further bone loss by changing your diet and lifestyle. How can I find out if my bone mass is low? Bone mass can be measured with an X-ray test that is called a bone mineral density (BMD) test. This test is recommended for all women who are age 65 or older. It may also be recommended for men who are age 70 or older, or for people who are more likely to develop osteoporosis due to:  Having bones that break easily.  Having a long-term disease that weakens bones, such as kidney disease or rheumatoid arthritis.  Having menopause earlier than normal.  Taking medicine that weakens bones, such as steroids, thyroid hormones, or hormone treatment for breast cancer or prostate cancer.  Smoking.  Drinking three or more alcoholic drinks each day.  What are the nutritional recommendations for healthy bones? To have healthy bones, you  need to get enough of the right minerals and vitamins. Most nutrition experts recommend getting these nutrients from the foods that you eat. Nutritional recommendations vary from person to person. Ask your health care provider what is healthy for you. Here are some general guidelines. Calcium Recommendations Calcium is the most important (essential) mineral for bone health. Most people can get enough calcium from their diet, but supplements may be recommended for people who are at risk for osteoporosis. Good sources of calcium include:  Dairy products, such as low-fat or nonfat milk, cheese, and yogurt.  Dark green leafy vegetables, such as bok choy and broccoli.  Calcium-fortified foods, such as orange juice, cereal, bread, soy beverages, and tofu products.  Nuts, such as almonds.  Follow these recommended amounts for daily calcium intake:  Children, age 1?3: 700 mg.  Children, age 4?8: 1,000 mg.  Children, age 9?13: 1,300 mg.  Teens, age 14?18: 1,300 mg.  Adults, age 19?50: 1,000 mg.  Adults, age 51?70: ? Men: 1,000 mg. ? Women: 1,200 mg.  Adults, age 71 or older: 1,200 mg.  Pregnant and breastfeeding females: ? Teens: 1,300 mg. ? Adults: 1,000 mg.  Vitamin D Recommendations Vitamin D is the most essential vitamin for bone health. It helps the body to absorb calcium. Sunlight stimulates the skin to make vitamin D, so be sure to get enough sunlight. If you live in a cold climate or you do not get outside often, your health care provider may recommend that you take vitamin   D supplements. Good sources of vitamin D in your diet include:  Egg yolks.  Saltwater fish.  Milk and cereal fortified with vitamin D.  Follow these recommended amounts for daily vitamin D intake:  Children and teens, age 1?18: 600 international units.  Adults, age 50 or younger: 400-800 international units.  Adults, age 51 or older: 800-1,000 international units.  Other Nutrients Other nutrients  for bone health include:  Phosphorus. This mineral is found in meat, poultry, dairy foods, nuts, and legumes. The recommended daily intake for adult men and adult women is 700 mg.  Magnesium. This mineral is found in seeds, nuts, dark green vegetables, and legumes. The recommended daily intake for adult men is 400?420 mg. For adult women, it is 310?320 mg.  Vitamin K. This vitamin is found in green leafy vegetables. The recommended daily intake is 120 mg for adult men and 90 mg for adult women.  What type of physical activity is best for building and maintaining healthy bones? Weight-bearing and strength-building activities are important for building and maintaining peak bone mass. Weight-bearing activities cause muscles and bones to work against gravity. Strength-building activities increases muscle strength that supports bones. Weight-bearing and muscle-building activities include:  Walking and hiking.  Jogging and running.  Dancing.  Gym exercises.  Lifting weights.  Tennis and racquetball.  Climbing stairs.  Aerobics.  Adults should get at least 30 minutes of moderate physical activity on most days. Children should get at least 60 minutes of moderate physical activity on most days. Ask your health care provide what type of exercise is best for you. Where can I find more information? For more information, check out the following websites:  National Osteoporosis Foundation: http://nof.org/learn/basics  National Institutes of Health: http://www.niams.nih.gov/Health_Info/Bone/Bone_Health/bone_health_for_life.asp  This information is not intended to replace advice given to you by your health care provider. Make sure you discuss any questions you have with your health care provider. Document Released: 07/17/2003 Document Revised: 11/14/2015 Document Reviewed: 05/01/2014 Elsevier Interactive Patient Education  2018 Elsevier Inc.  

## 2017-12-07 ENCOUNTER — Other Ambulatory Visit: Payer: Self-pay | Admitting: Adult Health

## 2017-12-07 DIAGNOSIS — C50912 Malignant neoplasm of unspecified site of left female breast: Secondary | ICD-10-CM

## 2017-12-07 NOTE — Progress Notes (Signed)
Received mammo results form 09/13/2017.  Will order repeat mammo for 03/2018.

## 2017-12-26 ENCOUNTER — Telehealth: Payer: Self-pay | Admitting: Oncology

## 2017-12-26 NOTE — Telephone Encounter (Signed)
Faxed medical records to [facility] at [phone], Release ID: [id number]

## 2018-03-20 ENCOUNTER — Encounter: Payer: Self-pay | Admitting: Oncology

## 2018-03-20 NOTE — Progress Notes (Signed)
No show

## 2018-03-22 ENCOUNTER — Encounter: Payer: Self-pay | Admitting: Oncology

## 2018-03-22 ENCOUNTER — Inpatient Hospital Stay: Payer: BC Managed Care – PPO | Attending: Oncology | Admitting: Oncology

## 2018-05-16 NOTE — Progress Notes (Signed)
[place]  [phone] [phone]   ID: [name]   DOB: November 04, 1955  MR#: [id number]  ONG#:[id number]  Patient Care Team: Marton Redwood, MD as PCP - General (Internal Medicine) Magrinat, Virgie Dad, MD as Attending Physician (Hematology and Oncology) Arloa Koh, MD as Attending Physician (Radiation Oncology) Alphonsa Overall, MD as Consulting Physician (General Surgery)  OTHER MD: [name], MD (GYN)   CHIEF COMPLAINT:  Left Breast Cancer  CURRENT TREATMENT: to start exemestane   INTERVAL HISTORY: [name] returns today for follow-up and treatment of of her recurrent left breast cancer.   The patient continues on anastrozole. She states that she feels myalgias, primarily in her shins bilaterally. She does wear very comfortable shoes. She does have hot flashes, which she moderates at night with a fan. She does experience vaginal dryness.   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 At the [facility] mammogram in May this was felt to be slightly improved. Since her last visit here, she underwent mammography at [facility] in 03/2018.  She was called in by the mammography and told that the earlier reading was not accurate and that the tumor had clearly grown at least a 611 inch  [name]'s last bone density screening on 04/22/2016 at [facility], showed a T-score of -1.0, which is considered normal. She is overdue for a bone density screening.    REVIEW OF SYSTEMS: Terris spend a lot of time with her family for the holidays . For exercise, she goes to the gym, usually about two or three days per week. The patient denies unusual headaches, visual changes, nausea, vomiting, or dizziness. There has been no unusual cough, phlegm production, or pleurisy. This been no change in bowel or bladder habits. The patient denies unexplained fatigue or unexplained weight loss, bleeding, rash, or fever. A detailed review of systems was  otherwise noncontributory.     HISTORY OF PRESENT ILLNESS: [name] was referred by [name] for treatment of left breast cancer.   The patient underwent screening bilateral mammography July 04, 2009, at [facility] and this showed only a scattered fibroglandular densities. However, repeat screening exam Sep 29, 2010 showed a potential abnormality in the left breast, retroareolar region. The patient was recalled for additional views Oct 08, 2010 and Dr. [name] was able to locate the nodular density noted on the screening exam. It measured approximately 8 mm. Ultrasound showed a second nodular density just lateral to the nipple areolar complex. In that area, there was what appears to be either a small cluster of cysts or perhaps a solid nodule. In particular, the nodule seen in the 3 o'clock position was felt to be most likely a fibroadenoma. The other area, however, required biopsy and this was performed October 26, 2010. The pathology from this procedure ([id number]) showed a high-grade invasive ductal carcinoma which was estrogen-receptor positive at 98%, progesterone-receptor positive at 13% with an elevated MIB-1 at 69% and no evidence of HER-2 amplification, the ratio by CISH being 1.32.   With this information the patient was referred for bilateral breast MRIs and these were performed October 30, 2010. In the left breast there was a 1.8 cm ill-defined area in the central breast associated with post biopsy changes. There were really no other suspicious areas in either breast and no bony abnormalities and no abnormal appearing or enlarged lymph nodes.   Patient underwent left lumpectomy and sentinel lymph node sampling December 10, 2010 ([id number]) for a  1.1-cm invasive ductal carcinoma, grade 3, with 0/2 sentinel lymph nodes involved and so T1c N0 (stage I); the tumor being estrogen receptor 98% and progesterone receptor 13% positive, with no HER-2 amplification (by initial determination, but amplified on  repeat with a ratio of 2.30); with an Oncotype DX score of 43 predicting a 29% risk of recurrence if she takes 5 years of tamoxifen. (Her-2 was negative on the Oncotype DX determination, which uses a completely different method).   Her subsequent history is as detailed below.    PAST MEDICAL HISTORY: Past Medical History:  Diagnosis Date  . Breast cancer ([place])    stage I high grade invasive ductal ca left breast  . Breast lump    left  . Cancer ([place])   . Diabetes mellitus    borderline  . Gum disease   . Hypertension   . Sleep apnea     PAST SURGICAL HISTORY: Past Surgical History:  Procedure Laterality Date  . BREAST LUMPECTOMY  2012   left  . FOOT MASS EXCISION    . LAPAROSCOPIC GASTRIC BANDING    . MASS EXCISION     back  . PORTACATH PLACEMENT  02/11/11  . PORTACATH REMOVAL  MARCH 2014  . RESECTOSCOPIC POLYPECTOMY  2001   MYOMECTOMY  . SOFT TISSUE CYST EXCISION     back cyst  . VAGINAL MASS EXCISION      FAMILY HISTORY Family History  Problem Relation Age of Onset  . Cancer Maternal Uncle 70       prostate  . Alcohol abuse Father   . Cirrhosis Father   . Cancer Mother        breast    GYNECOLOGIC HISTORY: ((Updated 01/26/2013) She had menarche at age 12. She is GX P0.  Last menstrual period was late May 2012.  She has never used hormone replacement.     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 She attends a [employer]. She goes to the gym at least three times a week.    ADVANCED DIRECTIVES: Not in place   HEALTH MAINTENANCE: Social History   Tobacco Use  . Smoking status: Former Smoker    Packs/day: 1.00    Years: 21.00    Pack years: 21.00    Types: Cigarettes    Last attempt to quit: 03/25/1999    Years since quitting: 19.1  . Smokeless tobacco: Never Used  Substance Use Topics  . Alcohol use: No  . Drug use: No     Colonoscopy:   PAP: May 2014, Dr. Toney Rakes  Bone  density: March 2011, Normal  Lipid panel:  UTD, [name]  No Known Allergies  Current Outpatient Medications  Medication Sig Dispense Refill  . albuterol (PROVENTIL HFA;VENTOLIN HFA) 108 (90 Base) MCG/ACT inhaler Inhale 1-2 puffs into the lungs every 4 (four) hours as needed for wheezing or shortness of breath. 1 Inhaler 0  . amLODipine-olmesartan (AZOR) 10-40 MG per tablet Take 1 tablet by mouth daily. 30 tablet 6  . anastrozole (ARIMIDEX) 1 MG tablet Take 1 tablet (1 mg total) by mouth daily. 90 tablet 4  . Multiple Vitamins-Minerals (ALIVE ONCE DAILY WOMENS PO) Take by mouth.     No current facility-administered medications for this visit.    OBJECTIVE: Morbidly obese African-American woman in no acute distress  Vitals:   05/18/18 1535  BP: (!) 142/92  Pulse: 66  Resp: 18  Temp: 98.5 F (36.9 C)  SpO2:  98%     Body mass index is 42.32 kg/m.    ECOG FS: 1 Filed Weights   05/18/18 1535  Weight: 216 lb 11.2 oz (98.3 kg)   Sclerae unicteric, EOMs intact Oropharynx clear and moist No cervical or supraclavicular adenopathy Lungs no rales or rhonchi Heart regular rate and rhythm Abd soft, obese, nontender, positive bowel sounds MSK no focal spinal tenderness, no upper extremity lymphedema Neuro: nonfocal, well oriented, appropriate affect Breasts: Right breast is unremarkable.  The left breast is status post lumpectomy.  There is no palpable mass.  There is some distortion of the breast contour as before.  Both axillae are benign    LAB RESULTS: Lab Results  Component Value Date   WBC 5.0 05/18/2018   NEUTROABS 3.1 05/18/2018   HGB 11.8 (L) 05/18/2018   HCT 37.4 05/18/2018   MCV 83.9 05/18/2018   PLT 288 05/18/2018      Chemistry      Component Value Date/Time   NA 141 05/18/2018 1509   NA 140 04/22/2017 1327   K 3.7 05/18/2018 1509   K 4.0 04/22/2017 1327   CL 107 05/18/2018 1509   CL 105 09/08/2012 1320   CO2 26 05/18/2018 1509   CO2 25 04/22/2017 1327    BUN 12 05/18/2018 1509   BUN 15.0 04/22/2017 1327   CREATININE 0.89 05/18/2018 1509   CREATININE 0.8 04/22/2017 1327      Component Value Date/Time   CALCIUM 9.0 05/18/2018 1509   CALCIUM 9.0 04/22/2017 1327   ALKPHOS 56 05/18/2018 1509   ALKPHOS 58 04/22/2017 1327   AST 13 (L) 05/18/2018 1509   AST 13 04/22/2017 1327   ALT 13 05/18/2018 1509   ALT 11 04/22/2017 1327   BILITOT 0.6 05/18/2018 1509   BILITOT 0.38 04/22/2017 1327       STUDIES: No results found.    ASSESSMENT: 63 y.o. [place] woman   1. Status post left lumpectomy and sentinel lymph node sampling August 2012 for a T1cN0, stage IA invasive ductal carcinoma of overlapping sites, grade 3, estrogen receptor 98% and progesterone receptor 13% positive, HER-2/neu amplified with a ratio of 2.3.   2. Oncotype showed a recurrence score of 43 ( "high risk"), predicting a 29% risk of recurrence if the patient's only adjuvant treatment was tamoxifen   3. received adjuvant docetaxel/ cyclophosphamide x4, completed December 2012   4. trastuzumab started 04/15/2011, completed 05/05/2012; final echo 02/18/2012  5. radiation therapy, completed July 23, 2011  6. began on tamoxifen March 2013, but never taken consistently   RECURRENT DISEASE: November 2018 7.  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  Anastrozole started 04/22/2017  9.  Definitive surgery pending  10.  Genetics testing requested   PLAN: Hansika is upset that her left-sided breast cancer appears to be growing when she had been told previously that it was shrinking.  In any case she is now more agreeable to considering surgery.  She specifically would like to see Dr. Lucia Gaskins her surgeon around April 14, which is when she will be off for the Easter break.  She would then have her surgery sometime late May after her next mammogram.  She would like to go off anastrozole.  We  discussed going back to tamoxifen but she had poor tolerance of that as well.  We discussed exemestane and she has a good understanding of the possible toxicities, side effects and complications of  this agent.  I went ahead and ordered it for her  She tells me she has found out that 1 of her relatives had prostate cancer and another one breast cancer.  This qualifies her for genetics testing and I have requested that.  I gave her information on the pelvic floor program.  She will read it and let me know if she wishes for me to place a referral.  Otherwise she will return to see me in July.  I anticipate she will have had her surgery by then.  If she has any problems with the exemestane she will let me know, otherwise the plan will be to continue that until she has received 5 years of antiestrogen therapy  Magrinat, Virgie Dad, MD  05/18/18 4:14 PM Medical Oncology and Hematology [facility] [place], [place] [address] Tel. [phone]    Fax. [phone]  I, Jacqualyn Posey am acting as a Education administrator for Chauncey Cruel, MD.   I, Lurline Del MD, have reviewed the above documentation for accuracy and completeness, and I agree with the above.

## 2018-05-18 ENCOUNTER — Telehealth: Payer: Self-pay | Admitting: Oncology

## 2018-05-18 ENCOUNTER — Inpatient Hospital Stay (HOSPITAL_BASED_OUTPATIENT_CLINIC_OR_DEPARTMENT_OTHER): Payer: BC Managed Care – PPO | Admitting: Oncology

## 2018-05-18 ENCOUNTER — Inpatient Hospital Stay: Payer: BC Managed Care – PPO | Attending: Oncology

## 2018-05-18 VITALS — BP 142/92 | HR 66 | Temp 98.5°F | Resp 18 | Ht 60.0 in | Wt 216.7 lb

## 2018-05-18 DIAGNOSIS — Z79811 Long term (current) use of aromatase inhibitors: Secondary | ICD-10-CM

## 2018-05-18 DIAGNOSIS — Z87891 Personal history of nicotine dependence: Secondary | ICD-10-CM | POA: Diagnosis not present

## 2018-05-18 DIAGNOSIS — E119 Type 2 diabetes mellitus without complications: Secondary | ICD-10-CM | POA: Insufficient documentation

## 2018-05-18 DIAGNOSIS — Z170 Estrogen receptor positive status [ER+]: Secondary | ICD-10-CM | POA: Diagnosis not present

## 2018-05-18 DIAGNOSIS — I10 Essential (primary) hypertension: Secondary | ICD-10-CM | POA: Diagnosis not present

## 2018-05-18 DIAGNOSIS — Z923 Personal history of irradiation: Secondary | ICD-10-CM | POA: Insufficient documentation

## 2018-05-18 DIAGNOSIS — Z86000 Personal history of in-situ neoplasm of breast: Secondary | ICD-10-CM | POA: Insufficient documentation

## 2018-05-18 DIAGNOSIS — D0512 Intraductal carcinoma in situ of left breast: Secondary | ICD-10-CM

## 2018-05-18 DIAGNOSIS — R232 Flushing: Secondary | ICD-10-CM | POA: Diagnosis not present

## 2018-05-18 DIAGNOSIS — M791 Myalgia, unspecified site: Secondary | ICD-10-CM | POA: Diagnosis not present

## 2018-05-18 DIAGNOSIS — Z79899 Other long term (current) drug therapy: Secondary | ICD-10-CM

## 2018-05-18 DIAGNOSIS — D119 Benign neoplasm of major salivary gland, unspecified: Secondary | ICD-10-CM | POA: Insufficient documentation

## 2018-05-18 DIAGNOSIS — C50812 Malignant neoplasm of overlapping sites of left female breast: Secondary | ICD-10-CM

## 2018-05-18 DIAGNOSIS — G4730 Sleep apnea, unspecified: Secondary | ICD-10-CM

## 2018-05-18 DIAGNOSIS — C50912 Malignant neoplasm of unspecified site of left female breast: Secondary | ICD-10-CM

## 2018-05-18 LAB — COMPREHENSIVE METABOLIC PANEL
ALT: 13 U/L (ref 0–44)
AST: 13 U/L — ABNORMAL LOW (ref 15–41)
Albumin: 3.6 g/dL (ref 3.5–5.0)
Alkaline Phosphatase: 56 U/L (ref 38–126)
Anion gap: 8 (ref 5–15)
BILIRUBIN TOTAL: 0.6 mg/dL (ref 0.3–1.2)
BUN: 12 mg/dL (ref 8–23)
CO2: 26 mmol/L (ref 22–32)
Calcium: 9 mg/dL (ref 8.9–10.3)
Chloride: 107 mmol/L (ref 98–111)
Creatinine, Ser: 0.89 mg/dL (ref 0.44–1.00)
GFR calc non Af Amer: >60 mL/min (ref >60)
Glucose, Bld: 87 mg/dL (ref 70–99)
Potassium: 3.7 mmol/L (ref 3.5–5.1)
Sodium: 141 mmol/L (ref 135–145)
Total Protein: 7 g/dL (ref 6.5–8.1)

## 2018-05-18 LAB — CBC WITH DIFFERENTIAL/PLATELET
Abs Immature Granulocytes: 0.01 10*3/uL (ref 0.00–0.07)
Basophils Absolute: 0 10*3/uL (ref 0.0–0.1)
Basophils Relative: 0 %
EOS ABS: 0 10*3/uL (ref 0.0–0.5)
Eosinophils Relative: 1 %
HCT: 37.4 % (ref 36.0–46.0)
Hemoglobin: 11.8 g/dL — ABNORMAL LOW (ref 12.0–15.0)
Immature Granulocytes: 0 %
Lymphocytes Relative: 29 %
Lymphs Abs: 1.4 10*3/uL (ref 0.7–4.0)
MCH: 26.5 pg (ref 26.0–34.0)
MCHC: 31.6 g/dL (ref 30.0–36.0)
MCV: 83.9 fL (ref 80.0–100.0)
Monocytes Absolute: 0.5 10*3/uL (ref 0.1–1.0)
Monocytes Relative: 9 %
Neutro Abs: 3.1 10*3/uL (ref 1.7–7.7)
Neutrophils Relative %: 61 %
Platelets: 288 10*3/uL (ref 150–400)
RBC: 4.46 MIL/uL (ref 3.87–5.11)
RDW: 14.5 % (ref 11.5–15.5)
WBC: 5 10*3/uL (ref 4.0–10.5)
nRBC: 0 % (ref 0.0–0.2)

## 2018-05-18 MED ORDER — EXEMESTANE 25 MG PO TABS: 25.0000 mg | ORAL_TABLET | Freq: Every day | ORAL | 4 refills | Status: DC

## 2018-05-18 NOTE — Telephone Encounter (Signed)
Gave avs and calendar ° °

## 2018-06-01 ENCOUNTER — Encounter: Payer: Self-pay | Admitting: Oncology

## 2018-06-22 ENCOUNTER — Encounter: Payer: Self-pay | Admitting: *Deleted

## 2018-06-22 ENCOUNTER — Inpatient Hospital Stay (HOSPITAL_BASED_OUTPATIENT_CLINIC_OR_DEPARTMENT_OTHER): Payer: BC Managed Care – PPO | Admitting: Genetics

## 2018-06-22 ENCOUNTER — Inpatient Hospital Stay: Payer: BC Managed Care – PPO | Attending: Oncology

## 2018-06-22 DIAGNOSIS — Z170 Estrogen receptor positive status [ER+]: Secondary | ICD-10-CM

## 2018-06-22 DIAGNOSIS — Z79811 Long term (current) use of aromatase inhibitors: Secondary | ICD-10-CM | POA: Diagnosis not present

## 2018-06-22 DIAGNOSIS — C50912 Malignant neoplasm of unspecified site of left female breast: Secondary | ICD-10-CM

## 2018-06-22 DIAGNOSIS — C50812 Malignant neoplasm of overlapping sites of left female breast: Secondary | ICD-10-CM | POA: Diagnosis not present

## 2018-06-22 DIAGNOSIS — Z1379 Encounter for other screening for genetic and chromosomal anomalies: Secondary | ICD-10-CM | POA: Insufficient documentation

## 2018-06-22 DIAGNOSIS — E119 Type 2 diabetes mellitus without complications: Secondary | ICD-10-CM | POA: Diagnosis not present

## 2018-06-22 DIAGNOSIS — Z86000 Personal history of in-situ neoplasm of breast: Secondary | ICD-10-CM | POA: Diagnosis not present

## 2018-06-22 DIAGNOSIS — D0512 Intraductal carcinoma in situ of left breast: Secondary | ICD-10-CM

## 2018-06-22 DIAGNOSIS — Z803 Family history of malignant neoplasm of breast: Secondary | ICD-10-CM

## 2018-06-22 DIAGNOSIS — Z79899 Other long term (current) drug therapy: Secondary | ICD-10-CM | POA: Insufficient documentation

## 2018-06-22 DIAGNOSIS — Z923 Personal history of irradiation: Secondary | ICD-10-CM | POA: Insufficient documentation

## 2018-06-22 DIAGNOSIS — Z8042 Family history of malignant neoplasm of prostate: Secondary | ICD-10-CM

## 2018-06-22 DIAGNOSIS — I10 Essential (primary) hypertension: Secondary | ICD-10-CM | POA: Insufficient documentation

## 2018-06-22 LAB — COMPREHENSIVE METABOLIC PANEL
ALBUMIN: 3.9 g/dL (ref 3.5–5.0)
ALT: 11 U/L (ref 0–44)
AST: 12 U/L — ABNORMAL LOW (ref 15–41)
Alkaline Phosphatase: 62 U/L (ref 38–126)
Anion gap: 9 (ref 5–15)
BUN: 13 mg/dL (ref 8–23)
CO2: 27 mmol/L (ref 22–32)
Calcium: 9.4 mg/dL (ref 8.9–10.3)
Chloride: 105 mmol/L (ref 98–111)
Creatinine, Ser: 0.84 mg/dL (ref 0.44–1.00)
GFR calc Af Amer: >60 mL/min (ref >60)
Glucose, Bld: 84 mg/dL (ref 70–99)
Potassium: 4 mmol/L (ref 3.5–5.1)
Sodium: 141 mmol/L (ref 135–145)
Total Bilirubin: 0.4 mg/dL (ref 0.3–1.2)
Total Protein: 7.5 g/dL (ref 6.5–8.1)

## 2018-06-22 LAB — CBC WITH DIFFERENTIAL/PLATELET
Abs Immature Granulocytes: 0.02 10*3/uL (ref 0.00–0.07)
Basophils Absolute: 0 10*3/uL (ref 0.0–0.1)
Basophils Relative: 0 %
EOS PCT: 1 %
Eosinophils Absolute: 0 10*3/uL (ref 0.0–0.5)
HCT: 39.6 % (ref 36.0–46.0)
Hemoglobin: 12.6 g/dL (ref 12.0–15.0)
Immature Granulocytes: 0 %
Lymphocytes Relative: 35 %
Lymphs Abs: 1.8 10*3/uL (ref 0.7–4.0)
MCH: 26.6 pg (ref 26.0–34.0)
MCHC: 31.8 g/dL (ref 30.0–36.0)
MCV: 83.5 fL (ref 80.0–100.0)
Monocytes Absolute: 0.4 10*3/uL (ref 0.1–1.0)
Monocytes Relative: 8 %
Neutro Abs: 2.9 10*3/uL (ref 1.7–7.7)
Neutrophils Relative %: 56 %
Platelets: 297 10*3/uL (ref 150–400)
RBC: 4.74 MIL/uL (ref 3.87–5.11)
RDW: 14.2 % (ref 11.5–15.5)
WBC: 5.2 10*3/uL (ref 4.0–10.5)
nRBC: 0 % (ref 0.0–0.2)

## 2018-06-23 ENCOUNTER — Encounter: Payer: Self-pay | Admitting: Genetics

## 2018-06-23 DIAGNOSIS — Z8042 Family history of malignant neoplasm of prostate: Secondary | ICD-10-CM | POA: Insufficient documentation

## 2018-06-23 DIAGNOSIS — Z803 Family history of malignant neoplasm of breast: Secondary | ICD-10-CM | POA: Insufficient documentation

## 2018-06-23 HISTORY — DX: Family history of malignant neoplasm of breast: Z80.3

## 2018-06-23 HISTORY — DX: Family history of malignant neoplasm of prostate: Z80.42

## 2018-06-23 NOTE — Progress Notes (Addendum)
REFERRING PROVIDER: Chauncey Cruel, MD [address] [place],  [address]  PRIMARY PROVIDER:  Marton Redwood, MD  PRIMARY REASON FOR VISIT:  1. Recurrent breast cancer, left ([place])   2. Family history of breast cancer   3. Family history of prostate cancer   4. Malignant neoplasm of overlapping sites of left breast in female, estrogen receptor positive ([place])   5. Ductal carcinoma in situ (DCIS) of left breast     HISTORY OF PRESENT ILLNESS:   [name], a 63 y.o. female, was seen for a [place] cancer genetics consultation at the request of Dr. Jana Hakim due to a personal and family history of cancer.  [name] presents to clinic today to discuss the possibility of a hereditary predisposition to cancer, genetic testing, and to further clarify her future cancer risks, as well as potential cancer risks for family members.   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  She underwent lumpectomy, chemotherapy, radiation, and antiestrogen therapy.  In November 2018 she had recurrent disease, DCIS, ER/PR+.  She had antiestrogen therapy and is considering surgical options now.    HORMONAL RISK FACTORS:  Menarche was at age 81.  First live birth at age N/A.  Ovaries intact: yes.  Hysterectomy: no.  Menopausal status: postmenopausal.  HRT use: 0 years. Colonoscopy: yes; normal.  Past Medical History:  Diagnosis Date  . Breast cancer ([place])    stage I high grade invasive ductal ca left breast  . Breast lump    left  . Cancer ([place])   . Diabetes mellitus    borderline  . Family history of breast cancer 06/23/2018  . Family history of prostate cancer 06/23/2018  . Gum disease   . Hypertension   . Sleep apnea     Past Surgical History:  Procedure Laterality Date  . BREAST LUMPECTOMY  2012   left  . FOOT MASS EXCISION    . LAPAROSCOPIC GASTRIC BANDING    . MASS EXCISION      back  . PORTACATH PLACEMENT  02/11/11  . PORTACATH REMOVAL  MARCH 2014  . RESECTOSCOPIC POLYPECTOMY  2001   MYOMECTOMY  . SOFT TISSUE CYST EXCISION     back cyst  . VAGINAL MASS EXCISION      Social History   Socioeconomic History  . Marital status: Divorced    Spouse name: Not on file  . Number of children: Not on file  . Years of education: Not on file  . Highest education level: Not on file  Occupational History  . Not on file  Social Needs  . Financial resource strain: Not on file  . Food insecurity:    Worry: Not on file    Inability: Not on file  . Transportation needs:    Medical: Not on file    Non-medical: Not on file  Tobacco Use  . Smoking status: Former Smoker    Packs/day: 1.00    Years: 21.00    Pack years: 21.00    Types: Cigarettes    Last attempt to quit: 03/25/1999    Years since quitting: 19.2  . Smokeless tobacco: Never Used  Substance and Sexual Activity  . Alcohol use: No  . Drug use: No  . Sexual activity: Yes    Birth control/protection: Condom  Lifestyle  . Physical activity:    Days per week: Not on file  Minutes per session: Not on file  . Stress: Not on file  Relationships  . Social connections:    Talks on phone: Not on file    Gets together: Not on file    Attends religious service: Not on file    Active member of club or organization: Not on file    Attends meetings of clubs or organizations: Not on file    Relationship status: Not on file  Other Topics Concern  . Not on file  Social History Narrative  . Not on file     FAMILY HISTORY:  We obtained a detailed, 4-generation family history.  Significant diagnoses are listed below: Family History  Problem Relation Age of Onset  . Cancer Maternal Uncle 45       prostate, metastatic  . Alcohol abuse Father   . Cirrhosis Father   . Breast cancer Mother 21   Ms.  has no children.  She has a full brother in his 3's and a maternal half-sister who is 53 with no hx of  cancer.    [name] father: died in his 81's due to cirrhosis.  Paternal aunts/Uncles: no paternal aunts/uncles, father was only child Paternal cousins: N/A Paternal grandfather: died in his 109's with no hx of cancer.  Paternal grandmother:died in her 32's with no hx of cancer.   [name] mother: breast cancer dx in her late 60's, mother is now 37.  Maternal Aunts/Uncles: 2 maternal aunts and 1 maternal uncles. The uncle died of metastatic prostate cancer in his 63's.  1 aunt had radiation treatment, but unk why.  Maternal cousins: no known hx of cancer Maternal grandfather: no hx of cancer Maternal grandmother:died at 74 with no hx of cancer.   [name] is unaware of previous family history of genetic testing for hereditary cancer risks. Patient's maternal ancestors are of African American descent, and paternal ancestors are of African American descent. There is no reported Ashkenazi Jewish ancestry. There is no known consanguinity.  GENETIC COUNSELING ASSESSMENT: [name] is a 63 y.o. female with a personal and family history which is somewhat suggestive of a Hereditary Cancer Predisposition Syndrome. We, therefore, discussed and recommended the following at today's visit.   DISCUSSION: We reviewed the characteristics, features and inheritance patterns of hereditary cancer syndromes. We also discussed genetic testing, including the appropriate family members to test, the process of testing, insurance coverage and turn-around-time for results. We discussed the implications of a negative, positive and/or variant of uncertain significant result. We recommended [name] pursue genetic testing for the Breast and GYN cancer panel with plans to reflex to the Common Hereditary Cancers gene panel.   [facility] Breast [facility] Cancer Panel: ATM BARD1 BRCA1 BRCA2 BRIP1 CDH1 CHEK2 DICER1 EPCAM MLH1 MSH2 MSH6 NBN NF1 PALB2 PMS2 PTEN RAD50 RAD51C RAD51D SMARCA4 STK11 TP53  The Common  Hereditary Cancer Panel offered by [facility] includes sequencing and/or deletion duplication testing of the following 47 genes: APC, ATM, AXIN2, BARD1, BMPR1A, BRCA1, BRCA2, BRIP1, CDH1, CDK4, CDKN2A, CHEK2, CTNNA1, DICER1, EPCAM, GREM1, HOXB13, KIT, MEN1, MLH1, MLH3, MSH2, MSH3, MSH6, MUTYH, NBN, NF1, NTHL1, PALB2, PDGFRA, PMS2, POLD1, POLE, PTEN, RAD50, RAD51C, RAD51D, RNF43, RPS20, SDHA, SDHB, SDHC, SDHD, SMAD4, SMARCA4, STK11, TP53, TSC1, TSC2, VHL  We discussed that only 5-10% of cancers are associated with a Hereditary cancer predisposition syndrome.  One of the most common hereditary cancer syndromes that increases breast cancer risk is called Hereditary Breast and Ovarian Cancer (HBOC) syndrome.  This  syndrome is caused by mutations in the BRCA1 and BRCA2 genes.  This syndrome increases an individual's lifetime risk to develop breast, ovarian, pancreatic, and other types of cancer.  There are also many other cancer predisposition syndromes caused by mutations in several other genes.  We discussed that if she is found to have a mutation in one of these genes, it may impact surgical decisions, and alter future medical management recommendations such as increased cancer screenings and consideration of risk reducing surgeries.  A positive result could also have implications for the patient's family members.  A Negative result would mean we were unable to identify a hereditary component to her cancer, but does not rule out the possibility of a hereditary basis for her cancer.  There could be mutations that are undetectable by current technology, or in genes not yet tested or identified to increase cancer risk.    We discussed the potential to find a Variant of Uncertain Significance or VUS.  These are variants that have not yet been identified as pathogenic or benign, and it is unknown if this variant is associated with increased cancer risk or if this is a normal finding.  Most VUS's are reclassified to  benign or likely benign.   It should not be used to make medical management decisions. With time, we suspect the lab will determine the significance of any VUS's identified if any.   Based on Ms. Aguirre's personal and family history of cancer, she meets medical criteria for genetic testing. Despite that she meets criteria, she may still have an out of pocket cost. The laboratory can provide her with an estimate of her OOP cost. she was given the contact information of the laboratory if she has further questions.   PLAN: After considering the risks, benefits, and limitations, Ms. Buster  provided informed consent to pursue genetic testing and the blood sample was sent to [facility] for analysis of the Breast and GYN panel with reflex to Common Hereditary Cancers Panel. Results should be available within approximately 2-3 weeks' time, at which point they will be disclosed by telephone to Ms. Hemm, as will any additional recommendations warranted by these results. Ms. Victorino will receive a summary of her genetic counseling visit and a copy of her results once available. This information will also be available in Epic. We encouraged Ms. Cuadras to remain in contact with cancer genetics annually so that we can continuously update the family history and inform her of any changes in cancer genetics and testing that may be of benefit for her family. Ms. Riendeau questions were answered to her satisfaction today. Our contact information was provided should additional questions or concerns arise.  Lastly, we encouraged Ms. Cino to remain in contact with cancer genetics annually so that we can continuously update the family history and inform her of any changes in cancer genetics and testing that may be of benefit for this family.   Ms.  Prestage questions were answered to her satisfaction today. Our contact information was provided should additional questions or concerns arise. Thank you for the referral  and allowing [place] to share in the care of your patient.   Tana Felts, MS, [facility] Certified Genetic Counselor [email or web address] phone: [phone]  The patient was seen for a total of 35 minutes in face-to-face genetic counseling.  The patient was accompanied today by her mother. This patient was discussed with Drs. Magrinat, Lindi Adie and/or Burr Medico who agrees with the above.

## 2018-07-13 ENCOUNTER — Telehealth: Payer: Self-pay | Admitting: Licensed Clinical Social Worker

## 2018-07-13 NOTE — Telephone Encounter (Signed)
Ms. Lenhard called because she had received a voicemail from the genetic testing lab, [facility] and was wondering if she needed to call them back. I let her know they were calling to discuss billing options and to give them a call. I will call her again with her results when they are back.

## 2018-07-18 ENCOUNTER — Telehealth: Payer: Self-pay | Admitting: Licensed Clinical Social Worker

## 2018-07-19 NOTE — Telephone Encounter (Signed)
Revealed negative genetic testing. This normal result is reassuring and indicates that it is unlikely Ms. Smethers's cancer is due to a hereditary cause.  It is unlikely that there is an increased risk of another cancer due to a mutation in one of these genes.  However, genetic testing is not perfect, and cannot definitively rule out a hereditary cause.  It will be important for her to keep in contact with genetics to learn if any additional testing may be needed in the future.

## 2018-07-20 ENCOUNTER — Encounter: Payer: Self-pay | Admitting: Licensed Clinical Social Worker

## 2018-07-20 ENCOUNTER — Ambulatory Visit: Payer: Self-pay | Admitting: Licensed Clinical Social Worker

## 2018-07-20 DIAGNOSIS — Z1379 Encounter for other screening for genetic and chromosomal anomalies: Secondary | ICD-10-CM

## 2018-07-20 NOTE — Progress Notes (Signed)
HPI:  Ms. Belding was previously seen in the [place] clinic due to a personal and family history of cancer and concerns regarding a hereditary predisposition to cancer. Please refer to our prior cancer genetics clinic note for more information regarding our discussion, assessment and recommendations, at the time. Ms. Wente recent genetic test results were disclosed to her, as were recommendations warranted by these results. These results and recommendations are discussed in more detail below.  CANCER HISTORY:  In 2012, at the age of 61, Ms. 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  She underwent lumpectomy, chemotherapy, radiation, and antiestrogen therapy.  In November 2018 she had recurrent disease, DCIS, ER/PR+.  She had antiestrogen therapy and is considering surgical options now.    FAMILY HISTORY:  We obtained a detailed, 4-generation family history.  Significant diagnoses are listed below: Family History  Problem Relation Age of Onset  . Cancer Maternal Uncle 31       prostate, metastatic  . Alcohol abuse Father   . Cirrhosis Father   . Breast cancer Mother 52   Ms.  has no children.  She has a full brother in his 7's and a maternal half-sister who is 62 with no hx of cancer.    Ms. Stangler father: died in his 59's due to cirrhosis.  Paternal aunts/Uncles: no paternal aunts/uncles, father was only child Paternal cousins: N/A Paternal grandfather: died in his 47's with no hx of cancer.  Paternal grandmother:died in her 29's with no hx of cancer.   Ms. Hassan mother: breast cancer dx in her late 21's, mother is now 43.  Maternal Aunts/Uncles: 2 maternal aunts and 1 maternal uncles. The uncle died of metastatic prostate cancer in his 4's.  1 aunt had radiation treatment, but unk why.  Maternal cousins: no known hx of cancer Maternal grandfather: no hx of cancer Maternal grandmother:died at 57 with no hx of  cancer.   Ms. Mirelez is unaware of previous family history of genetic testing for hereditary cancer risks. Patient's maternal ancestors are of African American descent, and paternal ancestors are of African American descent. There is no reported Ashkenazi Jewish ancestry. There is no known consanguinity.   GENETIC TEST RESULTS: Genetic testing reported out on 07/17/2018 through the [facility] Common Hereditary cancer panel found no pathogenic mutations. The Common Hereditary Cancers Panel offered by [facility] includes sequencing and/or deletion duplication testing of the following 47 genes: APC, ATM, AXIN2, BARD1, BMPR1A, BRCA1, BRCA2, BRIP1, CDH1, CDKN2A (p14ARF), CDKN2A (p16INK4a), CKD4, CHEK2, CTNNA1, DICER1, EPCAM (Deletion/duplication testing only), GREM1 (promoter region deletion/duplication testing only), KIT, MEN1, MLH1, MSH2, MSH3, MSH6, MUTYH, NBN, NF1, NHTL1, PALB2, PDGFRA, PMS2, POLD1, POLE, PTEN, RAD50, RAD51C, RAD51D, SDHB, SDHC, SDHD, SMAD4, SMARCA4. STK11, TP53, TSC1, TSC2, and VHL.  The following genes were evaluated for sequence changes only: SDHA and HOXB13 c.251G>A variant only.  The test report has been scanned into EPIC and is located under the Molecular Pathology section of the Results Review tab.  A portion of the result report is included below for reference.     We discussed with Ms. Westergard that because current genetic testing is not perfect, it is possible there may be a gene mutation in one of these genes that current testing cannot detect, but that chance is small.  We also discussed, that there could be another gene that has not yet been discovered, or that we have not yet tested, that is responsible for  the cancer diagnoses in the family. It is also possible there is a hereditary cause for the cancer in the family that Ms. Warmuth did not inherit and therefore was not identified in her testing.  Therefore, it is important to remain in touch with cancer genetics in the future so  that we can continue to offer Ms. Scifres the most up to date genetic testing.    ADDITIONAL GENETIC TESTING: We discussed with Ms. Steinmiller that her genetic testing was fairly extensive.  If there are genes identified to increase cancer risk that can be analyzed in the future, we would be happy to discuss and coordinate this testing at that time.    CANCER SCREENING RECOMMENDATIONS: Ms. Amerman test result is considered negative (normal).  This means that we have not identified a hereditary cause for her personal and family history of cancer at this time. Most cancers happen by chance and this negative test suggests that her cancer may fall into this category.    While reassuring, this does not definitively rule out a hereditary predisposition to cancer. It is still possible that there could be genetic mutations that are undetectable by current technology. There could be genetic mutations in genes that have not been tested or identified to increase cancer risk.  Therefore, it is recommended she continue to follow the cancer management and screening guidelines provided by her oncology and primary healthcare provider.   An individual's cancer risk and medical management are not determined by genetic test results alone. Overall cancer risk assessment incorporates additional factors, including personal medical history, family history, and any available genetic information that may result in a personalized plan for cancer prevention and surveillance  RECOMMENDATIONS FOR FAMILY MEMBERS:  Women in this family might be at some increased risk of developing cancer, over the general population risk, simply due to the family history of cancer.  We recommended women in this family have a yearly mammogram beginning at age 79, or 5 years younger than the earliest onset of cancer, an annual clinical breast exam, and perform monthly breast self-exams. Women in this family should also have a gynecological exam as  recommended by their primary provider. All family members should have a colonoscopy by age 60.  FOLLOW-UP: Lastly, we discussed with Ms. Beauchesne that cancer genetics is a rapidly advancing field and it is possible that new genetic tests will be appropriate for her and/or her family members in the future. We encouraged her to remain in contact with cancer genetics on an annual basis so we can update her personal and family histories and let her know of advances in cancer genetics that may benefit this family.   Our contact number was provided. Ms. Aune questions were answered to her satisfaction, and she knows she is welcome to call us at anytime with additional questions or concerns.   Faith Rogue, MS Genetic Counselor [email or web address] Phone: [phone]

## 2018-11-20 ENCOUNTER — Inpatient Hospital Stay (HOSPITAL_BASED_OUTPATIENT_CLINIC_OR_DEPARTMENT_OTHER): Payer: BC Managed Care – PPO | Admitting: Oncology

## 2018-11-20 ENCOUNTER — Inpatient Hospital Stay: Payer: BC Managed Care – PPO | Attending: Genetics

## 2018-11-20 ENCOUNTER — Encounter: Payer: Self-pay | Admitting: Oncology

## 2018-11-20 DIAGNOSIS — C50912 Malignant neoplasm of unspecified site of left female breast: Secondary | ICD-10-CM

## 2018-11-20 NOTE — Progress Notes (Signed)
No show

## 2019-01-09 ENCOUNTER — Other Ambulatory Visit (HOSPITAL_COMMUNITY): Payer: Self-pay | Admitting: Surgery

## 2019-01-09 DIAGNOSIS — D0592 Unspecified type of carcinoma in situ of left breast: Secondary | ICD-10-CM

## 2019-02-06 ENCOUNTER — Encounter: Payer: Self-pay | Admitting: Gynecology

## 2019-02-23 ENCOUNTER — Encounter (HOSPITAL_COMMUNITY): Payer: Self-pay

## 2019-02-23 NOTE — Progress Notes (Signed)
Patient denies shortness of breath, fever, cough and chest pain.  PCP - Alonna Minium Cardiologist - Denies Oncology Dr Jana Hakim  Chest x-ray - denies EKG - DOS 03/01/19 Stress Test - denies ECHO - 02/18/12 Cardiac Cath - denies  Sleep Study - Yes CPAP - Uses CPAP nightly  Pre-Diabetic:  Borderline.  Patient does not check her blood sugar.  ERAS: Clears til 4:30 am DOS, no drink  Anesthesia review: No  Coronavirus Screening Have you experienced the following symptoms:  Cough yes/no: No Fever (>100.59F)  yes/no: No Runny nose yes/no: No Sore throat yes/no: No Difficulty breathing/shortness of breath  yes/no: No  Have you traveled in the last 14 days and where? yes/no: No  Covid test 02/26/19 pending.

## 2019-02-23 NOTE — Pre-Procedure Instructions (Signed)
[place], [place] [address] Phone: [phone] Fax: [place], [address] [place] [address] Phone: [phone] Fax: [phone]      Your procedure is scheduled on  03-01-19  Report to [facility] Main Entrance "A" at 0530A.M., and check in at the Admitting office.  Call this number if you have problems the morning of surgery:  [phone]  Call [phone] if you have any questions prior to your surgery date Monday-Friday 8am-4pm    Remember:  Do not eat  after midnight the night before your surgery  You may drink clear liquids until 0430 AM the morning of your surgery.   Clear liquids allowed are: Water, Non-Citrus Juices (without pulp), Carbonated Beverages, Clear Tea, Black Coffee Only, and Gatorade   Take these medicines the morning of surgery with A SIP OF WATER :none   7 days prior to surgery STOP taking any Aspirin (unless otherwise instructed by your surgeon), Aleve, Naproxen, Ibuprofen, Motrin, Advil, Goody's, BC's, all herbal medications, fish oil, and all vitamins.    The Morning of Surgery  Do not wear jewelry, make-up or nail polish.  Do not wear lotions, powders, or perfumes/colognes, or deodorant  Do not shave 48 hours prior to surgery.    Do not bring valuables to the hospital.  [facility] is not responsible for any belongings or valuables.  If you are a smoker, DO NOT Smoke 24 hours prior to surgery IF you wear a CPAP at night please bring your mask, tubing, and machine the morning of surgery   Remember that you must have someone to transport you home after your surgery, and remain with you for 24 hours if you are discharged the same day.   Contacts, glasses, hearing aids, dentures or bridgework may not be worn into surgery.    Leave your  suitcase in the car.  After surgery it may be brought to your room.  For patients admitted to the hospital, discharge time will be determined by your treatment team.  Patients discharged the day of surgery will not be allowed to drive home.    Special instructions:   [facility]- Preparing For Surgery  Before surgery, you can play an important role. Because skin is not sterile, your skin needs to be as free of germs as possible. You can reduce the number of germs on your skin by washing with CHG (chlorahexidine gluconate) Soap before surgery.  CHG is an antiseptic cleaner which kills germs and bonds with the skin to continue killing germs even after washing.    Oral Hygiene is also important to reduce your risk of infection.  Remember - BRUSH YOUR TEETH THE MORNING OF SURGERY WITH YOUR REGULAR TOOTHPASTE  Please do not use if you have an allergy to CHG or antibacterial soaps. If your skin becomes reddened/irritated stop using the CHG.  Do not shave (including legs and underarms) for at least 48 hours prior to first CHG shower. It is OK to shave your face.  Please follow these instructions carefully.   1. Shower the NIGHT BEFORE SURGERY and the MORNING OF SURGERY with CHG Soap.   2. If you chose to wash your hair, wash your hair first as usual with your normal shampoo.  3. After you shampoo, rinse your hair and  body thoroughly to remove the shampoo.  4. Use CHG as you would any other liquid soap. You can apply CHG directly to the skin and wash gently with a scrungie or a clean washcloth.   5. Apply the CHG Soap to your body ONLY FROM THE NECK DOWN.  Do not use on open wounds or open sores. Avoid contact with your eyes, ears, mouth and genitals (private parts). Wash Face and genitals (private parts)  with your normal soap.   6. Wash thoroughly, paying special attention to the area where your surgery will be performed.  7. Thoroughly rinse your body with warm water from the neck  down.  8. DO NOT shower/wash with your normal soap after using and rinsing off the CHG Soap.  9. Pat yourself dry with a CLEAN TOWEL.  10. Wear CLEAN PAJAMAS to bed the night before surgery, wear comfortable clothes the morning of surgery  11. Place CLEAN SHEETS on your bed the night of your first shower and DO NOT SLEEP WITH PETS.    Day of Surgery:  Do not apply any deodorants/lotions. Please shower the morning of surgery with the CHG soap  Please wear clean clothes to the hospital/surgery center.   Remember to brush your teeth WITH YOUR REGULAR TOOTHPASTE.   Please read over the  fact sheets that you were given.

## 2019-02-26 ENCOUNTER — Other Ambulatory Visit (HOSPITAL_COMMUNITY)
Admission: RE | Admit: 2019-02-26 | Discharge: 2019-02-26 | Disposition: A | Discharge status: Home / Self Care | Payer: BC Managed Care – PPO | Source: Ambulatory Visit | Attending: Surgery | Admitting: Surgery

## 2019-02-26 ENCOUNTER — Ambulatory Visit: Payer: Self-pay | Admitting: Plastic Surgery

## 2019-02-26 ENCOUNTER — Encounter (HOSPITAL_COMMUNITY): Payer: Self-pay

## 2019-02-26 ENCOUNTER — Other Ambulatory Visit: Payer: Self-pay

## 2019-02-26 ENCOUNTER — Encounter (HOSPITAL_COMMUNITY)
Admission: RE | Admit: 2019-02-26 | Discharge: 2019-02-26 | Disposition: A | Discharge status: Home / Self Care | Payer: BC Managed Care – PPO | Source: Ambulatory Visit | Attending: Surgery | Admitting: Surgery

## 2019-02-26 DIAGNOSIS — Z01818 Encounter for other preprocedural examination: Secondary | ICD-10-CM | POA: Insufficient documentation

## 2019-02-26 DIAGNOSIS — Z01812 Encounter for preprocedural laboratory examination: Secondary | ICD-10-CM | POA: Insufficient documentation

## 2019-02-26 DIAGNOSIS — Z20828 Contact with and (suspected) exposure to other viral communicable diseases: Secondary | ICD-10-CM | POA: Insufficient documentation

## 2019-02-26 DIAGNOSIS — Z01810 Encounter for preprocedural cardiovascular examination: Secondary | ICD-10-CM | POA: Insufficient documentation

## 2019-02-26 HISTORY — DX: Hyperlipidemia, unspecified: E78.5

## 2019-02-26 HISTORY — DX: Prediabetes: R73.03

## 2019-02-26 LAB — BASIC METABOLIC PANEL
Anion gap: 8 (ref 5–15)
BUN: 12 mg/dL (ref 8–23)
CO2: 25 mmol/L (ref 22–32)
Calcium: 9.1 mg/dL (ref 8.9–10.3)
Chloride: 105 mmol/L (ref 98–111)
Creatinine, Ser: 0.75 mg/dL (ref 0.44–1.00)
GFR calc Af Amer: >60 mL/min (ref >60)
GFR calc non Af Amer: >60 mL/min (ref >60)
Glucose, Bld: 102 mg/dL — ABNORMAL HIGH (ref 70–99)
Potassium: 3.8 mmol/L (ref 3.5–5.1)
Sodium: 138 mmol/L (ref 135–145)

## 2019-02-26 LAB — CBC
HCT: 41.3 % (ref 36.0–46.0)
Hemoglobin: 13.1 g/dL (ref 12.0–15.0)
MCH: 26.7 pg (ref 26.0–34.0)
MCHC: 31.7 g/dL (ref 30.0–36.0)
MCV: 84.3 fL (ref 80.0–100.0)
Platelets: 315 10*3/uL (ref 150–400)
RBC: 4.9 MIL/uL (ref 3.87–5.11)
RDW: 14.1 % (ref 11.5–15.5)
WBC: 4.6 10*3/uL (ref 4.0–10.5)
nRBC: 0 % (ref 0.0–0.2)

## 2019-02-27 LAB — NOVEL CORONAVIRUS, NAA (HOSP ORDER, SEND-OUT TO REF LAB; TAT 18-24 HRS): SARS-CoV-2, NAA: NOT DETECTED

## 2019-02-28 MED ORDER — BUPIVACAINE LIPOSOME 1.3 % IJ SUSP
20.0000 mL | INTRAMUSCULAR | Status: DC
  Filled 2019-02-28: qty 20

## 2019-02-28 NOTE — H&P (Signed)
[name]  Location: [facility] Patient #: [phone] DOB: 23-Mar-1956 Divorced / Language: English / Race: Black or African American Female  History of Present Illness   The patient is a 63 year old female who presents with a complaint of breast mass.  The PCP is Dr. Hunt Oris  She comes by herself.  I last saw [name] in May 2019. She saw Dr. Jana Hakim in 05/18/2018. Her genetic tests were negative.  She has two things going on: 1) recurrent left breast cancer and 2) needing some management of her lap band  Ms. Will has tried to control her recurrent breast cancer with antihormone therapy. However, on a mammogram at [facility] on 20 March 2018, there appeared to be slight enlargement of the changes in her left breast. The area of concern had gone from 2.4 x 2.9 cm in November 2018 to 3.2 x 3.3 cm in November 2019. She is now open to the idea of a left mastectomy. I discussed the option of a lumpectomy alone in her left breast. But the left breast is smaller and already distorted from the prior surgery and I think should be best served with a left mastectomy. She is interested in breast reconstruction. She had made seen Dr. Iran Planas, but was not happy with the interview.   Addendum Note(Vershawn Westrup H. Lucia Gaskins MD; 12/20/2018 3:41 PM)   She saw Dr. Harlow Mares. He and I talked on the phone. He saw her recently and offered either immediate or delayed reconstruction.   We have not heard from [name] what she wants to do. The patient is looking at retiring on her birthday on 07 February 2018. She wants to aim for her surgery to be done at the end of August or beginning of September. With this much time, we are to schedule the surgery to her needs. I did encourage her to continue her antihormone pills until surgery.  Plan: 1) Left mastectomy with left axillary SLNBx, 2) Left breast reconstruction - she has seen Dr. Harlow Mares, 3) Consider right breast  reduction mammoplasty  Past Medical History: 1. Left breast cancer. T1c, N0. Left breast biopsy on 10/26/2010 which showed a high grade IDC, ER - 98%, PR - 13%, Ki67 - 69%. MRI done 01/30/2011 showed a 1.8 x 1.2 cm central left breast lesion. Left breast lumpectomy and left sentinel lymph node axillary node biopsy on 12/10/2010. 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 She has 0 of 2 nodes involved. Her tumor is ER and PR receptor positive, Ki-67 is 13%, and her HER-2/neu is positive. The signal was 2.3. She had an Oncotype which was 43. The distant recurrence risk was 29%. Finished herceptin in Jan 2014. She finished rad tx January 2013. Treating oncologist - Magrinat/Murray  1A. Recurrent left breast cancer Mammogram at [facility] She underwent a right breast biopsy on 04/05/2017 [phone]) which showed high grade DCIS, microscopic focus. She is currently on "neoadjuvant" anastrazole.  2. Lap Band, APS - B. Hoxworth - 05/26/2010 She last had fluid added to her lap band in 2014. UGI on 10/25/2017 shows mildly dilated esophagus.  3. Hypertension 4. Borderline DM 5. Power port - right subclavian - D. Mannie Wineland - 02/11/2011 Removed 06/26/2012 6. Trouble with both feet She has a "broken" bone in her right foot. She is supposed to wear a boot, but has not started wearing it yet 7. 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  I operated on her [place].   Allergies [facility] CMA; 10/26/2018 10:54 AM) No Known Drug Allergies [04/14/2017]: Allergies Reconciled   Medication History Nance Pew, CMA; 10/26/2018 10:54 AM) Anastrozole (1MG Tablet, Oral) Active. Albuterol Sulfate (108 (90 Base)MCG/ACT Aero Pow Br Act, Inhalation)  Active. Azor (10-40MG Tablet, Oral) Active. Multiple Vitamin (Oral) Active. Medications Reconciled  Vitals (Sabrina Canty CMA; 10/26/2018 10:55 AM) 10/26/2018 10:54 AM Weight: 227.8 lb Height: 60in Body Surface Area: 1.97 m Body Mass Index: 44.49 kg/m  Temp.: 74F(Temporal)  Pulse: 98 (Regular)  BP: 122/84 (Sitting, Left Arm, Standard)   Physical Exam  General: Obese AA F alert and generally healthy appearing. She is wearing a mask. HEENT: Normal. Pupils equal.  Neck: Supple. No mass. No thyroid mass.  Lymph Nodes: No supraclavicular, cervical or axillary nodes.  Lungs: Clear to auscultation and symmetric breath sounds. Heart: RRR. No murmur or rub.  Breast: Right - some soreness in the medial right breast (around 3 o'clock)  Left - scar from recent biopsy at 12 o'clock. Scar deformity with loss of breast tissue in the LIQ of the left breast.  Abdomen: Soft. No mass. No tenderness. No hernia. Normal bowel sounds.  Port in [place]. Rectal: Not done.  Extremities: Good strength and ROM in upper and lower extremities.  Neurologic: Grossly intact to motor and sensory function. Psychiatric: Has normal mood and affect. Behavior is normal.    Assessment & Plan  1. BREAST CANCER, STAGE 0, LEFT   Mammogram at [facility]: Right breast biopsy on 04/05/2017 [phone]) which showed high grade DCIS, microscopic focus.  She is currently on "neoadjuvant" anastrazole.  Plan:   1) Left mastectomy with left axillary SLNBx   2) Left breast reconstruction - Dr. Baruch Goldmann   Addendum Note(Jenai Scaletta H. Lucia Gaskins MD; 12/20/2018 3:41 PM) She saw Dr. Harlow Mares. He and I talked on the phone. He saw her recently and offered either immediate or delayed reconstruction. We have not heard from [name] what she wants to do.    Addendum Note(Declyn Delsol H. Majesty Stehlin MD; 01/15/2019 3:34 PM) I finally got in touch with her after several attempts. She has a surgery date for  03/01/2019. She is going to get a latissimus flap. She seems comfortable with the decision.   3) Consider right breast reduction mammoplasty   2.  HISTORY OF LEFT BREAST CANCER (Z85.3)  Story: Left breast biopsy on 10/26/2010 which showed a high grade IDC, ER - 98%, PR - 13%, Ki67 - 69%.  Left breast lumpectomy and left sentinel lymph node axillary node biopsy on 12/10/2010.   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 She has 0 of 2 nodes involved. Her tumor is ER and PR receptor positive, Ki-67 is 13%, and her HER-2/neu is positive. The signal was 2.3. She had an Oncotype which was 43. The distant recurrence risk was 29%. Finished herceptin in Jan 2014. She finished rad tx January 2013.  Treating oncologist - Magrinat/Murray 3.  HISTORY OF LAPAROSCOPIC ADJUSTABLE GASTRIC BANDING (Z98.84)  Story: UGI on 10/25/2017 shows mildly dilated esophagus.  4. Lap Band, APS - B. Hoxworth - 05/26/2010 She last had fluid added to her lap band in 2014. UGI on 10/25/2017 shows mildly dilated esophagus.  3. Hypertension 4. Borderline DM 5. Genetic testing in Feb 2020 - negative   Alphonsa Overall, MD, [facility] Office phone:  [phone]

## 2019-03-01 ENCOUNTER — Encounter (HOSPITAL_COMMUNITY)
Admission: RE | Admit: 2019-03-01 | Discharge: 2019-03-01 | Disposition: A | Discharge status: Home / Self Care | Payer: BC Managed Care – PPO | Source: Ambulatory Visit | Attending: Surgery | Admitting: Surgery

## 2019-03-01 ENCOUNTER — Ambulatory Visit (HOSPITAL_COMMUNITY): Payer: BC Managed Care – PPO | Admitting: Certified Registered Nurse Anesthetist

## 2019-03-01 ENCOUNTER — Encounter (HOSPITAL_COMMUNITY)
Admission: AD | Disposition: A | Discharge status: Home / Self Care | Payer: Self-pay | Source: Home / Self Care | Attending: Plastic Surgery

## 2019-03-01 ENCOUNTER — Observation Stay (HOSPITAL_COMMUNITY)
Admission: AD | Admit: 2019-03-01 | Discharge: 2019-03-02 | Disposition: A | Discharge status: Home / Self Care | Payer: BC Managed Care – PPO | Attending: Surgery | Admitting: Surgery

## 2019-03-01 ENCOUNTER — Encounter (HOSPITAL_COMMUNITY): Payer: Self-pay

## 2019-03-01 ENCOUNTER — Other Ambulatory Visit: Payer: Self-pay

## 2019-03-01 DIAGNOSIS — D0592 Unspecified type of carcinoma in situ of left breast: Secondary | ICD-10-CM

## 2019-03-01 DIAGNOSIS — R7303 Prediabetes: Secondary | ICD-10-CM | POA: Insufficient documentation

## 2019-03-01 DIAGNOSIS — Z87891 Personal history of nicotine dependence: Secondary | ICD-10-CM | POA: Insufficient documentation

## 2019-03-01 DIAGNOSIS — Z79899 Other long term (current) drug therapy: Secondary | ICD-10-CM | POA: Insufficient documentation

## 2019-03-01 DIAGNOSIS — Z923 Personal history of irradiation: Secondary | ICD-10-CM | POA: Diagnosis not present

## 2019-03-01 DIAGNOSIS — I10 Essential (primary) hypertension: Secondary | ICD-10-CM | POA: Diagnosis not present

## 2019-03-01 DIAGNOSIS — Z6841 Body Mass Index (BMI) 40.0 and over, adult: Secondary | ICD-10-CM | POA: Insufficient documentation

## 2019-03-01 DIAGNOSIS — Z9884 Bariatric surgery status: Secondary | ICD-10-CM | POA: Diagnosis not present

## 2019-03-01 DIAGNOSIS — G4730 Sleep apnea, unspecified: Secondary | ICD-10-CM | POA: Insufficient documentation

## 2019-03-01 DIAGNOSIS — C50512 Malignant neoplasm of lower-outer quadrant of left female breast: Secondary | ICD-10-CM | POA: Diagnosis present

## 2019-03-01 DIAGNOSIS — Z79811 Long term (current) use of aromatase inhibitors: Secondary | ICD-10-CM | POA: Insufficient documentation

## 2019-03-01 DIAGNOSIS — C50912 Malignant neoplasm of unspecified site of left female breast: Secondary | ICD-10-CM | POA: Diagnosis present

## 2019-03-01 HISTORY — PX: LATISSIMUS FLAP TO BREAST: SHX5357

## 2019-03-01 HISTORY — PX: PLACEMENT OF BREAST IMPLANTS: SHX6334

## 2019-03-01 HISTORY — PX: MASTECTOMY W/ SENTINEL NODE BIOPSY: SHX2001

## 2019-03-01 SURGERY — MASTECTOMY WITH SENTINEL LYMPH NODE BIOPSY
Anesthesia: General | Site: Breast | Laterality: Left

## 2019-03-01 MED ORDER — DEXTROSE-NACL 5-0.45 % IV SOLN
INTRAVENOUS | Status: DC
  Administered 2019-03-01: 125 mL via INTRAVENOUS

## 2019-03-01 MED ORDER — TECHNETIUM TC 99M SULFUR COLLOID FILTERED
1.0000 | Freq: Once | INTRAVENOUS | Status: AC | PRN
  Administered 2019-03-01: 1 via INTRADERMAL

## 2019-03-01 MED ORDER — HEPARIN SODIUM (PORCINE) 5000 UNIT/ML IJ SOLN
5000.0000 [IU] | Freq: Once | INTRAMUSCULAR | Status: AC
  Administered 2019-03-01: 5000 [IU] via SUBCUTANEOUS
  Filled 2019-03-01: qty 1

## 2019-03-01 MED ORDER — MIDAZOLAM HCL 2 MG/2ML IJ SOLN
INTRAMUSCULAR | Status: AC
  Filled 2019-03-01: qty 2

## 2019-03-01 MED ORDER — ROPIVACAINE HCL 5 MG/ML IJ SOLN
INTRAMUSCULAR | Status: DC | PRN
  Administered 2019-03-01: 30 mL via PERINEURAL

## 2019-03-01 MED ORDER — CHLORHEXIDINE GLUCONATE 4 % EX LIQD: 60.0000 mL | Freq: Once | CUTANEOUS | Status: DC

## 2019-03-01 MED ORDER — MIDAZOLAM HCL 2 MG/2ML IJ SOLN
INTRAMUSCULAR | Status: DC | PRN
  Administered 2019-03-01 (×2): 1 mg via INTRAVENOUS

## 2019-03-01 MED ORDER — SODIUM CHLORIDE (PF) 0.9 % IJ SOLN
INTRAVENOUS | Status: DC | PRN
  Administered 2019-03-01: .5 mL via INTRAMUSCULAR

## 2019-03-01 MED ORDER — FENTANYL CITRATE (PF) 250 MCG/5ML IJ SOLN
INTRAMUSCULAR | Status: AC
  Filled 2019-03-01: qty 5

## 2019-03-01 MED ORDER — CHLORHEXIDINE GLUCONATE CLOTH 2 % EX PADS
6.0000 | MEDICATED_PAD | Freq: Every day | CUTANEOUS | Status: DC
  Administered 2019-03-01 – 2019-03-02 (×2): 6 via TOPICAL

## 2019-03-01 MED ORDER — SODIUM CHLORIDE 0.9 % IV SOLN
INTRAVENOUS | Status: DC | PRN
  Administered 2019-03-01: 1000 mL

## 2019-03-01 MED ORDER — PROMETHAZINE HCL 25 MG/ML IJ SOLN: 6.2500 mg | INTRAMUSCULAR | Status: DC | PRN

## 2019-03-01 MED ORDER — CEFAZOLIN SODIUM-DEXTROSE 1-4 GM/50ML-% IV SOLN
1.0000 g | Freq: Three times a day (TID) | INTRAVENOUS | Status: DC
  Administered 2019-03-01 – 2019-03-02 (×2): 1 g via INTRAVENOUS
  Filled 2019-03-01 (×3): qty 50

## 2019-03-01 MED ORDER — ACETAMINOPHEN 500 MG PO TABS
1000.0000 mg | ORAL_TABLET | ORAL | Status: AC
  Administered 2019-03-01: 1000 mg via ORAL
  Filled 2019-03-01: qty 2

## 2019-03-01 MED ORDER — ALBUMIN HUMAN 5 % IV SOLN
INTRAVENOUS | Status: DC | PRN
  Administered 2019-03-01: 12:00:00 via INTRAVENOUS

## 2019-03-01 MED ORDER — SODIUM CHLORIDE 0.9 % IV SOLN
Freq: Once | INTRAVENOUS | Status: DC
  Filled 2019-03-01: qty 1

## 2019-03-01 MED ORDER — SUGAMMADEX SODIUM 200 MG/2ML IV SOLN
INTRAVENOUS | Status: DC | PRN
  Administered 2019-03-01: 200 mg via INTRAVENOUS

## 2019-03-01 MED ORDER — AMLODIPINE BESYLATE 10 MG PO TABS
10.0000 mg | ORAL_TABLET | Freq: Every day | ORAL | Status: DC
  Administered 2019-03-01: 10 mg via ORAL
  Filled 2019-03-01: qty 1

## 2019-03-01 MED ORDER — ACETAMINOPHEN 325 MG PO TABS: 650.0000 mg | ORAL_TABLET | Freq: Four times a day (QID) | ORAL | Status: DC | PRN

## 2019-03-01 MED ORDER — 0.9 % SODIUM CHLORIDE (POUR BTL) OPTIME
TOPICAL | Status: DC | PRN
  Administered 2019-03-01: 2000 mL

## 2019-03-01 MED ORDER — MEPERIDINE HCL 25 MG/ML IJ SOLN: 6.2500 mg | INTRAMUSCULAR | Status: DC | PRN

## 2019-03-01 MED ORDER — STERILE WATER FOR IRRIGATION IR SOLN
Status: DC | PRN
  Administered 2019-03-01: 1000 mL

## 2019-03-01 MED ORDER — HYDROMORPHONE HCL 2 MG PO TABS
2.0000 mg | ORAL_TABLET | ORAL | Status: DC | PRN
  Administered 2019-03-01 – 2019-03-02 (×4): 2 mg via ORAL
  Filled 2019-03-01 (×4): qty 1

## 2019-03-01 MED ORDER — HYDROMORPHONE HCL 1 MG/ML IJ SOLN
INTRAMUSCULAR | Status: AC
  Filled 2019-03-01: qty 1

## 2019-03-01 MED ORDER — CELECOXIB 200 MG PO CAPS
200.0000 mg | ORAL_CAPSULE | ORAL | Status: AC
  Administered 2019-03-01: 200 mg via ORAL
  Filled 2019-03-01: qty 1

## 2019-03-01 MED ORDER — LIDOCAINE 2% (20 MG/ML) 5 ML SYRINGE
INTRAMUSCULAR | Status: DC | PRN
  Administered 2019-03-01: 60 mg via INTRAVENOUS

## 2019-03-01 MED ORDER — DOCUSATE SODIUM 100 MG PO CAPS
100.0000 mg | ORAL_CAPSULE | Freq: Every day | ORAL | Status: DC
  Administered 2019-03-01 – 2019-03-02 (×2): 100 mg via ORAL
  Filled 2019-03-01: qty 1

## 2019-03-01 MED ORDER — CEFAZOLIN SODIUM-DEXTROSE 2-4 GM/100ML-% IV SOLN
2.0000 g | INTRAVENOUS | Status: AC
  Administered 2019-03-01 (×2): 2 g via INTRAVENOUS
  Filled 2019-03-01: qty 100

## 2019-03-01 MED ORDER — AMLODIPINE-OLMESARTAN 10-40 MG PO TABS: 1.0000 | ORAL_TABLET | Freq: Every day | ORAL | Status: DC

## 2019-03-01 MED ORDER — METHOCARBAMOL 500 MG PO TABS
ORAL_TABLET | ORAL | Status: AC
  Filled 2019-03-01: qty 1

## 2019-03-01 MED ORDER — METHYLENE BLUE 0.5 % INJ SOLN
INTRAVENOUS | Status: AC
  Filled 2019-03-01: qty 10

## 2019-03-01 MED ORDER — CHLORHEXIDINE GLUCONATE CLOTH 2 % EX PADS: 6.0000 | MEDICATED_PAD | Freq: Once | CUTANEOUS | Status: DC

## 2019-03-01 MED ORDER — OXYCODONE HCL 5 MG/5ML PO SOLN: 5.0000 mg | Freq: Once | ORAL | Status: DC | PRN

## 2019-03-01 MED ORDER — IRBESARTAN 300 MG PO TABS
300.0000 mg | ORAL_TABLET | Freq: Every day | ORAL | Status: DC
  Administered 2019-03-01: 300 mg via ORAL
  Filled 2019-03-01 (×2): qty 1

## 2019-03-01 MED ORDER — PROPOFOL 10 MG/ML IV BOLUS
INTRAVENOUS | Status: DC | PRN
  Administered 2019-03-01: 150 mg via INTRAVENOUS

## 2019-03-01 MED ORDER — ROCURONIUM BROMIDE 10 MG/ML (PF) SYRINGE
PREFILLED_SYRINGE | INTRAVENOUS | Status: DC | PRN
  Administered 2019-03-01: 40 mg via INTRAVENOUS
  Administered 2019-03-01: 100 mg via INTRAVENOUS
  Administered 2019-03-01: 40 mg via INTRAVENOUS
  Administered 2019-03-01 (×2): 20 mg via INTRAVENOUS

## 2019-03-01 MED ORDER — PROPOFOL 10 MG/ML IV BOLUS
INTRAVENOUS | Status: AC
  Filled 2019-03-01: qty 20

## 2019-03-01 MED ORDER — DEXAMETHASONE SODIUM PHOSPHATE 10 MG/ML IJ SOLN
INTRAMUSCULAR | Status: DC | PRN
  Administered 2019-03-01: 10 mg via INTRAVENOUS

## 2019-03-01 MED ORDER — ONDANSETRON HCL 4 MG/2ML IJ SOLN
INTRAMUSCULAR | Status: DC | PRN
  Administered 2019-03-01: 4 mg via INTRAVENOUS

## 2019-03-01 MED ORDER — FENTANYL CITRATE (PF) 250 MCG/5ML IJ SOLN
INTRAMUSCULAR | Status: DC | PRN
  Administered 2019-03-01 (×6): 50 ug via INTRAVENOUS

## 2019-03-01 MED ORDER — POVIDONE-IODINE 10 % EX SOLN
CUTANEOUS | Status: DC | PRN
  Administered 2019-03-01: 1 via TOPICAL

## 2019-03-01 MED ORDER — LACTATED RINGERS IV SOLN
INTRAVENOUS | Status: DC | PRN
  Administered 2019-03-01: 07:00:00 via INTRAVENOUS

## 2019-03-01 MED ORDER — METHOCARBAMOL 500 MG PO TABS
500.0000 mg | ORAL_TABLET | Freq: Four times a day (QID) | ORAL | Status: DC | PRN
  Administered 2019-03-01 – 2019-03-02 (×4): 500 mg via ORAL
  Filled 2019-03-01 (×3): qty 1

## 2019-03-01 MED ORDER — BACITRACIN-NEOMYCIN-POLYMYXIN 400-5-5000 EX OINT
TOPICAL_OINTMENT | CUTANEOUS | Status: AC
  Filled 2019-03-01: qty 1

## 2019-03-01 MED ORDER — HYDROMORPHONE HCL 1 MG/ML IJ SOLN
0.2500 mg | INTRAMUSCULAR | Status: DC | PRN
  Administered 2019-03-01: 0.5 mg via INTRAVENOUS

## 2019-03-01 MED ORDER — LACTATED RINGERS IV SOLN
INTRAVENOUS | Status: DC | PRN
  Administered 2019-03-01: 08:00:00 via INTRAVENOUS

## 2019-03-01 MED ORDER — OXYCODONE HCL 5 MG PO TABS: 5.0000 mg | ORAL_TABLET | Freq: Once | ORAL | Status: DC | PRN

## 2019-03-01 MED ORDER — SODIUM CHLORIDE 0.9 % IV SOLN
INTRAVENOUS | Status: DC | PRN
  Administered 2019-03-01: 12:00:00 20 ug/min via INTRAVENOUS

## 2019-03-01 MED ORDER — GABAPENTIN 300 MG PO CAPS
300.0000 mg | ORAL_CAPSULE | ORAL | Status: AC
  Administered 2019-03-01: 300 mg via ORAL
  Filled 2019-03-01: qty 1

## 2019-03-01 SURGICAL SUPPLY — 94 items
APPLIER CLIP 9.375 MED OPEN (MISCELLANEOUS) ×6
ATCH SMKEVC FLXB CAUT HNDSWH (FILTER) ×2 IMPLANT
BAG DECANTER FOR FLEXI CONT (MISCELLANEOUS) ×3 IMPLANT
BINDER BREAST 3XL (GAUZE/BANDAGES/DRESSINGS) ×3 IMPLANT
BINDER BREAST LRG (GAUZE/BANDAGES/DRESSINGS) IMPLANT
BINDER BREAST XLRG (GAUZE/BANDAGES/DRESSINGS) IMPLANT
BINDER BREAST XXLRG (GAUZE/BANDAGES/DRESSINGS) IMPLANT
BIOPATCH RED 1 DISK 7.0 (GAUZE/BANDAGES/DRESSINGS) ×9 IMPLANT
BLADE SURG 15 STRL LF DISP TIS (BLADE) IMPLANT
BLADE SURG 15 STRL SS (BLADE)
CANISTER SUCT 3000ML PPV (MISCELLANEOUS) ×3 IMPLANT
CHLORAPREP W/TINT 26 (MISCELLANEOUS) ×6 IMPLANT
CLIP APPLIE 9.375 MED OPEN (MISCELLANEOUS) ×4 IMPLANT
CONT SPEC 4OZ CLIKSEAL STRL BL (MISCELLANEOUS) ×6 IMPLANT
COVER PROBE W GEL 5X96 (DRAPES) ×3 IMPLANT
COVER SURGICAL LIGHT HANDLE (MISCELLANEOUS) ×6 IMPLANT
COVER WAND RF STERILE (DRAPES) IMPLANT
DERMABOND ADVANCED (GAUZE/BANDAGES/DRESSINGS) ×4
DERMABOND ADVANCED .7 DNX12 (GAUZE/BANDAGES/DRESSINGS) ×8 IMPLANT
DRAIN CHANNEL 19F RND (DRAIN) ×9 IMPLANT
DRAPE CHEST BREAST 15X10 FENES (DRAPES) IMPLANT
DRAPE HALF SHEET 40X57 (DRAPES) ×18 IMPLANT
DRAPE INCISE 23X17 IOBAN STRL (DRAPES) ×1
DRAPE INCISE IOBAN 23X17 STRL (DRAPES) ×2 IMPLANT
DRAPE ORTHO SPLIT 77X108 STRL (DRAPES) ×4
DRAPE SURG 17X23 STRL (DRAPES) ×12 IMPLANT
DRAPE SURG ORHT 6 SPLT 77X108 (DRAPES) ×8 IMPLANT
DRAPE WARM FLUID 44X44 (DRAPES) ×3 IMPLANT
DRSG PAD ABDOMINAL 8X10 ST (GAUZE/BANDAGES/DRESSINGS) ×12 IMPLANT
DRSG SORBAVIEW 3.5X5-5/16 MED (GAUZE/BANDAGES/DRESSINGS) ×6 IMPLANT
DRSG TEGADERM 4X4.75 (GAUZE/BANDAGES/DRESSINGS) ×3 IMPLANT
ELECT BLADE 6.5 EXT (BLADE) ×3 IMPLANT
ELECT CAUTERY BLADE 6.4 (BLADE) ×6 IMPLANT
ELECT REM PT RETURN 9FT ADLT (ELECTROSURGICAL) ×9
ELECTRODE REM PT RTRN 9FT ADLT (ELECTROSURGICAL) ×6 IMPLANT
EVACUATOR SILICONE 100CC (DRAIN) ×6 IMPLANT
EVACUATOR SMOKE ACCUVAC VALLEY (FILTER) ×1
GAUZE SPONGE 4X4 12PLY STRL (GAUZE/BANDAGES/DRESSINGS) IMPLANT
GAUZE XEROFORM 5X9 LF (GAUZE/BANDAGES/DRESSINGS) IMPLANT
GLOVE BIO SURGEON STRL SZ7.5 (GLOVE) ×6 IMPLANT
GLOVE BIOGEL PI IND STRL 8 (GLOVE) ×4 IMPLANT
GLOVE BIOGEL PI INDICATOR 8 (GLOVE) ×2
GLOVE SURG SYN 7.5  E (GLOVE) ×1
GLOVE SURG SYN 7.5 E (GLOVE) ×2 IMPLANT
GOWN STRL REUS W/ TWL LRG LVL3 (GOWN DISPOSABLE) ×6 IMPLANT
GOWN STRL REUS W/ TWL XL LVL3 (GOWN DISPOSABLE) ×6 IMPLANT
GOWN STRL REUS W/TWL LRG LVL3 (GOWN DISPOSABLE) ×3
GOWN STRL REUS W/TWL XL LVL3 (GOWN DISPOSABLE) ×3
ILLUMINATOR WAVEGUIDE N/F (MISCELLANEOUS) IMPLANT
IMPL SALINE SM ROUND 375CC (Breast) ×2 IMPLANT
IMPLANT SALINE SM ROUND 375CC (Breast) ×3 IMPLANT
KIT BASIN OR (CUSTOM PROCEDURE TRAY) ×6 IMPLANT
KIT TURNOVER KIT B (KITS) ×6 IMPLANT
LIGHT WAVEGUIDE WIDE FLAT (MISCELLANEOUS) IMPLANT
MARKER SKIN DUAL TIP RULER LAB (MISCELLANEOUS) ×6 IMPLANT
NEEDLE 18GX1X1/2 (RX/OR ONLY) (NEEDLE) ×3 IMPLANT
NEEDLE FILTER BLUNT 18X 1/2SAF (NEEDLE) ×1
NEEDLE FILTER BLUNT 18X1 1/2 (NEEDLE) ×2 IMPLANT
NEEDLE HYPO 25GX1X1/2 BEV (NEEDLE) IMPLANT
NS IRRIG 1000ML POUR BTL (IV SOLUTION) ×9 IMPLANT
PACK GENERAL/GYN (CUSTOM PROCEDURE TRAY) ×6 IMPLANT
PAD ARMBOARD 7.5X6 YLW CONV (MISCELLANEOUS) ×9 IMPLANT
PENCIL BUTTON HOLSTER BLD 10FT (ELECTRODE) ×3 IMPLANT
PENCIL SMOKE EVACUATOR (MISCELLANEOUS) IMPLANT
PREFILTER EVAC NS 1 1/3-3/8IN (MISCELLANEOUS) ×3 IMPLANT
SET ASEPTIC TRANSFER (MISCELLANEOUS) ×3 IMPLANT
SIZER BREAST 375CC (SIZER) ×1
SIZER BRST P4-4.8XMDRT 375CC (SIZER) ×2 IMPLANT
SLEEVE SUCTION CATH 165 (SLEEVE) ×3 IMPLANT
SPECIMEN JAR X LARGE (MISCELLANEOUS) ×3 IMPLANT
SPONGE LAP 18X18 RF (DISPOSABLE) ×9 IMPLANT
STAPLER VISISTAT 35W (STAPLE) ×6 IMPLANT
SUT ETHILON 2 0 FS 18 (SUTURE) ×3 IMPLANT
SUT MNCRL AB 3-0 PS2 18 (SUTURE) ×6 IMPLANT
SUT MNCRL AB 4-0 PS2 18 (SUTURE) IMPLANT
SUT MON AB 2-0 CT1 36 (SUTURE) ×12 IMPLANT
SUT PDS AB 0 CT 36 (SUTURE) ×6 IMPLANT
SUT PDS AB 3-0 SH 27 (SUTURE) IMPLANT
SUT PROLENE 2 0 SH 30 (SUTURE) ×3 IMPLANT
SUT PROLENE 3 0 PS 1 (SUTURE) ×6 IMPLANT
SUT PROLENE 3 0 PS 2 (SUTURE) IMPLANT
SUT SILK 2 0 PERMA HAND 18 BK (SUTURE) ×3 IMPLANT
SUT VIC AB 3-0 FS2 27 (SUTURE) IMPLANT
SUT VIC AB 3-0 SH 18 (SUTURE) ×3 IMPLANT
SUT VIC AB 3-0 SH 8-18 (SUTURE) ×15 IMPLANT
SYR 50ML SLIP (SYRINGE) IMPLANT
SYR BULB IRRIGATION 50ML (SYRINGE) ×9 IMPLANT
SYR CONTROL 10ML LL (SYRINGE) IMPLANT
TOWEL GREEN STERILE (TOWEL DISPOSABLE) ×6 IMPLANT
TOWEL GREEN STERILE FF (TOWEL DISPOSABLE) ×6 IMPLANT
TRAY FOLEY MTR SLVR 14FR STAT (SET/KITS/TRAYS/PACK) ×3 IMPLANT
TRAY FOLEY W/BAG SLVR 16FR (SET/KITS/TRAYS/PACK) ×1
TRAY FOLEY W/BAG SLVR 16FR ST (SET/KITS/TRAYS/PACK) ×2 IMPLANT
TUBE CONNECTING 12X1/4 (SUCTIONS) ×9 IMPLANT

## 2019-03-01 NOTE — Transfer of Care (Signed)
Immediate Anesthesia Transfer of Care Note  Patient: KEYSHA CONTRERA  Procedure(s) Performed: LEFT MASTECTOMY WITH LEFT SENTINEL LYMPH NODE BIOPSY (Left Breast) LEFT BREAST LATIISSIMUS FLAP (Left Back) PLACEMENT OF SALINE BREAST IMPLANT (Left Breast)  Patient Location: PACU  Anesthesia Type:General and GA combined with regional for post-op pain  Level of Consciousness: drowsy and patient cooperative  Airway & Oxygen Therapy: Patient Spontanous Breathing and Patient connected to face mask oxygen  Post-op Assessment: Report given to RN, Post -op Vital signs reviewed and stable and Patient moving all extremities X 4  Post vital signs: Reviewed and stable  Last Vitals:  Vitals Value Taken Time  BP 137/91 03/01/19 1503  Temp    Pulse 73 03/01/19 1512  Resp 21 03/01/19 1512  SpO2 100 % 03/01/19 1512  Vitals shown include unvalidated device data.  Last Pain:  Vitals:   03/01/19 0618  TempSrc: Other (Comment)  PainSc:       Patients Stated Pain Goal: 0 ([id number])  Complications: No apparent anesthesia complications   Bair hugger on patient

## 2019-03-01 NOTE — Op Note (Signed)
03/01/2019  9:57 AM  PATIENT:  [name], 63 y.o., female, MRN: [id number]  PREOP DIAGNOSIS:  RECURRENT LEFT BREAST CANCER  POSTOP DIAGNOSIS:   Recurrent left breast cancer,(Tis, N0)  PROCEDURE:   Procedure(s): LEFT MASTECTOMY WITH LEFT SENTINEL LYMPH NODE BIOPSY (deep sentinel lymph node biopsy) - D. Lucia Gaskins LEFT BREAST LATIISSIMUS FLAP, PLACEMENT OF SALINE BREAST IMPLANT - Harlow Mares  SURGEON:   Alphonsa Overall, M.D.  ASSISTANT:   None  ANESTHESIA:  General  Anesthesiologist: Lynda Rainwater, MD CRNA: Imagene Riches, CRNA; Muqtasid, Wyatt Haste, CRNA  General  ASA:  3  EBL:  100  ml  BLOOD ADMINISTERED: none  DRAINS: per Dr. Harlow Mares  LOCAL MEDICATIONS USED:   left pectoral block  SPECIMEN:   Left breast (suture lateral), left axillary lymph nodes (counts 5, background - 0, not blue)  COUNTS CORRECT:  YES  INDICATIONS FOR PROCEDURE:  [name] is a 63 y.o. (DOB: 04-02-1956) AA female whose primary care physician is Marton Redwood, MD and comes for left mastectomy with reconstruction by Dr. Harlow Mares.   She underwent a left breast lumpecotmy and left axillary SLNBx in 10/16/2010 for a IDC of the left breast.  Her oncologist are Drs. Magrinat and Valere Dross.  She developed a recurrent left breast cancer on biopsy on 04/05/2017 which is DCIS.  She has undergone treatment with anastrazole, but the area of concern in left breast has enlarged.  So she now comes for mastectomy.  The indications and risks of the surgery were explained to the patient.  The risks include, but are not limited to, infection, bleeding, and nerve injury.   In the holding area, her left areola was injected with 1 millicurie of Technitium Sulfur Colloid.  OPERATIVE NOTE;  The patient was taken to room # 2 at [facility] where she underwent a general anesthesia  supervised by Anesthesiologist: Lynda Rainwater, MD CRNA: Imagene Riches, CRNA; Muqtasid, Wyatt Haste, CRNA. Both her breast and axilla  were prepped with ChloraPrep and sterilely draped.    A time-out and the surgical check list was reviewed.    I injected about 0.6 mL of 40% methylene blue around her left areola.   I made an elliptical incision including the areola in the left breast.  She had a scar at the 7 o'clock position from her prior lumpectomy.  I tried to include as much of this scar in the specimen as I could.  I developed skin flaps medially to the lateral edge of the sternum, inferiorly to the investing fascia of the rectus abdominus muscle, laterally to the anterior edge of the latissimus dorsi muscle, and superiorly to about 2 finger breaths below the clavicle.  The breast was reflected off the pectoralis muscle from medial to lateral.  The lateral attachments in the left axilla were divided and the breast removed.  A long suture was placed on the lateral aspect of the breast.  The breast did have a gritty feel to the tissues, consistent with the prior radiation therapy.   I dissected into the left axilla and found a possible deep sentinel lymph node.  The node had counts of 5 with a background count of 0.  The lymph node was not blue.  This was sent as a separate specimen.   I irrigated the wound with 2,000 cc of saline.  I placed an antibiotic soaked gauze in the wound and stapled the wound close.  Dr. Harlow Mares will retrieve this sponge when he does the reconstruction.  As this point, Dr. Harlow Mares scrubbed in and started the plastic surgery reconstruction of the left breast.  He will dictate this portion of the operation.  Alphonsa Overall, MD, [facility] Pager: [phone] Office phone:  [phone]

## 2019-03-01 NOTE — Anesthesia Procedure Notes (Signed)
Procedure Name: Intubation Date/Time: 03/01/2019 8:09 AM Performed by: Larene Beach, CRNA Pre-anesthesia Checklist: Patient identified, Emergency Drugs available, Suction available and Patient being monitored Patient Re-evaluated:Patient Re-evaluated prior to induction Oxygen Delivery Method: Circle system utilized Preoxygenation: Pre-oxygenation with 100% oxygen Induction Type: IV induction Ventilation: Oral airway inserted - appropriate to patient size and Two handed mask ventilation required Laryngoscope Size: Glidescope and 3 Grade View: Grade I Tube type: Oral Tube size: 7.0 mm Number of attempts: 1 Airway Equipment and Method: Stylet and Oral airway Placement Confirmation: ETT inserted through vocal cords under direct vision,  positive ETCO2 and breath sounds checked- equal and bilateral Secured at: 21 cm Tube secured with: Tape Dental Injury: Teeth and Oropharynx as per pre-operative assessment  Difficulty Due To: Difficult Airway- due to large tongue Comments: DL x1 by CRNA Grade 3 view with a Mac 3, Dr. Sabra Heck attempt DL x1 Grade 2 view, bougie used-placed in esophagus. Decision made to use to glidescope size 3, grade 1 view successful intubation by CRNA.

## 2019-03-01 NOTE — Brief Op Note (Signed)
03/01/2019  3:10 PM  PATIENT:  [name]  63 y.o. female  PRE-OPERATIVE DIAGNOSIS:  RECURRENT LEFT BREAST CANCER  POST-OPERATIVE DIAGNOSIS:  RECURRENT LEFT BREAST CANCER  PROCEDURE:  Procedure(s): LEFT MASTECTOMY WITH LEFT SENTINEL LYMPH NODE BIOPSY (Left) LEFT BREAST LATIISSIMUS FLAP (Left) PLACEMENT OF SALINE BREAST IMPLANT (Left)  SURGEON:  Surgeon(s) and Role: Panel 1:    Alphonsa Overall, MD - Primary Panel 2:    * Crissie Reese, MD - Primary  PHYSICIAN ASSISTANT:   ASSISTANTSRia Clock, RNFA   ANESTHESIA:   general  EBL:  70 mL   BLOOD ADMINISTERED:none  DRAINS: (3) Jackson-Pratt drain(s) with closed bulb suction in the back (2) and anterior chest (1)   LOCAL MEDICATIONS USED:  NONE  SPECIMEN:  No Specimen  DISPOSITION OF SPECIMEN:  N/A  COUNTS:  YES  TOURNIQUET:  * No tourniquets in log *  DICTATION: .Other Dictation: Dictation Number [id number]  PLAN OF CARE: Admit to inpatient   PATIENT DISPOSITION:  PACU - hemodynamically stable.   Delay start of Pharmacological VTE agent (>24hrs) due to surgical blood loss or risk of bleeding: yes

## 2019-03-01 NOTE — Interval H&P Note (Signed)
History and Physical Interval Note:  03/01/2019 7:28 AM  [name]  has presented today for surgery, with the diagnosis of RECURRENT LEFT BREAST CANCER.  The various methods of treatment have been discussed with the patient and family.   Her mother, Elba Barman, is here with her.  After consideration of risks, benefits and other options for treatment, the patient has consented to  Procedure(s): LEFT MASTECTOMY WITH LEFT SENTINEL LYMPH NODE BIOPSY (Left) LEFT BREAST LATIISSIMUS FLAP (Left) PLACEMENT OF SALINE BREAST IMPLANT (Left) as a surgical intervention.  The patient's history has been reviewed, patient examined, no change in status, stable for surgery.  I have reviewed the patient's chart and labs.  Questions were answered to the patient's satisfaction.     Shann Medal

## 2019-03-01 NOTE — Anesthesia Preprocedure Evaluation (Signed)
Anesthesia Evaluation  Patient identified by MRN, date of birth, ID band Patient awake    Reviewed: Allergy & Precautions, NPO status , Patient's Chart, lab work & pertinent test results  Airway Mallampati: II  TM Distance: >3 FB Neck ROM: Full    Dental no notable dental hx.    Pulmonary sleep apnea , former smoker,    Pulmonary exam normal breath sounds clear to auscultation       Cardiovascular hypertension, Pt. on medications negative cardio ROS Normal cardiovascular exam Rhythm:Regular Rate:Normal     Neuro/Psych negative neurological ROS  negative psych ROS   GI/Hepatic negative GI ROS, Neg liver ROS,   Endo/Other  Morbid obesity  Renal/GU negative Renal ROS  negative genitourinary   Musculoskeletal  (+) Arthritis , Osteoarthritis,    Abdominal (+) + obese,   Peds negative pediatric ROS (+)  Hematology negative hematology ROS (+)   Anesthesia Other Findings Recurrent Breast Cancer  Reproductive/Obstetrics negative OB ROS                             Anesthesia Physical Anesthesia Plan  ASA: III  Anesthesia Plan: General   Post-op Pain Management:  Regional for Post-op pain   Induction: Intravenous  PONV Risk Score and Plan: 3 and Ondansetron, Dexamethasone, Midazolam and Treatment may vary due to age or medical condition  Airway Management Planned: Oral ETT  Additional Equipment:   Intra-op Plan:   Post-operative Plan: Extubation in OR  Informed Consent: I have reviewed the patients History and Physical, chart, labs and discussed the procedure including the risks, benefits and alternatives for the proposed anesthesia with the patient or authorized representative who has indicated his/her understanding and acceptance.     Dental advisory given  Plan Discussed with: CRNA  Anesthesia Plan Comments:         Anesthesia Quick Evaluation

## 2019-03-01 NOTE — Anesthesia Procedure Notes (Signed)
Anesthesia Regional Block: Pectoralis block   Pre-Anesthetic Checklist: ,, timeout performed, Correct Patient, Correct Site, Correct Laterality, Correct Procedure, Correct Position, site marked, Risks and benefits discussed,  Surgical consent,  Pre-op evaluation,  At surgeon's request and post-op pain management  Laterality: Left  Prep: chloraprep       Needles:  Injection technique: Single-shot  Needle Type: Stimiplex     Needle Length: 9cm  Needle Gauge: 21     Additional Needles:   Procedures:,,,, ultrasound used (permanent image in chart),,,,  Narrative:  Start time: 03/01/2019 7:30 AM End time: 03/01/2019 7:35 AM Injection made incrementally with aspirations every 5 mL.  Performed by: Personally  Anesthesiologist: Lynda Rainwater, MD

## 2019-03-01 NOTE — Progress Notes (Signed)
Placed patient on home CPAP unit. Chamber filled with distilled H20 for humidity.

## 2019-03-02 ENCOUNTER — Encounter (HOSPITAL_COMMUNITY): Payer: Self-pay | Admitting: Surgery

## 2019-03-02 DIAGNOSIS — C50512 Malignant neoplasm of lower-outer quadrant of left female breast: Secondary | ICD-10-CM | POA: Diagnosis not present

## 2019-03-02 MED ORDER — ENOXAPARIN SODIUM 40 MG/0.4ML ~~LOC~~ SOLN
40.0000 mg | SUBCUTANEOUS | Status: DC
  Administered 2019-03-02: 40 mg via SUBCUTANEOUS
  Filled 2019-03-02: qty 0.4

## 2019-03-02 MED ORDER — DOXYCYCLINE HYCLATE 100 MG PO TABS
100.0000 mg | ORAL_TABLET | Freq: Two times a day (BID) | ORAL | Status: DC
  Administered 2019-03-02: 100 mg via ORAL
  Filled 2019-03-02: qty 1

## 2019-03-02 NOTE — Progress Notes (Signed)
Subjective: Sore but good pain control. States she may want to go home. Tolerating pos well.  Objective: Vital signs in last 24 hours: Temp:  [96.8 F (36 C)-98.8 F (37.1 C)] 98.5 F (36.9 C) (10/23 0510) Pulse Rate:  [72-101] 83 (10/23 0510) Resp:  [14-23] 18 (10/23 0510) BP: (106-154)/(59-96) 111/66 (10/23 0510) SpO2:  [91 %-100 %] 99 % (10/23 0510) Weight:  [104.2 kg] 104.2 kg (10/22 1837) Weight change: 1.007 kg    Intake/Output from previous day: 10/22 0701 - 10/23 0700 In: 3010 [P.O.:50; I.V.:2500; IV Piggyback:300] Out: [id number] [Urine:3915; Drains:630; Blood:70] Intake/Output this shift: Total I/O In: -  Out: 65 [Drains:45]  PE: Surgery sites look good. No evidence of bleeding or infection. Flap has excellent color. Flap is viable. Drains are functioning and drainage is thin.  Lab Results: No results for input(s): WBC, HGB, HCT, PLT in the last 72 hours. BMET No results for input(s): NA, K, CL, CO2, GLUCOSE, BUN, CREATININE, CALCIUM in the last 72 hours.  Studies/Results: Nm Sentinel Node Inj-no Rpt (breast)  Result Date: 03/01/2019 Sulfur colloid was injected by the nuclear medicine technologist for melanoma sentinel node.    Medications: I have reviewed the patient's current medications.  Assessment/Plan: Ambulate. D/C foley. Regular diet. D/C IV. PO antibiotic. DVT prophylaxis.  LOS: 1 day    Catharine, Analeya Luallen M 03/02/2019

## 2019-03-02 NOTE — Discharge Instructions (Addendum)
No lifting for 6 weeks No vigorous activity for 6 weeks (including outdoor walks) No driving for 4 weeks OK to walk up stairs slowly Stay propped up Use incentive spirometer at home every hour while awake No shower while drains are in place Empty drains at least three times a day and record the amounts separately Take an over-the-counter Probiotic while on antibiotics Take an over-the-counter stool softener (such as Colace) while on pain medication Begin Lovenox shots tomorrow at home. Do these at about the same time each day (morning). See Dr. Harlow Mares in his office next week. For questions call [phone] or [phone] (after hours and weekends)

## 2019-03-02 NOTE — Anesthesia Postprocedure Evaluation (Signed)
Anesthesia Post Note  Patient: [name]  Procedure(s) Performed: LEFT MASTECTOMY WITH LEFT SENTINEL LYMPH NODE BIOPSY (Left Breast) LEFT BREAST LATIISSIMUS FLAP (Left Back) PLACEMENT OF SALINE BREAST IMPLANT (Left Breast)     Patient location during evaluation: PACU Anesthesia Type: General Level of consciousness: awake and alert Pain management: pain level controlled Vital Signs Assessment: post-procedure vital signs reviewed and stable Respiratory status: spontaneous breathing, nonlabored ventilation, respiratory function stable and patient connected to nasal cannula oxygen Cardiovascular status: blood pressure returned to baseline and stable Postop Assessment: no apparent nausea or vomiting Anesthetic complications: no    Last Vitals:  Vitals:   03/02/19 0510 03/02/19 1111  BP: 111/66 109/75  Pulse: 83 82  Resp: 18 18  Temp: 36.9 C 37.3 C  SpO2: 99% 99%    Last Pain:  Vitals:   03/02/19 1111  TempSrc: Oral  PainSc:                  [name]

## 2019-03-02 NOTE — Discharge Summary (Signed)
Physician Discharge Summary  Patient ID: [name] MRN: [id number] DOB/AGE: [birth year] 63 y.o.  Admit date: 03/01/2019 Discharge date: 03/02/2019  Admission Diagnoses: Recurrent DCIS left breast                                         Radiation injury left chest wall  Discharge Diagnoses:  Active Problems:   Breast cancer, left [facility])   Discharged Condition: good  Hospital Course: Patient underwent left mastectomy, sentinel node, and breast reconstruction with latissimus flap and saline implant. She tolerated the procedure well. She is tolerating diet today and ambulating. Her foley was removed this morning. She would like to go home. If she voids well that should be fine.     Treatments: antibiotics: Ancef  Discharge Exam: Blood pressure 109/75, pulse 82, temperature 99.2 F (37.3 C), temperature source Oral, resp. rate 18, height 5' (1.524 m), weight 104.2 kg, last menstrual period 10/06/2010, SpO2 99 %. Surgical sites look good. Flap is viable.  Disposition: Discharge disposition: 01-Home or Self Care        Allergies as of 03/02/2019   No Known Allergies     Medication List    STOP taking these medications   ALIVE ONCE DAILY WOMENS PO   exemestane 25 MG tablet Commonly known as: AROMASIN     TAKE these medications   amLODipine-olmesartan 10-40 MG tablet Commonly known as: Azor Take 1 tablet by mouth daily. What changed: when to take this        Signed: Markies Mowatt M 03/02/2019, 12:05 PM

## 2019-03-02 NOTE — Progress Notes (Signed)
[name] to be D/C'd  per MD order. Discussed with the patient and all questions fully answered.  VSS, Skin clean, dry and intact without evidence of skin break down, no evidence of skin tears noted.  IV catheter discontinued intact. Site without signs and symptoms of complications. Dressing and pressure applied.  An After Visit Summary was printed and given to the patient. Patient received prescription.  D/c education completed with patient/family including follow up instructions, medication list, d/c activities limitations if indicated, with other d/c instructions as indicated by MD - patient able to verbalize understanding, all questions fully answered.   Patient instructed to return to ED, call 911, or call MD for any changes in condition.   Patient to be escorted via [place], and D/C home via private auto.

## 2019-03-02 NOTE — Op Note (Signed)
NAME: [name], SCHWIETERMAN MEDICAL RECORD [id number] ACCOUNT [id number] DATE OF BIRTH:1955/11/16 FACILITY: [facility] LOCATION: [facility] PHYSICIAN:Breck Hollinger Ann Held, MD  OPERATIVE REPORT  DATE OF PROCEDURE:  03/01/2019  PREOPERATIVE DIAGNOSIS:  Left breast cancer.  POSTOPERATIVE DIAGNOSIS:  Left breast cancer.  PROCEDURE PERFORMED:  Left immediate breast reconstruction with implant.  SURGEON:  Youlanda Roys. Harlow Mares, MD  ASSISTANT:  Ria Clock, RNFA  ANESTHESIA:  General.  ESTIMATED BLOOD LOSS:  75 mL.  DRAINS:  There were three 19-French; 1 for the anterior chest and 2 for the back.  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  She has now developed a recurrence, which is quite large, and a mastectomy was recommended by general surgery.  She did  desire breast reconstruction.  Due to her previous radiation, autologous tissue was felt to be indicated.  The latissimus flap with implant was discussed with her.  She selected a saline implant rather than silicone gel.  The nature of procedure and  risks were discussed.  Risks include but are not limited to bleeding, infection, healing problems, scarring, loss of sensation, fluid accumulations, anesthesia with complications, DVT, PE, loss of tissue, loss of the flap, asymmetry, contour deformities,  chronic pain, failure of the device, capsular contracture, displacement of device, wrinkles and ripples, and overall disappointment as well as loss of mobility and range of motion and strength, left shoulder and arm.  She understood all this, and all of  her questions were answered.  She wished to proceed.  DESCRIPTION OF PROCEDURE:  The patient was marked in the holding area for the flap.  In the operating room, general surgery completed the left mastectomy and sentinel lymph node biopsy, and the mastectomy wound had been closed temporarily with skin  staples with an  antibiotic-soaked lap in the space.  The temporary closure was performed with skin staples.  The mastectomy site was covered with a Steri-Drape.  The patient was then turned right lateral decubitus with axillary roll positioned and stabilized with a beanbag.  She was prepped with ChloraPrep and draped with sterile drapes.  The skin paddle was then incised, and the dissection was carried down to  the underlying muscle, beveling cephalad and caudad in order to include a broader attachment of subcutaneous tissue at the level of the muscle that was present at the level of the skin.  Dissection was then continued out to the periphery of the muscle,  medial, lateral, superior, and inferior, and the extended muscle was thus exposed.  A subcutaneous tunnel was then created through to the left anterior chest where the mastectomy space was located.  The muscle was then divided distally and then reflected  in a cephalad direction, and the dissection was continued using electrocautery.  Great care was taken to avoid damage to the pedicles of the flap.  Large perforating vessels were suture ligated with 3-0 silks and divided.  The flap was passed gently  through the subcutaneous tunnel, having noted that it did have bright red bleeding around the periphery of the skin paddle consistent with viability prior to passing it to the left chest.  The back was irrigated thoroughly with saline.  Meticulous  hemostasis was achieved with electrocautery.  Two 19-French drains were positioned and brought out through separate stab wounds inferiorly and secured with 3-0 Prolene sutures.  Excellent hemostasis having been confirmed, the closure was then performed  with 0 PDS interrupted inverted deep  sutures, followed by 2-0 Monocryl interrupted inverted deep dermal sutures and running 3-0 Monocryl subcuticular suture.  Dermabond, a dry sterile dressing, and a Steri-Drape were placed over this.  The drains were  dressed with Biopatches  and SorbaViews.  The patient was then turned supine.  The Steri-Drape was removed, and the surgical site was then prepped with Betadine and draped with sterile drapes.  Skin staples were removed.  The site was inspected and noted to have excellent color and bright red  bleeding at the periphery of the skin consistent with viability.  Thorough irrigation was performed with saline.  Antibiotic solution and Betadine were used to irrigate.  The flap was then inset medially and inferiorly using 3-0 Vicryl sutures after  first closing off the tunnel with 3-0 Vicryl sutures as well.  3-0 Vicryl sutures were preplaced and left intact superiorly.  A sizer was then placed after soaking it in antibiotic solution.  The 425 mL sizer, and it was then positioned and filled with  425 of sterile saline.  It was felt to be the appropriate size.  The sizer was removed, and the implant was brought to the field.  After thoroughly cleaning gloves, it was prepared.  The implant was soaked in antibiotic solution.  100 mL sterile saline  was placed using a closed filling system.  The implant was then positioned.  It was filled with a maximum of 425 mL using the closed filling system sterile saline.  The 3-0 Vicryl sutures were then tied securely.  All this was done after a 19-French  drain was positioned and brought through a separate stab wound inferomedial and secured with a 3-0 Prolene suture.  Skin paddle was then inset using 3-0 Monocryl interrupted inverted deep dermal sutures and 3-0 Monocryl running subcuticular suture as  needed.  The flap had excellent color and bright red bleeding at the periphery consistent with viability.  A Dermabond dry sterile dressing and the breast vest were then positioned, and she was transferred to the recovery room stable, having tolerated  the procedure well.  LN/NUANCE  D:03/01/2019 T:03/02/2019 JOB:[id number]

## 2019-03-02 NOTE — Progress Notes (Signed)
[facility]:  [phone] General Surgery Progress Note   LOS: 1 day  POD -  1 Day Post-Op  Chief Complaint: Left breast cancer  Assessment and Plan: 1.  LEFT MASTECTOMY WITH LEFT SENTINEL LYMPH NODE BIOPSY - Tramaine Snell,  LEFT BREAST LATIISSIMUS FLAP, PLACEMENT OF SALINE BREAST IMPLANT - Harlow Mares - 03/01/2019  For left breast cancer  Doing well - anticipates going home this afternoon.  2. History of Lap Band, APS - [facility]. [facility] - 05/26/2010 3. Hypertension 4. Borderline DM 5.  DVT prophylaxis - Lovenox   Active Problems:   Breast cancer, left (HCC)  Subjective:  In good spirits.  Her main pain is her left back (latissimus donor site).  Objective:   Vitals:   03/02/19 0220 03/02/19 0510  BP: (!) 114/59 111/66  Pulse: 85 83  Resp: 18 18  Temp: 98.7 F (37.1 C) 98.5 F (36.9 C)  SpO2: 97% 99%     Intake/Output from previous day:  10/22 0701 - 10/23 0700 In: 3010 [P.O.:50; I.V.:2500; IV Piggyback:300] Out: [id number] [Urine:3915; Drains:630; Blood:70]  Intake/Output this shift:  Total I/O In: -  Out: 34 [Drains:45]   Physical Exam:   General: Obese AA F who is alert and oriented.    HEENT: Normal. Pupils equal. .   Lungs: Clear   Wound: Looks good.  Drains -  1/2/3 - 210/315/105 cc last 24 hours   Lab Results:   No results for input(s): WBC, HGB, HCT, PLT in the last 72 hours.  BMET  No results for input(s): NA, K, CL, CO2, GLUCOSE, BUN, CREATININE, CALCIUM in the last 72 hours.  PT/INR  No results for input(s): LABPROT, INR in the last 72 hours.  ABG  No results for input(s): PHART, HCO3 in the last 72 hours.  Invalid input(s): PCO2, PO2   Studies/Results:  Nm Sentinel Node Inj-no Rpt (breast)  Result Date: 03/01/2019 Sulfur colloid was injected by the nuclear medicine technologist for melanoma sentinel node.     Anti-infectives:   Anti-infectives (From admission, onward)   Start     Dose/Rate Route Frequency Ordered Stop   03/02/19  1000  doxycycline (VIBRA-TABS) tablet 100 mg     100 mg Oral Every 12 hours 03/02/19 0902     03/01/19 2200  ceFAZolin (ANCEF) IVPB 1 g/50 mL premix  Status:  Discontinued     1 g 100 mL/hr over 30 Minutes Intravenous Every 8 hours 03/01/19 1808 03/02/19 0902   03/01/19 0857  bacitracin 50,000 Units, gentamicin (GARAMYCIN) 80 mg, ceFAZolin (ANCEF) 1 g in sodium chloride 0.9 % 1,000 mL  Status:  Discontinued       As needed 03/01/19 0858 03/01/19 1503   03/01/19 0745  bacitracin 50,000 Units, gentamicin (GARAMYCIN) 80 mg, ceFAZolin (ANCEF) 1 g in sodium chloride 0.9 % 1,000 mL  Status:  Discontinued      Irrigation Once 03/01/19 0730 03/01/19 1808   03/01/19 0600  ceFAZolin (ANCEF) IVPB 2g/100 mL premix     2 g 200 mL/hr over 30 Minutes Intravenous On call to O.R. 03/01/19 0547 03/01/19 [place], MD, [facility] Surgery Office: [phone] 03/02/2019

## 2019-03-07 ENCOUNTER — Telehealth: Payer: Self-pay | Admitting: Oncology

## 2019-03-07 LAB — SURGICAL PATHOLOGY

## 2019-03-07 NOTE — Telephone Encounter (Signed)
Scheduled appt per 10/28 sch message but cancelled pt per - unable to schedule at the date and time given due to patient schedule and transportation. Sent message to RN Dawn to let her know.

## 2019-03-08 ENCOUNTER — Telehealth: Payer: Self-pay | Admitting: Oncology

## 2019-03-08 NOTE — Telephone Encounter (Signed)
Scheduled appt per 10/28 sch message - pt unable to come in  , so I scheduled next available.   Called pt - unable to reach them or leave message . Mailed reminder letter with appt date and time

## 2019-03-15 ENCOUNTER — Other Ambulatory Visit: Payer: BC Managed Care – PPO

## 2019-03-15 ENCOUNTER — Ambulatory Visit: Payer: BC Managed Care – PPO | Admitting: Oncology

## 2019-03-16 ENCOUNTER — Telehealth: Payer: Self-pay | Admitting: Oncology

## 2019-03-16 NOTE — Telephone Encounter (Signed)
Patient called to reschedule 11/12 appointment, patient has a conflicting appointment that day and rescheduled to 12/21.  Message to provider.

## 2019-03-22 ENCOUNTER — Other Ambulatory Visit: Payer: BC Managed Care – PPO

## 2019-03-22 ENCOUNTER — Ambulatory Visit: Payer: BC Managed Care – PPO | Admitting: Oncology

## 2019-04-09 ENCOUNTER — Encounter: Payer: Self-pay | Admitting: Rehabilitation

## 2019-04-09 ENCOUNTER — Other Ambulatory Visit: Payer: Self-pay

## 2019-04-09 ENCOUNTER — Ambulatory Visit: Payer: BC Managed Care – PPO | Attending: Plastic Surgery | Admitting: Rehabilitation

## 2019-04-09 DIAGNOSIS — Z9012 Acquired absence of left breast and nipple: Secondary | ICD-10-CM

## 2019-04-09 DIAGNOSIS — M25612 Stiffness of left shoulder, not elsewhere classified: Secondary | ICD-10-CM | POA: Diagnosis present

## 2019-04-09 DIAGNOSIS — R293 Abnormal posture: Secondary | ICD-10-CM

## 2019-04-09 DIAGNOSIS — Z483 Aftercare following surgery for neoplasm: Secondary | ICD-10-CM | POA: Diagnosis present

## 2019-04-09 NOTE — Patient Instructions (Signed)
Access Code: [email or web address]  URL: [email or web address]  Date: 04/09/2019  Prepared by: Shan Levans   Exercises  Supine Shoulder Flexion Extension AAROM with Dowel - 10 reps - 5 seconds hold - 1-2x daily - 7x weekly  Supine Chest Stretch with Elbows Bent - 3 reps - 1 sets - 30-60seconds hold - 1x daily - 7x weekly  Standing Backward Shoulder Rolls - 10 reps - 1-2x daily - 7x weekly   Do exercises seated to start if still too uncomfortable to lay down

## 2019-04-09 NOTE — Therapy (Signed)
[place], [place], [address] Phone: [phone]   Fax:  [phone]  Physical Therapy Evaluation  Patient Details  Name: [name] MRN: [id number] Date of Birth: 18-Dec-1955 Referring Provider (PT): Dr. Harlow Mares   Encounter Date: 04/09/2019  PT End of Session - 04/09/19 2205    Visit Number  1    Number of Visits  12    Date for PT Re-Evaluation  05/21/19    PT Start Time  1300    PT Stop Time  1356    PT Time Calculation (min)  56 min    Activity Tolerance  Patient tolerated treatment well    Behavior During Therapy  [facility] for tasks assessed/performed       Past Medical History:  Diagnosis Date  . 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 Breast lump    left  . Cancer ([place])   . Family history of breast cancer 06/23/2018  . Family history of prostate cancer 06/23/2018  . Gum disease   . Hyperlipidemia    diet and exercise controlled, no med  . Hypertension   . Pre-diabetes    borderline - diet and exercise contolled, no meds  . Sleep apnea    uses CPAP nightly    Past Surgical History:  Procedure Laterality Date  . BREAST LUMPECTOMY  2012   left  . COLONOSCOPY    . FOOT MASS EXCISION    . LAPAROSCOPIC GASTRIC BANDING    . LATISSIMUS FLAP TO BREAST Left 03/01/2019   Procedure: LEFT BREAST LATIISSIMUS FLAP;  Surgeon: Crissie Reese, MD;  Location: [facility];  Service: Plastics;  Laterality: Left;  [name] MASS EXCISION     back  . MASTECTOMY W/ SENTINEL NODE BIOPSY Left 03/01/2019   Procedure: LEFT MASTECTOMY WITH LEFT SENTINEL LYMPH NODE BIOPSY;  Surgeon: Alphonsa Overall, MD;  Location: [facility];  Service: General;  Laterality: Left;  . PLACEMENT OF BREAST IMPLANTS Left 03/01/2019   Procedure: PLACEMENT OF SALINE BREAST IMPLANT;  Surgeon: Crissie Reese, MD;  Location: [facility];  Service: Plastics;  Laterality: Left;  . PORTACATH PLACEMENT  02/11/11  . PORTACATH  REMOVAL  MARCH 2014  . RESECTOSCOPIC POLYPECTOMY  2001   MYOMECTOMY  . SOFT TISSUE CYST EXCISION     back cyst  . VAGINAL MASS EXCISION      There were no vitals filed for this visit.   Subjective Assessment - 04/09/19 1309    Subjective  I still can't lie down.  I am not taking any pain meds.  i can't bend over and reach anything (wearing post op binder)    Pertinent History  Pt with left mastectomy with SLNB due to on 03/01/19 by Dr. Lucia Gaskins with latissimus flap and placement of saline breast implant by Dr. Harlow Mares from [facility].  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  Total of 9 lymph nodes taken out. PMH excellent.    Limitations  Lifting   doing hair   Patient Stated Goals  get back to doing hair randomly    Currently in Pain?  No/denies   Im just sore and tight lateral breast and into the chest        [facility] PT Assessment - 04/09/19 0001      Assessment   Medical Diagnosis  left breast cancer    Referring  Provider (PT)  Dr. Harlow Mares    Onset Date/Surgical Date  03/01/19    Hand Dominance  Right    Next MD Visit  04/18/19      Precautions   Precaution Comments  lymphedema risk, lat flap      Restrictions   Weight Bearing Restrictions  No      Balance Screen   Has the patient fallen in the past 6 months  No    Has the patient had a decrease in activity level because of a fear of falling?   No    Is the patient reluctant to leave their home because of a fear of falling?   No      Home Environment   Living Environment  Private residence    Living Arrangements  Other relatives    Available Help at Discharge  Friend(s)    Type of Home  House    Additional Comments  staying with a friend now to help her and avoid steps.  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  The implant sits more laterally with the edge evident and sore to the patient along the chest. Still wearing her binder      Sensation   Additional Comments  report no numbness      Coordination   Gross Motor Movements are Fluid and Coordinated  Yes      Posture/Postural Control   Posture/Postural Control  Postural limitations    Postural Limitations  Rounded Shoulders;Forward head;Increased thoracic kyphosis      ROM / Strength   AROM / PROM / Strength  AROM      AROM   AROM Assessment Site  Shoulder    Right/Left Shoulder  Right;Left    Right Shoulder Flexion  165 Degrees    Right Shoulder ABduction  160 Degrees    Right Shoulder Internal Rotation  90 Degrees    Right Shoulder External Rotation  95 Degrees    Right Shoulder Horizontal ABduction  20 Degrees    Left Shoulder Flexion  125 Degrees   pull   Left Shoulder ABduction  105 Degrees   pain and pulling   Left Shoulder Internal Rotation  90 Degrees    Left Shoulder External Rotation  90 Degrees    Left Shoulder Horizontal ABduction  2 Degrees   pain     Palpation   Palpation comment  edge of implant felt more lateral than most with it appearing also lateral most likely due to radiation previously. very tight medial to the lat flap incision      Transfers   Comments  unable to lay supine or sidelying and using grabber to bend forward, not driving yet.         LYMPHEDEMA/ONCOLOGY QUESTIONNAIRE - 04/09/19 1340      Type   Cancer Type  left breast cancer      Surgeries   Mastectomy Date  03/01/19    Lumpectomy Date  05/10/10    Saline Implant Reconstruction Date  03/01/19  Sentinel Lymph Node Biopsy Date  03/01/19    Number Lymph Nodes  Removed  9   total     Treatment   Active Chemotherapy Treatment  No    Past Chemotherapy Treatment  Yes    Active Radiation Treatment  No    Past Radiation Treatment  Yes    Past Hormone Therapy  Yes      What other symptoms do you have   Are you Having Heaviness or Tightness  No    Are you having Pain  No    Are you having pitting edema  No    Other Symptoms  does have swelling bil LEs and was educated on using her garments right away in the morning, walking, and continuing to work towards supine sleeping again as she is sleeping in the recliner      Lymphedema Assessments   Lymphedema Assessments  Upper extremities      Right Upper Extremity Lymphedema   15 cm Proximal to Olecranon Process  40.2 cm    10 cm Proximal to Olecranon Process  40.7 cm    Olecranon Process  31.3 cm    15 cm Proximal to Ulnar Styloid Process  32 cm    10 cm Proximal to Ulnar Styloid Process  26.7 cm    Just Proximal to Ulnar Styloid Process  18.5 cm    Across Hand at [employer]  20 cm    At [place] of 2nd Digit  6.2 cm    Other  6.2    Other  2945ml      Left Upper Extremity Lymphedema   15 cm Proximal to Olecranon Process  41.5 cm    10 cm Proximal to Olecranon Process  41 cm    Olecranon Process  31 cm    15 cm Proximal to Ulnar Styloid Process  30.5 cm    10 cm Proximal to Ulnar Styloid Process  25 cm    Just Proximal to Ulnar Styloid Process  17.5 cm    Across Hand at [employer]  20.2 cm    At [place] of 2nd Digit  6.5 cm    Other  2860ml             Objective measurements completed on examination: See above findings.      [facility] Adult PT Treatment/Exercise - 04/09/19 0001      Exercises   Exercises  Other Exercises    Other Exercises   pt given seated cane flexion working towards supine, butterfly stretch working towards supine, and scapular retractions/shoulder roll              PT Education - 04/09/19 2150    Education Details  lymphedema and risk reduction,  given lymphedema action plan handout, discussed POC and initial ROM, LE compression stockings, and driving    Person(s) Educated  Patient    Methods  Explanation;Demonstration;Verbal cues;Tactile cues;Handout    Comprehension  Verbalized understanding;Returned demonstration          PT Long Term Goals - 04/09/19 2201      PT LONG TERM GOAL #1   Title  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 without limitations    Time  6    Period  Weeks    Status  New      PT LONG TERM GOAL #2   Title  Pt will be able to pick up things from the floor without having to use  the grabber    Time  6    Period  Weeks    Status  New      PT LONG TERM GOAL #3   Title  Pt will improve left shoulder AROM to flexion: 155, and abduction : 155 to demonstrate improved mobility    Baseline  flex: 125, abd: 105    Time  6    Period  Weeks    Status  New      PT LONG TERM GOAL #4   Title  Pt will obtain appropriate post surgical garments as needed    Time  6    Period  Weeks    Status  New             Plan - 04/09/19 1336    Clinical Impression Statement  Pt presents almost 6 weeks post left latissimus flap with saline implant placement due to reoccurence of her left breast cancer.  Pt does not demonstrate signs of active lymphedema but the left UE is around 49ml larger with history of lymph node removal and radiation so she may need a compression sleeve in the future.  Pt is stil wearing her post op binder and has been having to sleep sitting up on the couch due to discomfort lying supine or sidelying.  Her left shoulder ROM is moderately limited with pull in the axilla.  Pt also presents with lateral breast/implant border discomfort, tightness and discomfort around the latissimus near the spine.  Pt is also having some pain in the left SIJ region that she may want to address when more mobile.    Personal Factors and Comorbidities  Age;Comorbidity 2    Comorbidities  history of radiation and lymph  node removal    Examination-Activity Limitations  Sleep;Bend;Reach Overhead    Examination-Participation Restrictions  Yard Work;Cleaning;Shop;Meal Prep    Stability/Clinical Decision Making  Stable/Uncomplicated    Clinical Decision Making  Low    Rehab Potential  Excellent    PT Frequency  2x / week    PT Duration  6 weeks    PT Treatment/Interventions  ADLs/Self Care Home Management;Moist Heat;Manual lymph drainage;Scar mobilization;Taping;Therapeutic exercise;Patient/family education;Manual techniques;Passive range of motion    PT Next Visit Plan  script back for bras? PROM of the left shoulder, AAROM, working on tolerance for supine positioning with propping as needed, pulleys, eventually scar massage as needed, progress shoulder ROM and strength and work on low back if needed    PT Home Exercise Plan  Access Code: [email or web address] , practicing supine laying    Recommended Other Services  bras with prescription sent to Dr. Harlow Mares also driving questions to Dr. Harlow Mares nurse    Consulted and Agree with Plan of Care  Patient       Patient will benefit from skilled therapeutic intervention in order to improve the following deficits and impairments:  Decreased skin integrity, Increased muscle spasms, Decreased range of motion, Decreased scar mobility, Pain, Postural dysfunction, Impaired UE functional use  Visit Diagnosis: H/O left mastectomy  Aftercare following surgery for neoplasm  Stiffness of left shoulder, not elsewhere classified  Abnormal posture     Problem List Patient Active Problem List   Diagnosis Date Noted  . Breast cancer, left ([place]) 03/01/2019  . Genetic testing 07/20/2018  . Family history of breast cancer 06/23/2018  . Family history of prostate cancer 06/23/2018  . Trochanteric bursitis, right hip 06/23/2017  . Unilateral primary osteoarthritis, right knee 06/23/2017  . Recurrent breast cancer,  left ([place]) 04/22/2017  . Ductal carcinoma in situ (DCIS) of left breast  04/22/2017  . Malignant neoplasm of overlapping sites of left breast in female, estrogen receptor positive ([place]) 02/09/2016  . History of breast cancer, left, T1, N0, Her2Neu postive. 12/31/2010  . Hx of laparoscopic gastric banding 12/07/2010  . Morbidly obese ([place]) 11/04/2010  . Hypertension 11/04/2010  . Wears glasses 11/04/2010  . Gum disease 11/04/2010  . Breast lump 11/04/2010    Shan Levans, PT 04/09/2019, 10:07 PM  [place], [place], [address] Phone: [phone]   Fax:  [phone]  Name: [name] MRN: [id number] Date of Birth: 18-Feb-1956

## 2019-04-16 ENCOUNTER — Encounter: Payer: Self-pay | Admitting: Rehabilitation

## 2019-04-16 ENCOUNTER — Ambulatory Visit: Payer: BC Managed Care – PPO | Attending: Plastic Surgery | Admitting: Rehabilitation

## 2019-04-16 ENCOUNTER — Other Ambulatory Visit: Payer: Self-pay

## 2019-04-16 DIAGNOSIS — Z483 Aftercare following surgery for neoplasm: Secondary | ICD-10-CM | POA: Diagnosis present

## 2019-04-16 DIAGNOSIS — M25612 Stiffness of left shoulder, not elsewhere classified: Secondary | ICD-10-CM | POA: Diagnosis present

## 2019-04-16 DIAGNOSIS — R293 Abnormal posture: Secondary | ICD-10-CM | POA: Insufficient documentation

## 2019-04-16 DIAGNOSIS — Z9012 Acquired absence of left breast and nipple: Secondary | ICD-10-CM

## 2019-04-16 NOTE — Therapy (Signed)
[place], [place], [address] Phone: [phone]   Fax:  [phone]  Physical Therapy Treatment  Patient Details  Name: [name] MRN: [id number] Date of Birth: May 03, 1956 Referring Provider (PT): Dr. Harlow Mares   Encounter Date: 04/16/2019  PT End of Session - 04/16/19 1721    Visit Number  2    Number of Visits  12    Date for PT Re-Evaluation  05/21/19    PT Start Time  1402    PT Stop Time  1455    PT Time Calculation (min)  53 min    Activity Tolerance  Patient tolerated treatment well    Behavior During Therapy  [facility] for tasks assessed/performed       Past Medical History:  Diagnosis Date  . 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 Breast lump    left  . Cancer ([place])   . Family history of breast cancer 06/23/2018  . Family history of prostate cancer 06/23/2018  . Gum disease   . Hyperlipidemia    diet and exercise controlled, no med  . Hypertension   . Pre-diabetes    borderline - diet and exercise contolled, no meds  . Sleep apnea    uses CPAP nightly    Past Surgical History:  Procedure Laterality Date  . BREAST LUMPECTOMY  2012   left  . COLONOSCOPY    . FOOT MASS EXCISION    . LAPAROSCOPIC GASTRIC BANDING    . LATISSIMUS FLAP TO BREAST Left 03/01/2019   Procedure: LEFT BREAST LATIISSIMUS FLAP;  Surgeon: Crissie Reese, MD;  Location: [place];  Service: Plastics;  Laterality: Left;  [name] MASS EXCISION     back  . MASTECTOMY W/ SENTINEL NODE BIOPSY Left 03/01/2019   Procedure: LEFT MASTECTOMY WITH LEFT SENTINEL LYMPH NODE BIOPSY;  Surgeon: Alphonsa Overall, MD;  Location: [place];  Service: General;  Laterality: Left;  . PLACEMENT OF BREAST IMPLANTS Left 03/01/2019   Procedure: PLACEMENT OF SALINE BREAST IMPLANT;  Surgeon: Crissie Reese, MD;  Location: [place];  Service: Plastics;  Laterality: Left;  . PORTACATH PLACEMENT  02/11/11  . PORTACATH REMOVAL   MARCH 2014  . RESECTOSCOPIC POLYPECTOMY  2001   MYOMECTOMY  . SOFT TISSUE CYST EXCISION     back cyst  . VAGINAL MASS EXCISION      There were no vitals filed for this visit.  Subjective Assessment - 04/16/19 1403    Subjective  I slept on my back last night and I can reach down I have more reachability.  I drove today.    Pertinent History  Pt with left mastectomy with SLNB due to on 03/01/19 by Dr. Lucia Gaskins with latissimus flap and placement of saline breast implant by Dr. Harlow Mares from [facility].  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  Total of 9 lymph nodes taken out. PMH excellent.    Currently in Pain?  No/denies   just tightness        [facility] PT Assessment - 04/16/19 0001      AROM   Left Shoulder Flexion  137 Degrees   pull axilla   Left Shoulder ABduction  126 Degrees   pull in the axlla                  [facility] Adult PT Treatment/Exercise - 04/16/19  0001      Exercises   Exercises  Shoulder;Lumbar      Lumbar Exercises: Stretches   Lower Trunk Rotation  3 reps;10 seconds    Lower Trunk Rotation Limitations  pt educated on this for low back pain and with left shoulder ER for shoulder stretch as well      Lumbar Exercises: Seated   Other Seated Lumbar Exercises  discussed ant and post pelvic tilting and trying this in 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 to see if this decreases any Rt posterior PSIS region pain      Shoulder Exercises: Standing   Other Standing Exercises  standing dowel flexion x 10 added to HEP with vcs for 1st performance    Other Standing Exercises  wall abduction x 10 with vcs and demo for 1st performance      Shoulder Exercises: ROM/Strengthening   Other ROM/Strengthening Exercises  supine dowel flexion with pt able to lie supine HOB elevated today x 10    Other ROM/Strengthening Exercises  butterfly stretch position easy for pt today so this was eliminated       Manual Therapy   Manual Therapy  Passive ROM    Manual therapy comments  pt wore a bra for the first time which was a champion sports bra with low support and the old abd pad tucked under the breast.  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 without limitations    Time  6    Period  Weeks    Status  New      PT LONG TERM GOAL #2   Title  Pt will be able to pick up things from the floor without having to use the grabber    Time  6    Period  Weeks    Status  New      PT LONG TERM GOAL #3   Title  Pt will improve left shoulder AROM to flexion: 155, and abduction : 155 to demonstrate improved mobility    Baseline  flex: 125, abd: 105    Time  6    Period  Weeks    Status  New      PT LONG TERM GOAL #4   Title  Pt will obtain appropriate post surgical garments as needed    Time  6    Period  Weeks    Status  New            Plan - 04/16/19 1721    Clinical Impression Statement  Pt much improved today since last week.  Pt is able to sleep in the bed which has improved her LE swelling and has decreased some of her pain.  She is no longer having to use her reacher and can move the arm more confidently.  She deicded not to have reconstruction surgery on the Rt breast with repair of the left for now due to [place] but may consider this in the future.  Pt has improved AROM significantly and  has no pain only some pulling.  Advanced her HEP today to include more AAROM and we discussed how to try and decrease some LBP with pelvis positioning.  WIll add more LB exercises next time.    PT Frequency  1x / week    PT Duration  6 weeks    PT Treatment/Interventions   ADLs/Self Care Home Management;Moist Heat;Manual lymph drainage;Scar mobilization;Taping;Therapeutic exercise;Patient/family education;Manual techniques;Passive range of motion    PT Next Visit Plan  script back for bras? PROM of the left shoulder, AAROM, eventually scar massage as needed, progress shoulder ROM and strength and work on low back if needed    PT Home Exercise Plan  Access Code: [email or web address] , work on pelvic positioning with hair       Patient will benefit from skilled therapeutic intervention in order to improve the following deficits and impairments:     Visit Diagnosis: H/O left mastectomy  Aftercare following surgery for neoplasm  Stiffness of left shoulder, not elsewhere classified  Abnormal posture     Problem List Patient Active Problem List   Diagnosis Date Noted  . Breast cancer, left ([place]) 03/01/2019  . Genetic testing 07/20/2018  . Family history of breast cancer 06/23/2018  . Family history of prostate cancer 06/23/2018  . Trochanteric bursitis, right hip 06/23/2017  . Unilateral primary osteoarthritis, right knee 06/23/2017  . Recurrent breast cancer, left ([place]) 04/22/2017  . 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 Malignant neoplasm of overlapping sites of left breast in female, estrogen receptor positive ([place]) 02/09/2016  . History of breast cancer, left, T1, N0, Her2Neu postive. 12/31/2010  . Hx of laparoscopic gastric banding 12/07/2010  . Morbidly obese ([place]) 11/04/2010  . Hypertension 11/04/2010  . Wears glasses 11/04/2010  . Gum disease 11/04/2010  . Breast lump 11/04/2010    Stark Bray 04/16/2019, 5:25 PM  [place], [place], [address] Phone: [phone]   Fax:  [phone]  Name: HASMIK DELATORRE MRN: [id number] Date of Birth: [birth year]

## 2019-04-16 NOTE — Patient Instructions (Signed)
Access Code: [email or web address]  URL: [email or web address]  Date: 04/16/2019  Prepared by: Shan Levans   Exercises  Supine Shoulder Flexion Extension AAROM with Dowel - 10 reps - 5 seconds hold - 1-2x daily - 7x weekly  Supine Lower Trunk Rotation - 10 reps - 1-3 sets - 5 second hold - 1x daily - 7x weekly  Standing Backward Shoulder Rolls - 10 reps - 1-2x daily - 7x weekly  Standing Shoulder Abduction ROM with Dowel - 10 reps - 1-3 sets - 3 second hold - 1x daily - 7x weekly  Standing Shoulder Flexion Wall Walk - 10 reps - 1-3 sets - 5 seconds hold - 1x daily - 7x weekly

## 2019-04-23 ENCOUNTER — Ambulatory Visit: Payer: BC Managed Care – PPO | Admitting: Rehabilitation

## 2019-04-24 ENCOUNTER — Ambulatory Visit: Payer: BC Managed Care – PPO | Admitting: Women's Health

## 2019-04-26 ENCOUNTER — Encounter

## 2019-04-26 ENCOUNTER — Other Ambulatory Visit: Payer: Self-pay

## 2019-04-26 ENCOUNTER — Encounter: Payer: Self-pay | Admitting: Physical Therapy

## 2019-04-26 ENCOUNTER — Ambulatory Visit: Payer: BC Managed Care – PPO | Admitting: Physical Therapy

## 2019-04-26 DIAGNOSIS — Z483 Aftercare following surgery for neoplasm: Secondary | ICD-10-CM

## 2019-04-26 DIAGNOSIS — R293 Abnormal posture: Secondary | ICD-10-CM

## 2019-04-26 DIAGNOSIS — Z9012 Acquired absence of left breast and nipple: Secondary | ICD-10-CM | POA: Diagnosis not present

## 2019-04-26 DIAGNOSIS — M25612 Stiffness of left shoulder, not elsewhere classified: Secondary | ICD-10-CM

## 2019-04-26 NOTE — Therapy (Signed)
[place], [place], [address] Phone: [phone]   Fax:  [phone]  Physical Therapy Treatment  Patient Details  Name: [name] MRN: [id number] Date of Birth: Nov 22, 1955 Referring Provider (PT): Dr. Harlow Mares   Encounter Date: 04/26/2019  PT End of Session - 04/26/19 0852    Visit Number  3    Number of Visits  12    Date for PT Re-Evaluation  05/21/19    PT Start Time  0807    PT Stop Time  0848    PT Time Calculation (min)  41 min    Activity Tolerance  Patient tolerated treatment well    Behavior During Therapy  [facility] for tasks assessed/performed       Past Medical History:  Diagnosis Date  . 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 Breast lump    left  . Cancer ([place])   . Family history of breast cancer 06/23/2018  . Family history of prostate cancer 06/23/2018  . Gum disease   . Hyperlipidemia    diet and exercise controlled, no med  . Hypertension   . Pre-diabetes    borderline - diet and exercise contolled, no meds  . Sleep apnea    uses CPAP nightly    Past Surgical History:  Procedure Laterality Date  . BREAST LUMPECTOMY  2012   left  . COLONOSCOPY    . FOOT MASS EXCISION    . LAPAROSCOPIC GASTRIC BANDING    . LATISSIMUS FLAP TO BREAST Left 03/01/2019   Procedure: LEFT BREAST LATIISSIMUS FLAP;  Surgeon: Crissie Reese, MD;  Location: [place];  Service: Plastics;  Laterality: Left;  [name] MASS EXCISION     back  . MASTECTOMY W/ SENTINEL NODE BIOPSY Left 03/01/2019   Procedure: LEFT MASTECTOMY WITH LEFT SENTINEL LYMPH NODE BIOPSY;  Surgeon: Alphonsa Overall, MD;  Location: [place];  Service: General;  Laterality: Left;  . PLACEMENT OF BREAST IMPLANTS Left 03/01/2019   Procedure: PLACEMENT OF SALINE BREAST IMPLANT;  Surgeon: Crissie Reese, MD;  Location: [place];  Service: Plastics;  Laterality: Left;  . PORTACATH PLACEMENT  02/11/11  . PORTACATH  REMOVAL  MARCH 2014  . RESECTOSCOPIC POLYPECTOMY  2001   MYOMECTOMY  . SOFT TISSUE CYST EXCISION     back cyst  . VAGINAL MASS EXCISION      There were no vitals filed for this visit.  Subjective Assessment - 04/26/19 0808    Subjective  I am doing much better but the other day I got that sharp stabbing pain in my back. I think the pelvic tilts work when I get them going.    Pertinent History  Pt with left mastectomy with SLNB due to on 03/01/19 by Dr. Lucia Gaskins with latissimus flap and placement of saline breast implant by Dr. Harlow Mares from [facility].  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  Total of 9 lymph nodes taken out. PMH excellent.    Patient Stated Goals  get back to doing hair randomly    Currently in Pain?  No/denies         [facility] PT Assessment - 04/26/19 0001      AROM   Left Shoulder Flexion  144 Degrees    Left Shoulder ABduction  114 Degrees   pure abduction  [facility] Adult PT Treatment/Exercise - 04/26/19 0001      Manual Therapy   Manual Therapy  Soft tissue mobilization;Edema management    Edema Management  called Dr. Harlow Mares office since script not signed, they were only receiving cover sheet so it was re faxed today, pt to be measured today and will have to bring script at a later date    Soft tissue mobilization  in right sidelying to left lateral trunk along serratus and lats in area of tightness and tenderness, numerous knots palpable at beginning of session but improvement noted by end of session and tightness decreased             PT Education - 04/26/19 0853    Education Details  lymphedema risk reduction practices    Person(s) Educated  Patient    Methods  Explanation    Comprehension  Verbalized understanding          PT Long Term Goals - 04/09/19 2201      PT LONG TERM GOAL #1   Title  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  without limitations    Time  6    Period  Weeks    Status  New      PT LONG TERM GOAL #2   Title  Pt will be able to pick up things from the floor without having to use the grabber    Time  6    Period  Weeks    Status  New      PT LONG TERM GOAL #3   Title  Pt will improve left shoulder AROM to flexion: 155, and abduction : 155 to demonstrate improved mobility    Baseline  flex: 125, abd: 105    Time  6    Period  Weeks    Status  New      PT LONG TERM GOAL #4   Title  Pt will obtain appropriate post surgical garments as needed    Time  6    Period  Weeks    Status  New            Plan - 04/26/19 0847    Clinical Impression Statement  Pt states the pelvic tilts have been helping her with the back pain. She did them the other day while doing hair and reported relief. She has been having pain, tenderness and tightness along left lateral trunk in area of serratus and lats. She sometimes has trouble sleeping due to pain in this area. Focused on soft tissue mobilization to this area today with numerous areas of tightness and knots palpable. There was a marked improvement by end of session and pt did not feel as limited with ROM. Educated pt on lymphedema risk reduction practices while performing STM. Pt would benefit from continued soft tissue mobilization to this area as well as supine scap exercises and core strengthening to help decrease back pain.    Stability/Clinical Decision Making  Stable/Uncomplicated    Rehab Potential  Excellent    PT Frequency  1x / week    PT Duration  6 weeks    PT Treatment/Interventions  ADLs/Self Care Home Management;Moist Heat;Manual lymph drainage;Scar mobilization;Taping;Therapeutic exercise;Patient/family education;Manual techniques;Passive range of motion    PT Next Visit Plan  teach supine scap, teach supine pelvic tilts, cont STM to left lateral trunk and across pec, script back for bras? PROM of the left shoulder, AAROM, eventually scar  massage as needed, progress shoulder  ROM and strength and work on low back if needed    PT Home Exercise Plan  Access Code: [place] , work on pelvic positioning with hair    Consulted and Agree with Plan of Care  Patient       Patient will benefit from skilled therapeutic intervention in order to improve the following deficits and impairments:  Decreased skin integrity, Increased muscle spasms, Decreased range of motion, Decreased scar mobility, Pain, Postural dysfunction, Impaired UE functional use  Visit Diagnosis: Aftercare following surgery for neoplasm  Stiffness of left shoulder, not elsewhere classified  Abnormal posture     Problem List Patient Active Problem List   Diagnosis Date Noted  . Breast cancer, left ([place]) 03/01/2019  . Genetic testing 07/20/2018  . Family history of breast cancer 06/23/2018  . Family history of prostate cancer 06/23/2018  . Trochanteric bursitis, right hip 06/23/2017  . Unilateral primary osteoarthritis, right knee 06/23/2017  . Recurrent breast cancer, left ([place]) 04/22/2017  . 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 Malignant neoplasm of overlapping sites of left breast in female, estrogen receptor positive ([place]) 02/09/2016  . History of breast cancer, left, T1, N0, Her2Neu postive. 12/31/2010  . Hx of laparoscopic gastric banding 12/07/2010  . Morbidly obese ([place]) 11/04/2010  . Hypertension 11/04/2010  . Wears glasses 11/04/2010  . Gum disease 11/04/2010  . Breast lump 11/04/2010    Allyson Sabal [facility] 04/26/2019, 9:00 AM  [place], [place], [address] Phone: [phone]   Fax:  [phone]  Name: ISHITA LAULETTA MRN: [id number] Date of Birth: [birth year]  Manus Gunning, PT 04/26/19 9:00 AM

## 2019-04-27 ENCOUNTER — Other Ambulatory Visit: Payer: Self-pay

## 2019-04-27 ENCOUNTER — Ambulatory Visit: Payer: BC Managed Care – PPO | Admitting: Rehabilitation

## 2019-04-27 DIAGNOSIS — C50812 Malignant neoplasm of overlapping sites of left female breast: Secondary | ICD-10-CM

## 2019-04-29 NOTE — Progress Notes (Signed)
[place]  [phone] [phone]   ID: [name]   DOB: [birth year]  MR#: [id number]  ERX#:[id number]  Patient Care Team: Marton Redwood, MD as PCP - General (Internal Medicine) Koa Palla, Virgie Dad, MD as Attending Physician (Hematology and Oncology) Arloa Koh, MD (Inactive) as Attending Physician (Radiation Oncology) Alphonsa Overall, MD as Consulting Physician (General Surgery)  OTHER MD: [name], MD (GYN)   CHIEF COMPLAINT:  Left Breast Cancer  CURRENT TREATMENT: exemestane   INTERVAL HISTORY: Echo returns today for follow-up and treatment of of [name] recurrent left breast cancer.   She was started on exemestane at [name] last visit.  She is tolerating this well, with no unusual side effects.  She is obtaining it at a very good price.  Since [name] last visit, she underwent genetics testing on 06/22/2018, which yielded negative results.  She also underwent left mastectomy with sentinel lymph node biopsy on 03/01/2019 under Dr. Lucia Gaskins. 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  [name] case was discussed at the 03/15/2019 multidisciplinary breast cancer conference.  At that time we discussed we could consider chemotherapy, antiestrogens but that no further radiation.  In fact Dr. Lucia Gaskins discussed a port with [name] but the patient refused.  All 6 biopsied lymph nodes were negative for carcinoma.  [name]'s last bone density screening on 04/22/2016 at [facility], showed a T-score of -1.0, which is considered normal. She is overdue for a bone density screening.   REVIEW OF SYSTEMS: [name] had many questions regarding [name] pathology today and we went over all those in detail.  Some of that  discussion is recorded below.  Currently she has retired, and is very pleased with that decision.  She is not yet exercising regularly.  She is not entirely satisfied with [name] reconstruction.  She denies pain fever rash bleeding unusual headaches visual changes cough phlegm production pleurisy shortness of breath or change in bowel or bladder habits.  A detailed review of systems today was stable.   HISTORY OF PRESENT ILLNESS: [name] was referred by [name] for treatment of left breast cancer.   The patient underwent screening bilateral mammography July 04, 2009, at [facility] and this showed only a scattered fibroglandular densities. However, repeat screening exam Sep 29, 2010 showed a potential abnormality in the left breast, retroareolar region. The patient was recalled for additional views Oct 08, 2010 and Dr. [name] was able to locate the nodular density noted on the screening exam. It measured approximately 8 mm. Ultrasound showed a second nodular density just lateral to the nipple areolar complex. In that area, there was what appears to be either a small cluster of cysts or perhaps a solid nodule. In particular, the nodule seen in the 3 o'clock position was felt to be most likely a fibroadenoma. The other area, however, required biopsy and this was performed October 26, 2010. The pathology from this procedure ([id number]) showed a high-grade invasive ductal carcinoma which was estrogen-receptor positive at 98%, progesterone-receptor positive at 13% with an elevated MIB-1 at 69% and no evidence of [name]-2 amplification, the ratio by C[facility] being 1.32.   With this information the patient was referred for bilateral breast MRIs and these were performed October 30, 2010. In the left breast there was a 1.8 cm ill-defined  area in the central breast associated with post biopsy changes. There were really no other suspicious areas in either breast and no bony abnormalities and no abnormal appearing or enlarged  lymph nodes.   Patient underwent left lumpectomy and sentinel lymph node sampling December 10, 2010 ([id number]) for a 1.1-cm invasive ductal carcinoma, grade 3, with 0/2 sentinel lymph nodes involved and so T1c N0 (stage I); the tumor being estrogen receptor 98% and progesterone receptor 13% positive, with no [name]-2 amplification (by initial determination, but amplified on repeat with a ratio of 2.30); with an Oncotype DX score of 43 predicting a 29% risk of recurrence if she takes 5 years of tamoxifen. ([name]-2 was negative on the Oncotype DX determination, which uses a completely different method).   [name] subsequent history is as detailed below.    PAST MEDICAL HISTORY: Past Medical History:  Diagnosis Date  . Breast cancer ([place])    stage I high grade invasive ductal ca left breast  . Breast lump    left  . Cancer ([place])   . Family history of breast cancer 06/23/2018  . Family history of prostate cancer 06/23/2018  . Gum disease   . Hyperlipidemia    diet and exercise controlled, no med  . Hypertension   . Pre-diabetes    borderline - diet and exercise contolled, no meds  . Sleep apnea    uses CPAP nightly    PAST SURGICAL HISTORY: Past Surgical History:  Procedure Laterality Date  . BREAST LUMPECTOMY  2012   left  . COLONOSCOPY    . FOOT MASS EXCISION    . LAPAROSCOPIC GASTRIC BANDING    . LATISSIMUS FLAP TO BREAST Left 03/01/2019   Procedure: LEFT BREAST LATIISSIMUS FLAP;  Surgeon: Crissie Reese, MD;  Location: [facility];  Service: Plastics;  Laterality: Left;  [name] MASS EXCISION     back  . MASTECTOMY W/ SENTINEL NODE BIOPSY Left 03/01/2019   Procedure: LEFT MASTECTOMY WITH LEFT SENTINEL LYMPH NODE BIOPSY;  Surgeon: Alphonsa Overall, MD;  Location: [facility];  Service: General;  Laterality: Left;  . PLACEMENT OF BREAST IMPLANTS Left 03/01/2019   Procedure: PLACEMENT OF SALINE BREAST IMPLANT;  Surgeon: Crissie Reese, MD;  Location: [facility];  Service: Plastics;  Laterality: Left;  . PORTACATH  PLACEMENT  02/11/11  . PORTACATH REMOVAL  MARCH 2014  . RESECTOSCOPIC POLYPECTOMY  2001   MYOMECTOMY  . SOFT TISSUE CYST EXCISION     back cyst  . VAGINAL MASS EXCISION      FAMILY HISTORY Family History  Problem Relation Age of Onset  . Cancer Maternal Uncle 72       prostate, metastatic  . Alcohol abuse Father   . Cirrhosis Father   . Breast cancer Mother 24    GYNECOLOGIC HISTORY: ((Updated 01/26/2013) She had menarche at age 22. She is GX P0.  Last menstrual period was late May 2012.  She has never used hormone replacement.     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  She is divorced and lives by herself. She attends a [employer]. She used to go to the gym at least three times a week.    ADVANCED DIRECTIVES: Not in place   HEALTH MAINTENANCE: Social History   Tobacco Use  . Smoking status: Former Smoker    Packs/day: 1.00    Years: 21.00    Pack years: 21.00    Types: Cigarettes    Quit date: 03/25/1999  Years since quitting: 20.1  . Smokeless tobacco: Never Used  Substance Use Topics  . Alcohol use: Yes    Alcohol/week: 7.0 standard drinks    Types: 7 Glasses of wine per week    Comment: red wine  . Drug use: No     Colonoscopy:   PAP:   Bone density: March 2011, Normal  Lipid panel:    No Known Allergies  Current Outpatient Medications  Medication Sig Dispense Refill  . amLODipine-olmesartan (AZOR) 10-40 MG per tablet Take 1 tablet by mouth daily. (Patient taking differently: Take 1 tablet by mouth at bedtime. ) 30 tablet 6   No current facility-administered medications for this visit.   OBJECTIVE: Morbidly obese African-American [name] who appears stated age 81:   04/30/19 1104  BP: (!) 141/75  Pulse: 77  Resp: 18  Temp: 98.3 F (36.8 C)  SpO2: 100%     Body mass index is 44.72 kg/m.    ECOG FS: 1 Filed Weights   04/30/19 1104  Weight: 229 lb (103.9 kg)    Sclerae  unicteric, EOMs intact Wearing a mask No cervical or supraclavicular adenopathy Lungs no rales or rhonchi Heart regular rate and rhythm Abd soft, obese, nontender, positive bowel sounds MSK no focal spinal tenderness, no upper extremity lymphedema Neuro: nonfocal, well oriented, appropriate affect Breasts: The right breast is status post reduction mammoplasty.  The left breast is status post mastectomy with latissimus flap and saline reconstruction.  There is some irregularity medially and of course the irradiated skin is different from the back skin.  Both axillae are benign.  LAB RESULTS: Lab Results  Component Value Date   WBC 4.3 04/30/2019   NEUTROABS 2.5 04/30/2019   HGB 12.1 04/30/2019   HCT 38.9 04/30/2019   MCV 84.7 04/30/2019   PLT 305 04/30/2019      Chemistry      Component Value Date/Time   NA 140 04/30/2019 1033   NA 140 04/22/2017 1327   K 3.9 04/30/2019 1033   K 4.0 04/22/2017 1327   CL 105 04/30/2019 1033   CL 105 09/08/2012 1320   CO2 26 04/30/2019 1033   CO2 25 04/22/2017 1327   BUN 12 04/30/2019 1033   BUN 15.0 04/22/2017 1327   CREATININE 0.81 04/30/2019 1033   CREATININE 0.8 04/22/2017 1327      Component Value Date/Time   CALCIUM 8.7 (L) 04/30/2019 1033   CALCIUM 9.0 04/22/2017 1327   ALKPHOS 65 04/30/2019 1033   ALKPHOS 58 04/22/2017 1327   AST 15 04/30/2019 1033   AST 13 04/22/2017 1327   ALT 14 04/30/2019 1033   ALT 11 04/22/2017 1327   BILITOT 0.4 04/30/2019 1033   BILITOT 0.38 04/22/2017 1327       STUDIES: No results found.    ASSESSMENT: 63 y.o. [name]   1. Status post left lumpectomy and sentinel lymph node sampling August 2012 for a T1cN0, stage IA invasive ductal carcinoma of overlapping sites, grade 3, estrogen receptor 98% and progesterone receptor 13% positive, [name]-2/neu amplified with a ratio of 2.3.   2. Oncotype showed a recurrence score of 43 ( "high risk"), predicting a 29% risk of recurrence if the  patient's only adjuvant treatment was tamoxifen   3. received adjuvant docetaxel/ cyclophosphamide x4, completed December 2012   4. trastuzumab started 04/15/2011, completed 05/05/2012; final echo 02/18/2012  5. radiation therapy, completed July 23, 2011  6. began on tamoxifen March 2013, but never taken consistently   RECURRENT  DISEASE: November 2018 7.  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  Anastrozole started 04/22/2017, discontinued January 2020, poorly tolerated  (a) mammography at [facility] 03/20/2018 shows evidence of growth  (b) exemestane started January 2020, continued with significant interruptions  9.  Status post left mastectomy with sentinel lymph node sampling 03/01/2019 for a pT2 pN0. Stage IB  invasive ductal carcinoma, grade 2, with negative margins.  The invasive tumor was estrogen and progesterone receptor positive, with an MIB-1 of 30%.  [name]-2 was not amplified.  (a) immediate left latissimus reconstruction with saline implant placement  10.  Genetics testing 07/17/2018 through the Common Hereditary Cancers Panel offered by [facility] found no deleterious mutations in APC, ATM, AXIN2, BARD1, BMPR1A, BRCA1, BRCA2, BRIP1, CDH1, CDKN2A (p14ARF), CDKN2A (p16INK4a), CKD4, CHEK2, CTNNA1, DICER1, EPCAM (Deletion/duplication testing only), GREM1 (promoter region deletion/duplication testing only), KIT, MEN1, MLH1, MSH2, MSH3, MSH6, MUTYH, NBN, NF1, NHTL1, PALB2, PDGFRA, PMS2, POLD1, POLE, PTEN, RAD50, RAD51C, RAD51D, SDHB, SDHC, SDHD, SMAD4, SMARCA4. STK11, TP53, TSC1, TSC2, and VHL.  The following genes were evaluated for sequence changes only: SDHA and HOXB13 c.251G>A variant only   PLAN: I spent approximately 50 minutes face to face with [name] with more than 50% of that time spent in  counseling and coordination of care.  We tried to clarify the unusual course of [name] treatment.  Basically [name] surgery was delayed because the initial biopsy in 2018 showed only noninvasive tumor and because of the intervening pandemic.  [name] had many things to get done before she felt she could dedicate time to the surgery so she retired and then proceeded to [name] mastectomy.  It turned out that there was a T2N0 invasive tumor.  Even though she had a mastectomy she understands this tumor may have already spread to other parts of [name] body in a microscopic way, which could not be found by any scan or lab work.  That is the reason she needs systemic treatment.  She understands unlike the earlier cancer she had this 1 is [name]-2 negative so anti-[name]-2 therapy would not be useful.  The 2 forms of systemic therapy open to [name] are antiestrogens and chemotherapy.  We discussed chemotherapy extensively today.  We certainly have room since she has had no anthracyclines and she could consider CMF chemotherapy.  In fact I described chemotherapy in great detail and suggested she proceed with this generally well-tolerated treatment, given every 21 days for 8 cycles.  I told [name] the benefit from this chemotherapy in terms of risk reduction would be somewhere between 5 and 10%.  She was able to understand the need for systemic treatment as well as the possible benefits and side effects but is very clear in [name] mind that she does not want chemotherapy.  She is doing well with exemestane and she is willing to take that for 5 years.  If we "come up with something else", she would consider that as well.  There are some out-of-the-box options.  We should test [name] tumor for PD-L1.  We can also consider adding ribociclib.  In addition to this we need to obtain a bone density and consider zoledronate twice a year, which has been documented to further decrease breast cancer risk.  She is also behind on mammography and we will obtain a  mammogram before [name] return visit.  The  plan then for now is to continue exemestane.  She will see me again in April to consider the additional options just listed.  She knows to call for any other issues that may develop before the return visit.   [name], Virgie Dad, MD  04/30/19 11:35 AM Medical Oncology and Hematology [facility] [address] [place], [place] [address] Tel. [phone]    Fax. [phone]   I, Wilburn Mylar, am acting as scribe for Dr. Virgie Dad. [name].  I, Lurline Del MD, have reviewed the above documentation for accuracy and completeness, and I agree with the above.

## 2019-04-30 ENCOUNTER — Other Ambulatory Visit: Payer: Self-pay

## 2019-04-30 ENCOUNTER — Inpatient Hospital Stay: Payer: BC Managed Care – PPO | Attending: Oncology

## 2019-04-30 ENCOUNTER — Inpatient Hospital Stay (HOSPITAL_BASED_OUTPATIENT_CLINIC_OR_DEPARTMENT_OTHER): Payer: BC Managed Care – PPO | Admitting: Oncology

## 2019-04-30 VITALS — BP 141/75 | HR 77 | Temp 98.3°F | Resp 18 | Ht 60.0 in | Wt 229.0 lb

## 2019-04-30 DIAGNOSIS — Z87891 Personal history of nicotine dependence: Secondary | ICD-10-CM | POA: Insufficient documentation

## 2019-04-30 DIAGNOSIS — C50812 Malignant neoplasm of overlapping sites of left female breast: Secondary | ICD-10-CM

## 2019-04-30 DIAGNOSIS — D0512 Intraductal carcinoma in situ of left breast: Secondary | ICD-10-CM | POA: Diagnosis not present

## 2019-04-30 DIAGNOSIS — Z79899 Other long term (current) drug therapy: Secondary | ICD-10-CM | POA: Diagnosis not present

## 2019-04-30 DIAGNOSIS — Z8249 Family history of ischemic heart disease and other diseases of the circulatory system: Secondary | ICD-10-CM | POA: Diagnosis not present

## 2019-04-30 DIAGNOSIS — Z170 Estrogen receptor positive status [ER+]: Secondary | ICD-10-CM

## 2019-04-30 DIAGNOSIS — Z923 Personal history of irradiation: Secondary | ICD-10-CM | POA: Insufficient documentation

## 2019-04-30 DIAGNOSIS — Z79811 Long term (current) use of aromatase inhibitors: Secondary | ICD-10-CM | POA: Diagnosis not present

## 2019-04-30 DIAGNOSIS — C50912 Malignant neoplasm of unspecified site of left female breast: Secondary | ICD-10-CM | POA: Insufficient documentation

## 2019-04-30 DIAGNOSIS — Z8042 Family history of malignant neoplasm of prostate: Secondary | ICD-10-CM | POA: Diagnosis not present

## 2019-04-30 DIAGNOSIS — Z803 Family history of malignant neoplasm of breast: Secondary | ICD-10-CM | POA: Diagnosis not present

## 2019-04-30 LAB — CBC WITH DIFFERENTIAL (CANCER CENTER ONLY)
Abs Immature Granulocytes: 0.01 10*3/uL (ref 0.00–0.07)
Basophils Absolute: 0 10*3/uL (ref 0.0–0.1)
Basophils Relative: 1 %
Eosinophils Absolute: 0 10*3/uL (ref 0.0–0.5)
Eosinophils Relative: 1 %
HCT: 38.9 % (ref 36.0–46.0)
Hemoglobin: 12.1 g/dL (ref 12.0–15.0)
Immature Granulocytes: 0 %
Lymphocytes Relative: 33 %
Lymphs Abs: 1.4 10*3/uL (ref 0.7–4.0)
MCH: 26.4 pg (ref 26.0–34.0)
MCHC: 31.1 g/dL (ref 30.0–36.0)
MCV: 84.7 fL (ref 80.0–100.0)
Monocytes Absolute: 0.3 10*3/uL (ref 0.1–1.0)
Monocytes Relative: 8 %
Neutro Abs: 2.5 10*3/uL (ref 1.7–7.7)
Neutrophils Relative %: 57 %
Platelet Count: 305 10*3/uL (ref 150–400)
RBC: 4.59 MIL/uL (ref 3.87–5.11)
RDW: 14.2 % (ref 11.5–15.5)
WBC Count: 4.3 10*3/uL (ref 4.0–10.5)
nRBC: 0 % (ref 0.0–0.2)

## 2019-04-30 LAB — CMP (CANCER CENTER ONLY)
ALT: 14 U/L (ref 0–44)
AST: 15 U/L (ref 15–41)
Albumin: 3.9 g/dL (ref 3.5–5.0)
Alkaline Phosphatase: 65 U/L (ref 38–126)
Anion gap: 9 (ref 5–15)
BUN: 12 mg/dL (ref 8–23)
CO2: 26 mmol/L (ref 22–32)
Calcium: 8.7 mg/dL — ABNORMAL LOW (ref 8.9–10.3)
Chloride: 105 mmol/L (ref 98–111)
Creatinine: 0.81 mg/dL (ref 0.44–1.00)
GFR, Est AFR Am: >60 mL/min (ref >60)
GFR, Estimated: >60 mL/min (ref >60)
Glucose, Bld: 113 mg/dL — ABNORMAL HIGH (ref 70–99)
Potassium: 3.9 mmol/L (ref 3.5–5.1)
Sodium: 140 mmol/L (ref 135–145)
Total Bilirubin: 0.4 mg/dL (ref 0.3–1.2)
Total Protein: 7.5 g/dL (ref 6.5–8.1)

## 2019-04-30 MED ORDER — EXEMESTANE 25 MG PO TABS: 25.0000 mg | ORAL_TABLET | Freq: Every day | ORAL | 4 refills | Status: DC

## 2019-05-01 ENCOUNTER — Telehealth: Payer: Self-pay | Admitting: Oncology

## 2019-05-01 NOTE — Telephone Encounter (Signed)
I left a message regarding schedule  

## 2019-05-02 ENCOUNTER — Ambulatory Visit: Payer: BC Managed Care – PPO | Admitting: Rehabilitation

## 2019-05-02 ENCOUNTER — Encounter: Payer: Self-pay | Admitting: Rehabilitation

## 2019-05-02 ENCOUNTER — Other Ambulatory Visit: Payer: Self-pay

## 2019-05-02 DIAGNOSIS — M25612 Stiffness of left shoulder, not elsewhere classified: Secondary | ICD-10-CM

## 2019-05-02 DIAGNOSIS — Z483 Aftercare following surgery for neoplasm: Secondary | ICD-10-CM

## 2019-05-02 DIAGNOSIS — R293 Abnormal posture: Secondary | ICD-10-CM

## 2019-05-02 DIAGNOSIS — Z9012 Acquired absence of left breast and nipple: Secondary | ICD-10-CM | POA: Diagnosis not present

## 2019-05-02 NOTE — Therapy (Signed)
[place], [place], [address] Phone: [phone]   Fax:  [phone]  Physical Therapy Treatment  Patient Details  Name: [name] MRN: [id number] Date of Birth: Sep 15, 1955 Referring Provider (PT): Dr. Harlow Mares   Encounter Date: 05/02/2019  PT End of Session - 05/02/19 1506    Visit Number  4    Number of Visits  12    Date for PT Re-Evaluation  05/21/19    PT Start Time  1502    PT Stop Time  1550    PT Time Calculation (min)  48 min    Activity Tolerance  Patient tolerated treatment well    Behavior During Therapy  [facility] for tasks assessed/performed       Past Medical History:  Diagnosis Date  . Breast cancer ([place])    stage I high grade invasive ductal ca left breast  . Breast lump    left  . Cancer ([place])   . Family history of breast cancer 06/23/2018  . Family history of prostate cancer 06/23/2018  . Gum disease   . Hyperlipidemia    diet and exercise controlled, no med  . Hypertension   . Pre-diabetes    borderline - diet and exercise contolled, no meds  . Sleep apnea    uses CPAP nightly    Past Surgical History:  Procedure Laterality Date  . BREAST LUMPECTOMY  2012   left  . COLONOSCOPY    . FOOT MASS EXCISION    . LAPAROSCOPIC GASTRIC BANDING    . LATISSIMUS FLAP TO BREAST Left 03/01/2019   Procedure: LEFT BREAST LATIISSIMUS FLAP;  Surgeon: Crissie Reese, MD;  Location: [facility];  Service: Plastics;  Laterality: Left;  [name] MASS EXCISION     back  . MASTECTOMY W/ SENTINEL NODE BIOPSY Left 03/01/2019   Procedure: LEFT MASTECTOMY WITH LEFT SENTINEL LYMPH NODE BIOPSY;  Surgeon: Alphonsa Overall, MD;  Location: [facility];  Service: General;  Laterality: Left;  . PLACEMENT OF BREAST IMPLANTS Left 03/01/2019   Procedure: PLACEMENT OF SALINE BREAST IMPLANT;  Surgeon: Crissie Reese, MD;  Location: [facility];  Service: Plastics;  Laterality: Left;  . PORTACATH PLACEMENT  02/11/11  . PORTACATH  REMOVAL  MARCH 2014  . RESECTOSCOPIC POLYPECTOMY  2001   MYOMECTOMY  . SOFT TISSUE CYST EXCISION     back cyst  . VAGINAL MASS EXCISION      There were no vitals filed for this visit.  Subjective Assessment - 05/02/19 1503    Subjective  I saw Dr. Jana Hakim I am going to do a pill.  I had to take a muscle relaxer last night due to increased muscle pain in the left lat area    Pertinent History  Pt with left mastectomy with SLNB due to on 03/01/19 by Dr. Lucia Gaskins with latissimus flap and placement of saline breast implant by Dr. Harlow Mares from [facility].  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  Total of 9 lymph nodes taken out. PMH excellent.    Currently in Pain?  No/denies   just pressure        [facility] PT Assessment - 05/02/19 0001      AROM   Left Shoulder Flexion  150 Degrees   stretch   Left Shoulder ABduction  121 Degrees   stretch   Left Shoulder Horizontal ABduction  15 Degrees  [facility] PT Treatment/Exercise - 05/02/19 0001      Shoulder Exercises: Seated   Other Seated Exercises  thoracic trunk rotation bilateral in seated with arm across the chest x 5 each       Shoulder Exercises: Sidelying   ABduction  5 reps;Left      Shoulder Exercises: Standing   ABduction  10 reps;AAROM    ABduction Limitations  with dowel      Shoulder Exercises: Pulleys   Flexion  2 minutes    Flexion Limitations  with vcs for completion and performance      Shoulder Exercises: Therapy Ball   Flexion  Both;10 reps    Flexion Limitations  cueing for first performance and maintaining posterior pelvic tilt      Manual Therapy   Manual therapy comments  Pt arrives wearing new bra which she reports makes her feel much better but sometimes it gets stuck in the lateral crease    Soft tissue mobilization  in right sidelying to left lateral trunk along serratus and lats in area of  tightness and tenderness, numerous knots palpable at beginning of session but improvement noted by end of session and tightness decreased                  PT Long Term Goals - 05/02/19 1657      PT LONG TERM GOAL #1   Title  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 without limitations    Status  Achieved      PT LONG TERM GOAL #2   Title  Pt will be able to pick up things from the floor without having to use the grabber    Status  Achieved      PT LONG TERM GOAL #3   Title  Pt will improve left shoulder AROM to flexion: 155, and abduction : 155 to demonstrate improved mobility    Baseline  flex: 125, abd: 105,  on 05/02/19: 155 and 121    Status  Partially Met      PT LONG TERM GOAL #4   Title  Pt will obtain appropriate post surgical garments as needed    Status  Achieved            Plan - 05/02/19 1655    Clinical Impression Statement  Pt continues to have improving AROM with pull in the axillary region with all end range.  Biggest complaints are tightness near the incision and her low back pain.  Discussed pt continuing with her ROM exercises at home and PT will focus on STM and start some low back and postural strengthening.  Pt agreeable with this plan.    PT Next Visit Plan  teach supine scap, teach supine pelvic tilts, cont STM to left lateral trunk and across pec,  progress shoulder ROM and strength incorporating core    PT Home Exercise Plan  Access Code: [place] , work on pelvic positioning with hair    Consulted and Agree with Plan of Care  Patient       Patient will benefit from skilled therapeutic intervention in order to improve the following deficits and impairments:     Visit Diagnosis: Aftercare following surgery for neoplasm  Stiffness of left shoulder, not elsewhere classified  Abnormal posture  H/O left mastectomy     Problem List Patient Active Problem List   Diagnosis Date Noted  . Genetic testing 07/20/2018  . Family history  of breast cancer 06/23/2018  .  Family history of prostate cancer 06/23/2018  . Trochanteric bursitis, right hip 06/23/2017  . Unilateral primary osteoarthritis, right knee 06/23/2017  . Recurrent breast cancer, left ([place]) 04/22/2017  . 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 Malignant neoplasm of overlapping sites of left breast in female, estrogen receptor positive ([place]) 02/09/2016  . History of breast cancer, left, T1, N0, Her2Neu postive. 12/31/2010  . Hx of laparoscopic gastric banding 12/07/2010  . Morbidly obese ([place]) 11/04/2010  . Hypertension 11/04/2010  . Wears glasses 11/04/2010  . Gum disease 11/04/2010  . Breast lump 11/04/2010    Stark Bray 05/02/2019, 4:59 PM  [place], [place], [address] Phone: [phone]   Fax:  [phone]  Name: TRACHELLE LOW MRN: [id number] Date of Birth: [birth year]

## 2019-05-03 NOTE — Progress Notes (Signed)
RN spoke with [name] at [facility] pathology.  PD-L1 requested on accession number [id number].

## 2019-05-07 ENCOUNTER — Other Ambulatory Visit (HOSPITAL_COMMUNITY)
Admission: RE | Admit: 2019-05-07 | Discharge: 2019-05-07 | Disposition: A | Discharge status: Home / Self Care | Payer: BC Managed Care – PPO | Source: Ambulatory Visit | Attending: Oncology | Admitting: Oncology

## 2019-05-07 ENCOUNTER — Other Ambulatory Visit: Payer: Self-pay

## 2019-05-07 ENCOUNTER — Ambulatory Visit: Payer: BC Managed Care – PPO | Admitting: Rehabilitation

## 2019-05-07 ENCOUNTER — Encounter: Payer: Self-pay | Admitting: Rehabilitation

## 2019-05-07 DIAGNOSIS — M25612 Stiffness of left shoulder, not elsewhere classified: Secondary | ICD-10-CM

## 2019-05-07 DIAGNOSIS — C50912 Malignant neoplasm of unspecified site of left female breast: Secondary | ICD-10-CM | POA: Diagnosis present

## 2019-05-07 DIAGNOSIS — Z9012 Acquired absence of left breast and nipple: Secondary | ICD-10-CM | POA: Diagnosis not present

## 2019-05-07 DIAGNOSIS — Z483 Aftercare following surgery for neoplasm: Secondary | ICD-10-CM

## 2019-05-07 DIAGNOSIS — R293 Abnormal posture: Secondary | ICD-10-CM

## 2019-05-07 NOTE — Therapy (Signed)
[facility] [address] [place], [place], [address] Phone: [phone]   Fax:  [phone]  Physical Therapy Treatment  Patient Details  Name: [name] MRN: [id number] Date of Birth: 10/08/1955 Referring Provider (PT): Dr. Bowers   Encounter Date: 05/07/2019  PT End of Session - 05/07/19 1456    Visit Number  5    Number of Visits  12    Date for PT Re-Evaluation  05/21/19    PT Start Time  1400    PT Stop Time  1450    PT Time Calculation (min)  50 min    Activity Tolerance  Patient tolerated treatment well    Behavior During Therapy  WFL for tasks assessed/performed       Past Medical History:  Diagnosis Date  . 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 Breast lump    left  . Cancer (HCC)   . Family history of breast cancer 06/23/2018  . Family history of prostate cancer 06/23/2018  . Gum disease   . Hyperlipidemia    diet and exercise controlled, no med  . Hypertension   . Pre-diabetes    borderline - diet and exercise contolled, no meds  . Sleep apnea    uses CPAP nightly    Past Surgical History:  Procedure Laterality Date  . BREAST LUMPECTOMY  2012   left  . COLONOSCOPY    . FOOT MASS EXCISION    . LAPAROSCOPIC GASTRIC BANDING    . LATISSIMUS FLAP TO BREAST Left 03/01/2019   Procedure: LEFT BREAST LATIISSIMUS FLAP;  Surgeon: Bowers, David, MD;  Location: [facility];  Service: Plastics;  Laterality: Left;  . MASS EXCISION     back  . MASTECTOMY W/ SENTINEL NODE BIOPSY Left 03/01/2019   Procedure: LEFT MASTECTOMY WITH LEFT SENTINEL LYMPH NODE BIOPSY;  Surgeon: Newman, David, MD;  Location: [facility];  Service: General;  Laterality: Left;  . PLACEMENT OF BREAST IMPLANTS Left 03/01/2019   Procedure: PLACEMENT OF SALINE BREAST IMPLANT;  Surgeon: Bowers, David, MD;  Location: [facility];  Service: Plastics;  Laterality: Left;  . PORTACATH PLACEMENT  02/11/11  . PORTACATH  REMOVAL  MARCH 2014  . RESECTOSCOPIC POLYPECTOMY  2001   MYOMECTOMY  . SOFT TISSUE CYST EXCISION     back cyst  . VAGINAL MASS EXCISION      There were no vitals filed for this visit.  Subjective Assessment - 05/07/19 1401    Subjective  I just woke up tight but once I started moving it feels better. I still have that catch in my back and the numb place by my incision.  I don't think I can do any more visits if I have to pay.    Pertinent History  Pt with left mastectomy with SLNB due to on 03/01/19 by Dr. Newman with latissimus flap and placement of saline breast implant by Dr. Bowers from [facility].  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  Total of 9 lymph nodes taken out. PMH excellent.    Patient Stated Goals  get back to doing hair randomly    Currently in Pain?  No/denies         [facility] PT Assessment - 05/07/19 0001      ROM / Strength   AROM / PROM / Strength  Strength        Strength   Strength Assessment Site  Shoulder    Right/Left Shoulder  Right;Left                   [facility] Adult PT Treatment/Exercise - 05/07/19 0001      Lumbar Exercises: Stretches   Piriformis Stretch  1 rep;30 seconds    Piriformis Stretch Limitations  both supine ankle on opposite knee      Lumbar Exercises: Supine   Pelvic Tilt  10 reps    Pelvic Tilt Limitations  APT and PPT with cueing for performance; good initial performance with ability to move through the ROM and with no pain    Bridge Limitations  attempted but too much pulling in the left back; added to HEP to keep trying      Shoulder Exercises: Supine   Horizontal ABduction  Both;10 reps;Theraband    Theraband Level (Shoulder Horizontal ABduction)  Level 1 (Yellow)    External Rotation  Both;10 reps    Theraband Level (Shoulder External Rotation)  Level 1 (Yellow)    Diagonals  Left;10 reps;Theraband    Theraband Level (Shoulder  Diagonals)  Level 1 (Yellow)    Other Supine Exercises  cueing for all supine scap and added to HEP      Shoulder Exercises: Pulleys   Flexion  2 minutes    ABduction  2 minutes    ABduction Limitations  with vcs for performance and to stop leaning to the side with the movement      Shoulder Exercises: Therapy Ball   Flexion  Both;10 reps    ABduction  10 reps;Left    ABduction Limitations  wall slide only      Shoulder Exercises: Stretch   Other Shoulder Stretches  behind the head WNL    Other Shoulder Stretches  behind the back work with posterior shoulder roll added x 5" x 5             PT Education - 05/07/19 1456    Education Details  updated HEP    Person(s) Educated  Patient    Methods  Explanation;Verbal cues;Tactile cues;Demonstration;Handout    Comprehension  Verbalized understanding;Returned demonstration;Verbal cues required;Tactile cues required          PT Long Term Goals - 05/07/19 1501      PT LONG TERM GOAL #1   Title  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 without limitations    Status  Achieved      PT LONG TERM GOAL #2   Title  Pt will be able to pick up things from the floor without having to use the grabber    Status  Achieved      PT LONG TERM GOAL #3   Title  Pt will improve left shoulder AROM to flexion: 155, and abduction : 155 to demonstrate improved mobility    Status  Partially Met      PT LONG TERM GOAL #4   Title  Pt will obtain appropriate post surgical garments as needed    Status  Achieved            Plan - 05/07/19 1457    Clinical Impression Statement  Pt is progressing left shoulder mobility and AROM still with tightness around the incision and pull end range motions.  MMT performed today showing strong overall with mild deficit in extension compared to the right after latissimus flap  Added standing row and supine scapular series which pt performed well   and without pain.  Pt preferred looking at some low back  stretches instead of STM today.  Piriformis stretch on the right side reproduced some of her pain as well as standing exercises with anterior pelvic tilt.   Some of the pulling stretches were too much on her left arm/incision so PT gave her LTR, pirformis stretch, PPT/APT, and attempting bridges.  Pt reports on the way out that this is most likely her last visit due to copay.  Discussed the benefits of continued PT and pt reports she will do her best.  Currently has HEP for home care.    PT Treatment/Interventions  ADLs/Self Care Home Management;Moist Heat;Manual lymph drainage;Scar mobilization;Taping;Therapeutic exercise;Patient/family education;Manual techniques;Passive range of motion    PT Next Visit Plan  if returns continue left UE ROM and mobility, postural and core strengthening, STM latissimus and around incision    PT Home Exercise Plan  Access Code: [email or web address] , work on pelvic positioning with hair    Consulted and Agree with Plan of Care  Patient       Patient will benefit from skilled therapeutic intervention in order to improve the following deficits and impairments:     Visit Diagnosis: Aftercare following surgery for neoplasm  Stiffness of left shoulder, not elsewhere classified  Abnormal posture  H/O left mastectomy     Problem List Patient Active Problem List   Diagnosis Date Noted  . Genetic testing 07/20/2018  . Family history of breast cancer 06/23/2018  . Family history of prostate cancer 06/23/2018  . Trochanteric bursitis, right hip 06/23/2017  . Unilateral primary osteoarthritis, right knee 06/23/2017  . Recurrent breast cancer, left (HCC) 04/22/2017  . 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 Malignant neoplasm of overlapping sites of left breast in female, estrogen receptor positive (HCC) 02/09/2016  . History of breast cancer, left, T1, N0, Her2Neu postive. 12/31/2010  . Hx of laparoscopic gastric banding 12/07/2010  . Morbidly obese  (HCC) 11/04/2010  . Hypertension 11/04/2010  . Wears glasses 11/04/2010  . Gum disease 11/04/2010  . Breast lump 11/04/2010    Tevis, Kara R 05/07/2019, 3:01 PM  [facility] [address] [place], [place], [address] Phone: [phone]   Fax:  [phone]  Name: [name] MRN: [id number] Date of Birth: 03/27/1956   

## 2019-05-07 NOTE — Patient Instructions (Signed)
Access Code: [email or web address]  URL: [email or web address]  Date: 05/07/2019  Prepared by: Shan Levans   Exercises  Supine Posterior Pelvic Tilt - 10 reps - 1-3 sets - 1x daily - 7x weekly  Supine Lower Trunk Rotation - 10 reps - 1-3 sets - 5 second hold - 1x daily - 7x weekly  Supine Figure 4 Piriformis Stretch - 3 reps - 30 seconds hold - 1x daily - 7x weekly  Supine Bridge - 10 reps - 1-3 sets - 2-3second hold - 1x daily - 7x weekly  Standing Shoulder Abduction Slides at Wall - 10 reps - 1-3 sets - 20-30 seconds hold - 1x daily - 7x weekly  Standing Shoulder Flexion Wall Walk - 10 reps - 1-3 sets - 5 seconds hold - 1x daily - 7x weekly  Standing Row with Anchored Resistance - 10 reps - 1-3 sets - 1x daily - 3-4x weekly  Supine Shoulder Horizontal Abduction with Resistance - 10 reps - 1-3 sets - 1x daily - 7x weekly  Supine Shoulder External Rotation with Resistance - 10 reps - 1-3 sets - 1x daily - 7x weekly  Supine PNF D2 Flexion with Resistance - 10 reps - 1-3 sets - 1x daily - 7x weekly

## 2019-05-15 ENCOUNTER — Telehealth: Payer: Self-pay | Admitting: *Deleted

## 2019-05-15 NOTE — Telephone Encounter (Signed)
This RN spoke with pt per her call stating episode yesterday AM of " pain in right thigh and then everything went numb and I couldn't bear weight on my leg"  Above happened post walking down the stairs .  She sat and pain subsided and she was able to bear weight. Pain has not recurred.  [name]'s inquiry is - was this new pain related to current resuming of exemestane.  She called the pharmacy and was told above could be a side effect of the exemestane.  This RN discussed above not descriptive of joint pain relating to exemestane.  [name] states she had a latissmus flap breast reconstruction and has exercises she is doing to stretch muscles in the surgical area.  Of note she also has pain in her right hip - ongoing which she had xrays 2 years ago and was told she had " arthritis " in her hip.  [name] is very concern recent pain is related to the exemestane and is reluctant to resume.  This RN discussed above with plan for pt to hold on resuming for 1 week. She is to do her stretches as per PT and monitor for further discomfort. She is to call this RN in 1 week with update and probable restart of medication.  Next scheduled visit is April 2021  This note will be forwarded to MD for review and any further recommendations.

## 2019-05-28 ENCOUNTER — Ambulatory Visit: Payer: BC Managed Care – PPO | Attending: Plastic Surgery

## 2019-05-28 ENCOUNTER — Other Ambulatory Visit: Payer: Self-pay

## 2019-05-28 DIAGNOSIS — R293 Abnormal posture: Secondary | ICD-10-CM | POA: Insufficient documentation

## 2019-05-28 DIAGNOSIS — Z9012 Acquired absence of left breast and nipple: Secondary | ICD-10-CM | POA: Insufficient documentation

## 2019-05-28 DIAGNOSIS — Z483 Aftercare following surgery for neoplasm: Secondary | ICD-10-CM | POA: Diagnosis present

## 2019-05-28 DIAGNOSIS — M25612 Stiffness of left shoulder, not elsewhere classified: Secondary | ICD-10-CM | POA: Diagnosis present

## 2019-05-28 NOTE — Therapy (Signed)
[place], [place], [address] Phone: [phone]   Fax:  [phone]  Physical Therapy Progress Note  Progress Note Reporting Period 04/09/19 to 05/28/19  See note below for Objective Data and Assessment of Progress/Goals.      Patient Details  Name: [name] MRN: [id number] Date of Birth: 05/30/1955 Referring Provider (PT): Dr. Harlow Mares   Encounter Date: 05/28/2019  PT End of Session - 05/28/19 1107    Visit Number  6    Number of Visits  12    Date for PT Re-Evaluation  07/02/19    PT Start Time  1106    PT Stop Time  1200    PT Time Calculation (min)  54 min    Activity Tolerance  Patient tolerated treatment well    Behavior During Therapy  [facility] for tasks assessed/performed       Past Medical History:  Diagnosis Date  . 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 Breast lump    left  . Cancer ([place])   . Family history of breast cancer 06/23/2018  . Family history of prostate cancer 06/23/2018  . Gum disease   . Hyperlipidemia    diet and exercise controlled, no med  . Hypertension   . Pre-diabetes    borderline - diet and exercise contolled, no meds  . Sleep apnea    uses CPAP nightly    Past Surgical History:  Procedure Laterality Date  . BREAST LUMPECTOMY  2012   left  . COLONOSCOPY    . FOOT MASS EXCISION    . LAPAROSCOPIC GASTRIC BANDING    . LATISSIMUS FLAP TO BREAST Left 03/01/2019   Procedure: LEFT BREAST LATIISSIMUS FLAP;  Surgeon: Crissie Reese, MD;  Location: [facility];  Service: Plastics;  Laterality: Left;  [name] MASS EXCISION     back  . MASTECTOMY W/ SENTINEL NODE BIOPSY Left 03/01/2019   Procedure: LEFT MASTECTOMY WITH LEFT SENTINEL LYMPH NODE BIOPSY;  Surgeon: Alphonsa Overall, MD;  Location: [facility];  Service: General;  Laterality: Left;  . PLACEMENT OF BREAST IMPLANTS Left 03/01/2019   Procedure: PLACEMENT OF SALINE BREAST IMPLANT;   Surgeon: Crissie Reese, MD;  Location: [facility];  Service: Plastics;  Laterality: Left;  . PORTACATH PLACEMENT  02/11/11  . PORTACATH REMOVAL  MARCH 2014  . RESECTOSCOPIC POLYPECTOMY  2001   MYOMECTOMY  . SOFT TISSUE CYST EXCISION     back cyst  . VAGINAL MASS EXCISION      There were no vitals filed for this visit.  Subjective Assessment - 05/28/19 1107    Subjective  Pt reports that she has not been here in 3 weeks but she has been doing her band exercises at home. She continues with pressure in her L side under her axilla.    Pertinent History  Pt with left mastectomy with SLNB due to on 03/01/19 by Dr. Lucia Gaskins with latissimus flap and placement of saline breast implant by Dr. Harlow Mares from [facility].  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  Total of 9 lymph nodes taken out. PMH excellent.    Limitations  Lifting    Patient Stated Goals  get back to doing hair randomly    Currently in Pain?  No/denies  [facility] Adult PT Treatment/Exercise - 05/28/19 0001      Manual Therapy   Manual Therapy  Edema management;Manual Lymphatic Drainage (MLD)    Edema Management  Pt was provided w/1/2 inch gray foam over the posterior upper quadrant and wraps around under the axilla fo the lateral side of the L breast    Manual Lymphatic Drainage (MLD)  In Supine: 5 diaphragmatic breathes, L inguinal and bil axillary nodes, anterior inter-axillary anastomosis, L axillo-inguinal anastomosis (extra time spent here)             PT Education - 05/28/19 1158    Education Details  Pt was taught how to perform self MLD for the anterior trunk and side to help clear fluid out of the area in the posterior upper quadrant with an emphasis on direction, skin, stretch and pressure.    Person(s) Educated  Patient    Methods  Explanation;Demonstration;Tactile cues;Verbal cues;Handout     Comprehension  Verbalized understanding;Returned demonstration          PT Long Term Goals - 05/28/19 1112      PT LONG TERM GOAL #1   Title  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 without limitations    Baseline  Pt reports that she is back to doing hair about 2x/per week but occasionally get a pain in her R hip    Time  4    Period  Weeks    Status  Partially Met    Target Date  07/02/19      PT LONG TERM GOAL #2   Title  Pt will be able to pick up things from the floor without having to use the grabber    Baseline  Pt reports that she is able to pick things up off the floor without the grabber.    Status  Achieved      PT LONG TERM GOAL #3   Title  Pt will improve left shoulder AROM to flexion: 155, and abduction : 155 to demonstrate improved mobility    Baseline  flex: 125, abd: 105,  on 05/02/19: 155 and 121, Shoulder flexion 155, abduction 145 05/28/19    Time  4    Period  Weeks    Status  Partially Met    Target Date  07/02/19      PT LONG TERM GOAL #4   Title  Pt will obtain appropriate post surgical garments as needed    Baseline  Pt has received her compression bras from [employer].    Status  Achieved            Plan - 05/28/19 1107    Clinical Impression Statement  Pt presents to physical therapy following a 3 week gap in physical therapy services. She has been performing her exercises at home and has significantly improved her L shoulder abduction since her last measurements 3 weeks ago. She is making progress with all of her goals but is presenting with slight increase in edmea in the L posterior upper quadrant under the axilla and up into the upper back near incisional area. Pt was provided wiht 1/2 inch gray foam this session to place in this area and reported a decrease in pain. Discussed possible use of compression camisole to help decrease fluid in this area. Began teaching self MLD of the anterior inter-axillary anastomosis and L  axillo-inguinal anastomosis with extra time spent here to help clear fluid out of this area. Pt will benefit from  continued skilled physical therapy services in order to address the above limitations 1x/week for 4 weeks in order to address the above limitations.    Personal Factors and Comorbidities  Age;Comorbidity 2    Comorbidities  history of radiation and lymph node removal    Examination-Activity Limitations  Sleep;Bend;Reach Overhead    Examination-Participation Restrictions  Yard Work;Cleaning;Shop;Meal Prep    Rehab Potential  Excellent    PT Frequency  1x / week    PT Duration  6 weeks    PT Treatment/Interventions  ADLs/Self Care Home Management;Moist Heat;Manual lymph drainage;Scar mobilization;Taping;Therapeutic exercise;Patient/family education;Manual techniques;Passive range of motion    PT Next Visit Plan  how is foam?, self MLD, perform MLD of posterior L upper quadrant, postural and core strengthening, STM latissimus and around incision    PT Home Exercise Plan  Access Code: [email or web address] , work on pelvic positioning with hair    Recommended Other Services  compression camisole or vest?    Consulted and Agree with Plan of Care  Patient       Patient will benefit from skilled therapeutic intervention in order to improve the following deficits and impairments:  Decreased skin integrity, Increased muscle spasms, Decreased range of motion, Decreased scar mobility, Pain, Postural dysfunction, Impaired UE functional use  Visit Diagnosis: Aftercare following surgery for neoplasm  Stiffness of left shoulder, not elsewhere classified  Abnormal posture  H/O left mastectomy     Problem List Patient Active Problem List   Diagnosis Date Noted  . Genetic testing 07/20/2018  . Family history of breast cancer 06/23/2018  . Family history of prostate cancer 06/23/2018  . Trochanteric bursitis, right hip 06/23/2017  . Unilateral primary osteoarthritis, right knee 06/23/2017  .  Recurrent breast cancer, left ([place]) 04/22/2017  . 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 Malignant neoplasm of overlapping sites of left breast in female, estrogen receptor positive ([place]) 02/09/2016  . History of breast cancer, left, T1, N0, Her2Neu postive. 12/31/2010  . Hx of laparoscopic gastric banding 12/07/2010  . Morbidly obese ([place]) 11/04/2010  . Hypertension 11/04/2010  . Wears glasses 11/04/2010  . Gum disease 11/04/2010  . Breast lump 11/04/2010    Ander Purpura, PT 05/28/2019, 1:05 PM  [place], [place], [address] Phone: [phone]   Fax:  [phone]  Name: GIANI WINTHER MRN: [id number] Date of Birth: November 01, 1955

## 2019-05-28 NOTE — Patient Instructions (Signed)
Self manual lymph drainage: Perform this sequence once a day.  Only give enough pressure no your skin to make the skin move.  Diaphragmatic - Supine   Inhale through nose making navel move out toward hands. Exhale through puckered lips, hands follow navel in. Repeat _5__ times. Rest _10__ seconds between repeats.   Copyright  VHI. All rights reserved.  Hug yourself.  Do circles at your neck just above your collarbones.  Repeat this 10 times.  Axilla - One at a Time   Using full weight of flat hand and fingers at center of uninvolved armpit, make _10__ in-place circles.   Copyright  VHI. All rights reserved.  LEG: Inguinal Nodes Stimulation   With small finger side of hand against hip crease on involved side, gently perform circles at the crease. Repeat __10_ times.   Copyright  VHI. All rights reserved.  1) Axilla to Inguinal Nodes - Sweep   On involved side, sweep _4__ times from armpit along side of trunk to hip crease.  Now gently stretch skin from the involved side to the uninvolved side across the chest at the shoulder line.  Repeat that 4 times.

## 2019-06-04 ENCOUNTER — Ambulatory Visit: Payer: BC Managed Care – PPO | Admitting: Rehabilitation

## 2019-06-18 ENCOUNTER — Encounter: Payer: BC Managed Care – PPO | Admitting: Rehabilitation

## 2019-06-25 ENCOUNTER — Encounter: Payer: BC Managed Care – PPO | Admitting: Rehabilitation

## 2019-07-05 ENCOUNTER — Ambulatory Visit: Payer: BC Managed Care – PPO | Attending: Plastic Surgery

## 2019-07-05 ENCOUNTER — Other Ambulatory Visit: Payer: Self-pay

## 2019-07-05 DIAGNOSIS — M25612 Stiffness of left shoulder, not elsewhere classified: Secondary | ICD-10-CM | POA: Insufficient documentation

## 2019-07-05 DIAGNOSIS — R293 Abnormal posture: Secondary | ICD-10-CM | POA: Insufficient documentation

## 2019-07-05 DIAGNOSIS — Z9012 Acquired absence of left breast and nipple: Secondary | ICD-10-CM | POA: Insufficient documentation

## 2019-07-05 DIAGNOSIS — Z483 Aftercare following surgery for neoplasm: Secondary | ICD-10-CM | POA: Insufficient documentation

## 2019-07-05 NOTE — Therapy (Signed)
[place], [place], [address] Phone: [phone]   Fax:  [phone]  Physical Therapy Progress Note   Progress Note Reporting Period 05/28/19 to 07/05/19  See note below for Objective Data and Assessment of Progress/Goals.      Patient Details  Name: [name] MRN: [id number] Date of Birth: 1955/08/26 Referring Provider (PT): Dr. Harlow Mares   Encounter Date: 07/05/2019  PT End of Session - 07/05/19 0811    Visit Number  7    Number of Visits  12    Date for PT Re-Evaluation  08/09/19    PT Start Time  0807    PT Stop Time  0900    PT Time Calculation (min)  53 min    Activity Tolerance  Patient tolerated treatment well    Behavior During Therapy  [facility] for tasks assessed/performed       Past Medical History:  Diagnosis Date  . 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 Breast lump    left  . Cancer ([place])   . Family history of breast cancer 06/23/2018  . Family history of prostate cancer 06/23/2018  . Gum disease   . Hyperlipidemia    diet and exercise controlled, no med  . Hypertension   . Pre-diabetes    borderline - diet and exercise contolled, no meds  . Sleep apnea    uses CPAP nightly    Past Surgical History:  Procedure Laterality Date  . BREAST LUMPECTOMY  2012   left  . COLONOSCOPY    . FOOT MASS EXCISION    . LAPAROSCOPIC GASTRIC BANDING    . LATISSIMUS FLAP TO BREAST Left 03/01/2019   Procedure: LEFT BREAST LATIISSIMUS FLAP;  Surgeon: Crissie Reese, MD;  Location: [place];  Service: Plastics;  Laterality: Left;  [name] MASS EXCISION     back  . MASTECTOMY W/ SENTINEL NODE BIOPSY Left 03/01/2019   Procedure: LEFT MASTECTOMY WITH LEFT SENTINEL LYMPH NODE BIOPSY;  Surgeon: Alphonsa Overall, MD;  Location: [place];  Service: General;  Laterality: Left;  . PLACEMENT OF BREAST IMPLANTS Left 03/01/2019   Procedure: PLACEMENT OF SALINE BREAST IMPLANT;   Surgeon: Crissie Reese, MD;  Location: [place];  Service: Plastics;  Laterality: Left;  . PORTACATH PLACEMENT  02/11/11  . PORTACATH REMOVAL  MARCH 2014  . RESECTOSCOPIC POLYPECTOMY  2001   MYOMECTOMY  . SOFT TISSUE CYST EXCISION     back cyst  . VAGINAL MASS EXCISION      There were no vitals filed for this visit.  Subjective Assessment - 07/05/19 0811    Subjective  Pt reports that she has not been to therapy in 1 month due to contracting Covid 19. Pt states that she was not able to get her compression camisole due to her Covid Diagnosis but is going to get measured on 07/11/19.    Pertinent History  Pt with left mastectomy with SLNB due to on 03/01/19 by Dr. Lucia Gaskins with latissimus flap and placement of saline breast implant by Dr. Harlow Mares [facility].  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  Total of 9 lymph nodes taken out. PMH excellent.    Limitations  Lifting    Patient Stated Goals  get back to doing hair randomly    Currently in Pain?  No/denies    Multiple Pain Sites  No                       [facility] Adult PT Treatment/Exercise - 07/05/19 0001      Lumbar Exercises: Supine   Other Supine Lumbar Exercises  Supine scapular retraction 10x 5 seconds, Supine chin tuck 10x5 seconds, Leg lengthener extercise 5x 3 seconds, Leg lengthener with extension 5x 3 seconds    Other Supine Lumbar Exercises  Seated L lateral trunk stretch with hands behind head in abdct/ER w/VC to point elbow at the ceiling.       Manual Therapy   Manual Therapy  Edema management;Manual Lymphatic Drainage (MLD);Myofascial release;Soft tissue mobilization    Edema Management  Discussed where she needs compression on her L mid lateral trunk at distal incision where pt has alot of fibrotic tissue and fluid build up. Discussed trying shorts and camisole which ever provides better compression in that area.     Soft  tissue mobilization  STM to the distal end of the incision area at the L lateral trunk due to fibrotic tissue/scar tissue and fluid; significant softening at end of session.     Myofascial Release  Myofascial release from the L axilla to the inferior L breast longitudinally in supine and then in side lying with crossed hands from inferior L breast to the pelvis; pt reports stretching and symptoms with stretching that improved following myofascial release.     Manual Lymphatic Drainage (MLD)  In Supine: 5 diaphragmatic breathes, L inguinal and bil axillary nodes, anterior inter-axillary anastomosis, L axillo-inguinal anastomosis (extra time spent here) After STM went over pt technique with self MLD with a focus on skin stretch rather than sliding her hand for better effectiveness, going all the way from axilla to axilla and from axilla to groin.              PT Education - 07/05/19 0858    Education Details  Pt will perform postural stretching, strengthening exercises in order to facilitate facial movement to decreaes any adhesions and promote fluid flow. She will continue with self MLD at home working on technique.    Person(s) Educated  Patient    Methods  Explanation;Demonstration;Tactile cues;Verbal cues;Handout    Comprehension  Verbalized understanding;Returned demonstration          PT Long Term Goals - 07/05/19 0815      PT LONG TERM GOAL #1   Title  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 without limitations    Baseline  Pt was doing hair 2x/week but hasn't recently due to Covid diagnosis    Time  4    Period  Weeks    Status  Partially Met    Target Date  08/09/19      PT LONG TERM GOAL #2   Title  Pt will be able to pick up things from the floor without having to use the grabber    Baseline  Pt reports that she is able to pick things up off the floor without the grabber.    Status  Achieved      PT LONG TERM GOAL #3   Title  Pt will improve left shoulder AROM to  flexion: 155, and abduction : 155 to demonstrate improved mobility    Baseline  flex: 125, abd: 105,  on 05/02/19: 155 and 121, Shoulder flexion 155, abduction 145 05/28/19 L shoulder flexion 163, L shoulder abduction 155 degrees    Time  4  Period  Weeks    Status  Achieved      PT LONG TERM GOAL #4   Title  Pt will obtain appropriate post surgical garments as needed    Baseline  Pt has compression bras but is getting measured for the compression camisole on 07/11/19    Time  4    Period  Weeks    Status  Partially Met    Target Date  08/09/19            Plan - 07/05/19 0810    Clinical Impression Statement  Pt presents to physical therapy after a 4 weeks gap in services due to diagnosis of Covid 19. She has improved her shoulder ROM but continues with pain/edema in her L lateral trunk wall. She demonstrated MLD today with difficulty using technique; pt educated on MLD technique for hand placement due to anatomy of anastomosis. MLD was performed prior to STM/myofascial release and then pt performed self MLD after STM. Pt had improved technique by end of session. Modified Meeks decompression exercises were given to pt to perform at home in order to address tightness in the L lateral trunk wall along with seated trunk stretch. Pt will benefit from continued POC 1x/week for 4 weeks in order to address the above limitations to decrease risk for lymphedema in the UE and to clear fluid from the trunk.    Personal Factors and Comorbidities  Age;Comorbidity 2    Comorbidities  history of radiation and lymph node removal    Examination-Activity Limitations  Sleep;Bend;Reach Overhead    Examination-Participation Restrictions  Yard Work;Cleaning;Shop;Meal Prep    Rehab Potential  Excellent    PT Frequency  1x / week    PT Duration  4 weeks    PT Treatment/Interventions  ADLs/Self Care Home Management;Moist Heat;Manual lymph drainage;Scar mobilization;Taping;Therapeutic exercise;Patient/family  education;Manual techniques;Passive range of motion    PT Next Visit Plan  assess exercises, self MLD, perform MLD of posterior L upper quadrant, postural and core strengthening, STM latissimus and around incision    PT Home Exercise Plan  Access Code: [place] , work on pelvic positioning with hair    Consulted and Agree with Plan of Care  Patient       Patient will benefit from skilled therapeutic intervention in order to improve the following deficits and impairments:  Decreased skin integrity, Increased muscle spasms, Decreased range of motion, Decreased scar mobility, Pain, Postural dysfunction, Impaired UE functional use  Visit Diagnosis: Aftercare following surgery for neoplasm  Stiffness of left shoulder, not elsewhere classified  Abnormal posture  H/O left mastectomy     Problem List Patient Active Problem List   Diagnosis Date Noted  . Genetic testing 07/20/2018  . Family history of breast cancer 06/23/2018  . Family history of prostate cancer 06/23/2018  . Trochanteric bursitis, right hip 06/23/2017  . Unilateral primary osteoarthritis, right knee 06/23/2017  . Recurrent breast cancer, left ([place]) 04/22/2017  . 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 Malignant neoplasm of overlapping sites of left breast in female, estrogen receptor positive ([place]) 02/09/2016  . History of breast cancer, left, T1, N0, Her2Neu postive. 12/31/2010  . Hx of laparoscopic gastric banding 12/07/2010  . Morbidly obese ([place]) 11/04/2010  . Hypertension 11/04/2010  . Wears glasses 11/04/2010  . Gum disease 11/04/2010  . Breast lump 11/04/2010    Ander Purpura, PT 07/05/2019, 9:07 AM  [place], [place],  [address] Phone: [phone]   Fax:  [phone]  Name: [name] MRN: [id number] Date of Birth: 07/02/1955

## 2019-07-05 NOTE — Patient Instructions (Signed)
   1. Shoulder Press    Start in Decompression Exercise position. Press shoulders downward towards supporting surface. Hold __3-5__ seconds while counting out loud. Repeat _10___ times. Do _1-2___ times per day.   2. Head Press    Bring cervical spine (neck) into neutral position (by either tucking the chin towards the chest or tilting the chin upward). Feel weight on back of head. Press head downward into supporting surface.    Hold _5__ seconds. Repeat _10__ times. Do _1-2__ times per day.   3. Leg Lengthener    Straighten one leg. Pull toes AND forefoot toward knee, extend heel. Lengthen leg by pulling pelvis away from ribs. Hold _2-3__ seconds. Relax. Repeat __5__ times. Do other leg.  Surface: floor   4. Leg Press    Straighten one leg down to floor keeping leg aligned with hip. Pull toes AND forefoot toward knee; extend heel.  Press entire leg downward (as if pressing leg into sandy beach). DO NOT BEND KNEE. Hold _2-3__ seconds. Do __5__ times. Repeat with other leg.   Trunk and Shoulder Stretch    Grip fingers behind head. Bend to right side. Hold stretch for _10__ seconds. Repeat, bending to left side. Do not push head forward. Repeat _3__ times. Do _2-3__ times per day.  Copyright  VHI. All rights reserved.

## 2019-07-11 ENCOUNTER — Other Ambulatory Visit: Payer: Self-pay

## 2019-07-11 ENCOUNTER — Ambulatory Visit: Payer: BC Managed Care – PPO | Attending: Plastic Surgery

## 2019-07-11 DIAGNOSIS — Z483 Aftercare following surgery for neoplasm: Secondary | ICD-10-CM | POA: Diagnosis present

## 2019-07-11 DIAGNOSIS — M25612 Stiffness of left shoulder, not elsewhere classified: Secondary | ICD-10-CM | POA: Insufficient documentation

## 2019-07-11 DIAGNOSIS — Z9012 Acquired absence of left breast and nipple: Secondary | ICD-10-CM | POA: Diagnosis present

## 2019-07-11 DIAGNOSIS — R293 Abnormal posture: Secondary | ICD-10-CM | POA: Insufficient documentation

## 2019-07-11 NOTE — Therapy (Signed)
[place], [place], [address] Phone: [phone]   Fax:  [phone]  Physical Therapy Treatment  Patient Details  Name: ATIYAH BAUER MRN: [id number] Date of Birth: 08/26/1955 Referring Provider (PT): Dr. Harlow Mares   Encounter Date: 07/11/2019  PT End of Session - 07/11/19 0909    Visit Number  8    Number of Visits  12    Date for PT Re-Evaluation  08/09/19    PT Start Time  0907    PT Stop Time  1001    PT Time Calculation (min)  54 min    Activity Tolerance  Patient tolerated treatment well    Behavior During Therapy  [facility] for tasks assessed/performed       Past Medical History:  Diagnosis Date  . 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 Breast lump    left  . Cancer ([place])   . Family history of breast cancer 06/23/2018  . Family history of prostate cancer 06/23/2018  . Gum disease   . Hyperlipidemia    diet and exercise controlled, no med  . Hypertension   . Pre-diabetes    borderline - diet and exercise contolled, no meds  . Sleep apnea    uses CPAP nightly    Past Surgical History:  Procedure Laterality Date  . BREAST LUMPECTOMY  2012   left  . COLONOSCOPY    . FOOT MASS EXCISION    . LAPAROSCOPIC GASTRIC BANDING    . LATISSIMUS FLAP TO BREAST Left 03/01/2019   Procedure: LEFT BREAST LATIISSIMUS FLAP;  Surgeon: Crissie Reese, MD;  Location: [place];  Service: Plastics;  Laterality: Left;  [name] MASS EXCISION     back  . MASTECTOMY W/ SENTINEL NODE BIOPSY Left 03/01/2019   Procedure: LEFT MASTECTOMY WITH LEFT SENTINEL LYMPH NODE BIOPSY;  Surgeon: Alphonsa Overall, MD;  Location: [place];  Service: General;  Laterality: Left;  . PLACEMENT OF BREAST IMPLANTS Left 03/01/2019   Procedure: PLACEMENT OF SALINE BREAST IMPLANT;  Surgeon: Crissie Reese, MD;  Location: [place];  Service: Plastics;  Laterality: Left;  . PORTACATH PLACEMENT  02/11/11  . PORTACATH REMOVAL   MARCH 2014  . RESECTOSCOPIC POLYPECTOMY  2001   MYOMECTOMY  . SOFT TISSUE CYST EXCISION     back cyst  . VAGINAL MASS EXCISION      There were no vitals filed for this visit.  Subjective Assessment - 07/11/19 0909    Subjective  Pt reports that her L breast is a little sore. She states that she is going to second to nature this afternoon to be measured for a camisole.    Pertinent History  Pt with left mastectomy with SLNB due to on 03/01/19 by Dr. Lucia Gaskins with latissimus flap and placement of saline breast implant by Dr. Harlow Mares from [facility].  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  Total of 9 lymph nodes taken out. PMH excellent.    Limitations  Lifting    Patient Stated Goals  get back to doing hair randomly    Currently in Pain?  Yes    Pain Score  3     Pain Location  Breast    Pain Orientation  Left    Pain Descriptors / Indicators  Sore    Pain Type  Surgical pain    Pain Onset  In the past 7 days                       [facility] Adult PT Treatment/Exercise - 07/11/19 0001      Lumbar Exercises: Supine   Other Supine Lumbar Exercises  Supine lower trunk rotation stretch 3x 20 seconds with VC for pelvic tilt and light stretching.     Other Supine Lumbar Exercises  Supine chin tuck 10x 5 sec, Supine pelvic tilt 10x 5 seconds, Supine scapular retraction 10x 5 seconds with VC for correct movement and muscle tightening througout with good form from patient.        Manual Therapy   Manual Therapy  Manual Lymphatic Drainage (MLD);Myofascial release;Soft tissue mobilization    Soft tissue mobilization  STM to the distal end of the incision area at the L lateral trunk due to fibrotic tissue/scar tissue and fluid, L anterior deltoid, mid anterior serratus, superior portion of the L pectoralis major; moderate softening at end of session.     Myofascial Release  Myofascial release from the L  axilla to the inferior L breast longitudinally in supine and then in side lying with crossed hands from inferior L breast to the pelvis and axilla to inferiorL breast, anterior/inferior portion of L breast and anterior abdominal wall; pt reports stretching and symptoms with stretching that improved following myofascial release.     Manual Lymphatic Drainage (MLD)  In Supine: 5 diaphragmatic breathes, L inguinal and bil axillary nodes, anterior inter-axillary anastomosis, L axillo-inguinal anastomosis (extra time spent here), L side-lying L axillo-inguinal anastomosis, posterior inter-axillary anastomosis, Supine: medial to lateral L brachium and lateral L brachium, re-worked all surfaces; discussed skin pressure, direction and technique with pt for self MLD throughout MLD             PT Education - 07/11/19 2993    Education Details  Pt will continue with exercises and self MLD at home. She will wear her compression camisole as soon as she recieves it.    Person(s) Educated  Patient    Methods  Explanation    Comprehension  Verbalized understanding          PT Long Term Goals - 07/05/19 0815      PT LONG TERM GOAL #1   Title  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 without limitations    Baseline  Pt was doing hair 2x/week but hasn't recently due to Covid diagnosis    Time  4    Period  Weeks    Status  Partially Met    Target Date  08/09/19      PT LONG TERM GOAL #2   Title  Pt will be able to pick up things from the floor without having to use the grabber    Baseline  Pt reports that she is able to pick things up off the floor without the grabber.    Status  Achieved      PT LONG TERM GOAL #3   Title  Pt will improve left shoulder AROM to flexion: 155, and abduction : 155 to demonstrate improved mobility    Baseline  flex: 125, abd: 105,  on 05/02/19: 155 and 121, Shoulder flexion 155, abduction 145 05/28/19 L shoulder flexion 163, L shoulder abduction 155 degrees    Time   4    Period  Weeks    Status  Achieved      PT LONG TERM GOAL #4  Title  Pt will obtain appropriate post surgical garments as needed    Baseline  Pt has compression bras but is getting measured for the compression camisole on 07/11/19    Time  4    Period  Weeks    Status  Partially Met    Target Date  08/09/19            Plan - 07/11/19 0908    Clinical Impression Statement  Pt continues with tightness/tenderness noted at the incision area at the L lateral trunk. Noted tightness/tenderness in the superior pectoralis major, mid anterior seratus and anterior deltoid; decreased moderately following light STM. Myofascial release was performed along the L lateral trunk wall and L breast due to continued adhesion and tightness that improved by end of session. Performed trunk stretching/strengthening this session with pt to address core weakness/postural deficits to decrease stress on anterior/posterior upper trunk musculature . MLD was peformed following STM with extra time spent at the L axillo-inguinal andposterior inter-axillary anastomosis. Pt will benefit from continued POC at this time.    Personal Factors and Comorbidities  Age;Comorbidity 2    Comorbidities  history of radiation and lymph node removal    Examination-Activity Limitations  Sleep;Bend;Reach Overhead    Examination-Participation Restrictions  Yard Work;Cleaning;Shop;Meal Prep    Rehab Potential  Excellent    PT Frequency  1x / week    PT Duration  4 weeks    PT Treatment/Interventions  ADLs/Self Care Home Management;Moist Heat;Manual lymph drainage;Scar mobilization;Taping;Therapeutic exercise;Patient/family education;Manual techniques;Passive range of motion    PT Next Visit Plan  assess exercises, self MLD, continue with [facility] and progress postural/core strengthening, STM    PT Home Exercise Plan  Access Code: [place] , work on pelvic positioning with hair    Consulted and Agree with Plan of Care  Patient        Patient will benefit from skilled therapeutic intervention in order to improve the following deficits and impairments:  Decreased skin integrity, Increased muscle spasms, Decreased range of motion, Decreased scar mobility, Pain, Postural dysfunction, Impaired UE functional use  Visit Diagnosis: Aftercare following surgery for neoplasm  Stiffness of left shoulder, not elsewhere classified  Abnormal posture  H/O left mastectomy     Problem List Patient Active Problem List   Diagnosis Date Noted  . Genetic testing 07/20/2018  . Family history of breast cancer 06/23/2018  . Family history of prostate cancer 06/23/2018  . Trochanteric bursitis, right hip 06/23/2017  . Unilateral primary osteoarthritis, right knee 06/23/2017  . Recurrent breast cancer, left ([place]) 04/22/2017  . 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 Malignant neoplasm of overlapping sites of left breast in female, estrogen receptor positive ([place]) 02/09/2016  . History of breast cancer, left, T1, N0, Her2Neu postive. 12/31/2010  . Hx of laparoscopic gastric banding 12/07/2010  . Morbidly obese ([place]) 11/04/2010  . Hypertension 11/04/2010  . Wears glasses 11/04/2010  . Gum disease 11/04/2010  . Breast lump 11/04/2010    Ander Purpura, PT 07/11/2019, 10:02 AM  [place], [place], [address] Phone: [phone]   Fax:  [phone]  Name: YARA TOMKINSON MRN: [id number] Date of Birth: 04-13-1956

## 2019-07-18 ENCOUNTER — Ambulatory Visit: Payer: BC Managed Care – PPO

## 2019-07-18 ENCOUNTER — Other Ambulatory Visit: Payer: Self-pay

## 2019-07-18 DIAGNOSIS — Z483 Aftercare following surgery for neoplasm: Secondary | ICD-10-CM | POA: Diagnosis not present

## 2019-07-18 DIAGNOSIS — Z9012 Acquired absence of left breast and nipple: Secondary | ICD-10-CM

## 2019-07-18 DIAGNOSIS — R293 Abnormal posture: Secondary | ICD-10-CM

## 2019-07-18 DIAGNOSIS — M25612 Stiffness of left shoulder, not elsewhere classified: Secondary | ICD-10-CM

## 2019-07-18 NOTE — Therapy (Signed)
[place], [place], [address] Phone: [phone]   Fax:  [phone]  Physical Therapy Treatment  Patient Details  Name: [name] MRN: [id number] Date of Birth: 1955-08-02 Referring Provider (PT): Dr. Harlow Mares   Encounter Date: 07/18/2019  PT End of Session - 07/18/19 0906    Visit Number  9    Number of Visits  12    Date for PT Re-Evaluation  08/09/19    PT Start Time  0905    PT Stop Time  1000    PT Time Calculation (min)  55 min    Activity Tolerance  Patient tolerated treatment well    Behavior During Therapy  [facility] for tasks assessed/performed       Past Medical History:  Diagnosis Date  . 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 Breast lump    left  . Cancer ([place])   . Family history of breast cancer 06/23/2018  . Family history of prostate cancer 06/23/2018  . Gum disease   . Hyperlipidemia    diet and exercise controlled, no med  . Hypertension   . Pre-diabetes    borderline - diet and exercise contolled, no meds  . Sleep apnea    uses CPAP nightly    Past Surgical History:  Procedure Laterality Date  . BREAST LUMPECTOMY  2012   left  . COLONOSCOPY    . FOOT MASS EXCISION    . LAPAROSCOPIC GASTRIC BANDING    . LATISSIMUS FLAP TO BREAST Left 03/01/2019   Procedure: LEFT BREAST LATIISSIMUS FLAP;  Surgeon: Crissie Reese, MD;  Location: [place];  Service: Plastics;  Laterality: Left;  [name] MASS EXCISION     back  . MASTECTOMY W/ SENTINEL NODE BIOPSY Left 03/01/2019   Procedure: LEFT MASTECTOMY WITH LEFT SENTINEL LYMPH NODE BIOPSY;  Surgeon: Alphonsa Overall, MD;  Location: [place];  Service: General;  Laterality: Left;  . PLACEMENT OF BREAST IMPLANTS Left 03/01/2019   Procedure: PLACEMENT OF SALINE BREAST IMPLANT;  Surgeon: Crissie Reese, MD;  Location: [place];  Service: Plastics;  Laterality: Left;  . PORTACATH PLACEMENT  02/11/11  . PORTACATH REMOVAL   MARCH 2014  . RESECTOSCOPIC POLYPECTOMY  2001   MYOMECTOMY  . SOFT TISSUE CYST EXCISION     back cyst  . VAGINAL MASS EXCISION      There were no vitals filed for this visit.  Subjective Assessment - 07/18/19 0907    Subjective  Pt reports that she continues with pressure in her L side. That feels like a cinderblock. She states it isn't painful; just pressure. Pt states that she received a compression camisole that she has been wearing it at night.    Pertinent History  Pt with left mastectomy with SLNB due to on 03/01/19 by Dr. Lucia Gaskins with latissimus flap and placement of saline breast implant by Dr. Harlow Mares from [facility].  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  Total of 9 lymph nodes taken out. PMH excellent.    Limitations  Lifting    Patient Stated Goals  get back to doing hair randomly    Currently in Pain?  Yes    Pain Score  3     Pain Location  Back    Pain Orientation  Left    Pain Descriptors / Indicators  Tightness  Pain Type  Surgical pain    Pain Onset  More than a month ago                       [facility] Adult PT Treatment/Exercise - 07/18/19 0001      Shoulder Exercises: Seated   Other Seated Exercises  Seated rotation stretch w/arms crossed across chest 5x 5 second hold Bil with demonstration for correct movement.     Other Seated Exercises  Thoracic side bending w/demonstration and laced fingers forearm supination overhead into shoulder fleixon then stretching thoracic spine R/L 5 second hold then rest 5x with VC to avoid stretching at lumbar spine.       Shoulder Exercises: Sidelying   External Rotation  Strengthening;Left;10 reps    External Rotation Weight (lbs)  2    External Rotation Limitations  tactile cueing with rolled towel under elbow for alignment VC to avoid pain and for slow movement.     ABduction  Strengthening;Left;10 reps    ABduction Weight  (lbs)  2    ABduction Limitations  tactile cueing and VC for correct movement.     Other Sidelying Exercises  Open book only on the L 10x 2#  tactile cueing and VC for correct movement.       Shoulder Exercises: Standing   Other Standing Exercises  Shoulder V and I 10x demonstration for correct movement and VC 1x to prevent shoulder elevation w/1# wgt       Manual Therapy   Manual Therapy  Manual Lymphatic Drainage (MLD);Myofascial release;Soft tissue mobilization    Soft tissue mobilization  Slightly deeper IASTM along the rhomboids, supraspinatus and serratus; mild improvement following STM/IASTM     Myofascial Release  Myofascial release in side lying longitudinally from the pelvis to the inferior L breast and from the L breast into the posterior back and along the incision on the back. Light IASTM along incision and lateral L breast for myofascial release. bevel side up    Manual Lymphatic Drainage (MLD)  In side-lying: L axillary and L inguinal nodes worked the L axillo-inguinal anastomosis  multiple times.              PT Education - 07/18/19 0957    Education Details  Pt will contoinue with exercise and self MLD at home. She will try wearing her compression camisole during the day instead of at night and she will look to see what size weights she has at home.    Person(s) Educated  Patient    Methods  Explanation    Comprehension  Verbalized understanding          PT Long Term Goals - 07/05/19 0815      PT LONG TERM GOAL #1   Title  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 without limitations    Baseline  Pt was doing hair 2x/week but hasn't recently due to Covid diagnosis    Time  4    Period  Weeks    Status  Partially Met    Target Date  08/09/19      PT LONG TERM GOAL #2   Title  Pt will be able to pick up things from the floor without having to use the grabber    Baseline  Pt reports that she is able to pick things up off the floor without the grabber.     Status  Achieved      PT LONG TERM GOAL #  3   Title  Pt will improve left shoulder AROM to flexion: 155, and abduction : 155 to demonstrate improved mobility    Baseline  flex: 125, abd: 105,  on 05/02/19: 155 and 121, Shoulder flexion 155, abduction 145 05/28/19 L shoulder flexion 163, L shoulder abduction 155 degrees    Time  4    Period  Weeks    Status  Achieved      PT LONG TERM GOAL #4   Title  Pt will obtain appropriate post surgical garments as needed    Baseline  Pt has compression bras but is getting measured for the compression camisole on 07/11/19    Time  4    Period  Weeks    Status  Partially Met    Target Date  08/09/19            Plan - 07/18/19 0906    Clinical Impression Statement  Pt continues with tightness noted in her L lateral trunk area over there anterior scalenes, rhomboids and an supraspinatus; decreased mildly following IASTM/STM. Myofascial release continues in this area over the posterior incision and lateral L breast due to tightness; crepitus noted that slightly decreased following myofascial release and IASTM. Pt was able to tolerate scapular strengthening this session w/o an increase in pain from baseline; minimal cueing required to prevent compensation w/shoulder hiking and momentum. Pt will benefit from continued POC at this time.    Personal Factors and Comorbidities  Age;Comorbidity 2    Comorbidities  history of radiation and lymph node removal    Examination-Activity Limitations  Sleep;Bend;Reach Overhead    Examination-Participation Restrictions  Yard Work;Cleaning;Shop;Meal Prep    Rehab Potential  Excellent    PT Frequency  1x / week    PT Duration  4 weeks    PT Treatment/Interventions  ADLs/Self Care Home Management;Moist Heat;Manual lymph drainage;Scar mobilization;Taping;Therapeutic exercise;Patient/family education;Manual techniques;Passive range of motion    PT Next Visit Plan  assess exercises update HEP , self MLD, continue with MLD  and progress postural/core strengthening, STM    PT Home Exercise Plan  Access Code: [place] , work on pelvic positioning with hair    Consulted and Agree with Plan of Care  Patient       Patient will benefit from skilled therapeutic intervention in order to improve the following deficits and impairments:  Decreased skin integrity, Increased muscle spasms, Decreased range of motion, Decreased scar mobility, Pain, Postural dysfunction, Impaired UE functional use  Visit Diagnosis: Aftercare following surgery for neoplasm  Stiffness of left shoulder, not elsewhere classified  Abnormal posture  H/O left mastectomy     Problem List Patient Active Problem List   Diagnosis Date Noted  . Genetic testing 07/20/2018  . Family history of breast cancer 06/23/2018  . Family history of prostate cancer 06/23/2018  . Trochanteric bursitis, right hip 06/23/2017  . Unilateral primary osteoarthritis, right knee 06/23/2017  . Recurrent breast cancer, left ([place]) 04/22/2017  . 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 Malignant neoplasm of overlapping sites of left breast in female, estrogen receptor positive ([place]) 02/09/2016  . History of breast cancer, left, T1, N0, Her2Neu postive. 12/31/2010  . Hx of laparoscopic gastric banding 12/07/2010  . Morbidly obese ([place]) 11/04/2010  . Hypertension 11/04/2010  . Wears glasses 11/04/2010  . Gum disease 11/04/2010  . Breast lump 11/04/2010    Ander Purpura, PT 07/18/2019, 10:14 AM  [place], [place], [address] Phone: [phone]   Fax:  [phone]  Name: [name] MRN: [id number] Date of Birth: [birth year]

## 2019-07-25 ENCOUNTER — Ambulatory Visit: Payer: BC Managed Care – PPO

## 2019-08-01 ENCOUNTER — Ambulatory Visit: Payer: BC Managed Care – PPO

## 2019-08-01 ENCOUNTER — Other Ambulatory Visit: Payer: Self-pay

## 2019-08-01 DIAGNOSIS — Z483 Aftercare following surgery for neoplasm: Secondary | ICD-10-CM

## 2019-08-01 DIAGNOSIS — Z9012 Acquired absence of left breast and nipple: Secondary | ICD-10-CM

## 2019-08-01 DIAGNOSIS — M25612 Stiffness of left shoulder, not elsewhere classified: Secondary | ICD-10-CM

## 2019-08-01 DIAGNOSIS — R293 Abnormal posture: Secondary | ICD-10-CM

## 2019-08-01 NOTE — Therapy (Signed)
[place], [place], [address] Phone: [phone]   Fax:  [phone]  Physical Therapy Treatment  Patient Details  Name: [name] MRN: [id number] Date of Birth: [birth year] Referring Provider (PT): Dr. Harlow Mares   Encounter Date: 08/01/2019  PT End of Session - 08/01/19 0906    Visit Number  10    Number of Visits  12    Date for PT Re-Evaluation  08/09/19    PT Start Time  0907    PT Stop Time  1003    PT Time Calculation (min)  56 min    Activity Tolerance  Patient tolerated treatment well    Behavior During Therapy  [facility] for tasks assessed/performed       Past Medical History:  Diagnosis Date  . 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 Breast lump    left  . Cancer ([place])   . Family history of breast cancer 06/23/2018  . Family history of prostate cancer 06/23/2018  . Gum disease   . Hyperlipidemia    diet and exercise controlled, no med  . Hypertension   . Pre-diabetes    borderline - diet and exercise contolled, no meds  . Sleep apnea    uses CPAP nightly    Past Surgical History:  Procedure Laterality Date  . BREAST LUMPECTOMY  2012   left  . COLONOSCOPY    . FOOT MASS EXCISION    . LAPAROSCOPIC GASTRIC BANDING    . LATISSIMUS FLAP TO BREAST Left 03/01/2019   Procedure: LEFT BREAST LATIISSIMUS FLAP;  Surgeon: Crissie Reese, MD;  Location: [place];  Service: Plastics;  Laterality: Left;  [name] MASS EXCISION     back  . MASTECTOMY W/ SENTINEL NODE BIOPSY Left 03/01/2019   Procedure: LEFT MASTECTOMY WITH LEFT SENTINEL LYMPH NODE BIOPSY;  Surgeon: Alphonsa Overall, MD;  Location: [place];  Service: General;  Laterality: Left;  . PLACEMENT OF BREAST IMPLANTS Left 03/01/2019   Procedure: PLACEMENT OF SALINE BREAST IMPLANT;  Surgeon: Crissie Reese, MD;  Location: [place];  Service: Plastics;  Laterality: Left;  . PORTACATH PLACEMENT  02/11/11  . PORTACATH  REMOVAL  MARCH 2014  . RESECTOSCOPIC POLYPECTOMY  2001   MYOMECTOMY  . SOFT TISSUE CYST EXCISION     back cyst  . VAGINAL MASS EXCISION      There were no vitals filed for this visit.  Subjective Assessment - 08/01/19 0909    Subjective  Pt reports that she saw Dr. Harlow Mares since her last visit. He wanted to clean up her surgery but pt declined but pt states that she declined. Pt states she has not started wearing her tank during the day but has been wearing compression bras. She is wearing a tank at night but it does not have a shelf bra in it.    Pertinent History  Pt with left mastectomy with SLNB due to on 03/01/19 by Dr. Lucia Gaskins with latissimus flap and placement of saline breast implant by Dr. Harlow Mares from [facility].  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  Total of 9 lymph nodes taken out. PMH excellent.    Limitations  Lifting    Patient Stated Goals  get back to doing hair randomly    Currently in Pain?  Yes    Pain Score  3  Pain Location  Back    Pain Orientation  Left    Pain Descriptors / Indicators  Tightness    Pain Type  Surgical pain    Pain Onset  More than a month ago            LYMPHEDEMA/ONCOLOGY QUESTIONNAIRE - 08/01/19 0921      Left Upper Extremity Lymphedema   15 cm Proximal to Olecranon Process  39 cm    10 cm Proximal to Olecranon Process  39.8 cm    Olecranon Process  28.9 cm    15 cm Proximal to Ulnar Styloid Process  30 cm    10 cm Proximal to Ulnar Styloid Process  25.9 cm    Just Proximal to Ulnar Styloid Process  18.8 cm    Across Hand at [employer]  18.5 cm    At [place] of 2nd Digit  5.8 cm                [facility] Adult PT Treatment/Exercise - 08/01/19 0001      Shoulder Exercises: Sidelying   External Rotation  Strengthening;Left;10 reps    External Rotation Weight (lbs)  1    External Rotation Limitations  tactile cues for correct movement.  Less weight due to reports of significant muscle soreness after last session.     ABduction  Strengthening;Left;10 reps    ABduction Weight (lbs)  1    ABduction Limitations  tactile cueing for correct movement less weight due to reports of significant soreness after last session. Inferior myofascial glide below the axilla during abduction to decrease pain at the inferior L breast and improve ROM.     Other Sidelying Exercises  Open book only on the L 10x 1#  tactile cueing and VC for correct movement.       Shoulder Exercises: Pulleys   ABduction  2 minutes    ABduction Limitations  after myofascial release w/VC to avoid compensation into scaption and end range stretch.       Manual Therapy   Manual Therapy  Myofascial release    Myofascial Release  Myofascial relelase along the L lateral/posterior trunk wall around incision area and along the inferior/lateral portion of the L breast slowly with extra time spent on stretch, in multiple directions.              PT Education - 08/01/19 0956    Education Details  Access Code: [email or web address], pt will perform LB exercises EOD then perform her new scapular stabilization/ROM activities every day.    Person(s) Educated  Patient    Methods  Explanation    Comprehension  Verbalized understanding          PT Long Term Goals - 07/05/19 0815      PT LONG TERM GOAL #1   Title  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 without limitations    Baseline  Pt was doing hair 2x/week but hasn't recently due to Covid diagnosis    Time  4    Period  Weeks    Status  Partially Met    Target Date  08/09/19      PT LONG TERM GOAL #2   Title  Pt will be able to pick up things from the floor without having to use the grabber    Baseline  Pt reports that she is able to pick things up off the floor without the grabber.    Status  Achieved  PT LONG TERM GOAL #3   Title  Pt will improve left shoulder AROM to flexion: 155, and abduction : 155 to  demonstrate improved mobility    Baseline  flex: 125, abd: 105,  on 05/02/19: 155 and 121, Shoulder flexion 155, abduction 145 05/28/19 L shoulder flexion 163, L shoulder abduction 155 degrees    Time  4    Period  Weeks    Status  Achieved      PT LONG TERM GOAL #4   Title  Pt will obtain appropriate post surgical garments as needed    Baseline  Pt has compression bras but is getting measured for the compression camisole on 07/11/19    Time  4    Period  Weeks    Status  Partially Met    Target Date  08/09/19            Plan - 08/01/19 0905    Clinical Impression Statement  Pt presents with significant myofascial tightness over her reconstruction incision area over the L latissimuc dorsi; slight release following prolonged myofascial moibilization in mulitple directions across the anterior/posterior trunk and into the inferior L breast. Pt had improved P/ROM following myofascial release and performed exercises into increased mobility; HEP was updated. Pt will benefit from continued POC at this time.    Personal Factors and Comorbidities  Age;Comorbidity 2    Comorbidities  history of radiation and lymph node removal    Examination-Activity Limitations  Sleep;Bend;Reach Overhead    Examination-Participation Restrictions  Yard Work;Cleaning;Shop;Meal Prep    Rehab Potential  Excellent    PT Frequency  1x / week    PT Duration  4 weeks    PT Treatment/Interventions  ADLs/Self Care Home Management;Moist Heat;Manual lymph drainage;Scar mobilization;Taping;Therapeutic exercise;Patient/family education;Manual techniques;Passive range of motion    PT Next Visit Plan  ask about new HEP, continue myofascial release/mobility, continue with MLD and progress postural/core strengthening, STM    PT Home Exercise Plan  Access Code: [place] , work on pelvic positioning with hair, Access Code: [email or web address]    Consulted and Agree with Plan of Care  Patient       Patient will benefit from skilled  therapeutic intervention in order to improve the following deficits and impairments:  Decreased skin integrity, Increased muscle spasms, Decreased range of motion, Decreased scar mobility, Pain, Postural dysfunction, Impaired UE functional use  Visit Diagnosis: Aftercare following surgery for neoplasm  Stiffness of left shoulder, not elsewhere classified  Abnormal posture  H/O left mastectomy     Problem List Patient Active Problem List   Diagnosis Date Noted  . Genetic testing 07/20/2018  . Family history of breast cancer 06/23/2018  . Family history of prostate cancer 06/23/2018  . Trochanteric bursitis, right hip 06/23/2017  . Unilateral primary osteoarthritis, right knee 06/23/2017  . Recurrent breast cancer, left ([place]) 04/22/2017  . 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 Malignant neoplasm of overlapping sites of left breast in female, estrogen receptor positive ([place]) 02/09/2016  . History of breast cancer, left, T1, N0, Her2Neu postive. 12/31/2010  . Hx of laparoscopic gastric banding 12/07/2010  . Morbidly obese ([place]) 11/04/2010  . Hypertension 11/04/2010  . Wears glasses 11/04/2010  . Gum disease 11/04/2010  . Breast lump 11/04/2010    Ander Purpura, PT 08/01/2019, 11:55 AM  [place], [place], [address] Phone: [phone]   Fax:  [phone]  Name: [name] MRN:  [id number] Date of Birth: 1956/01/14

## 2019-08-01 NOTE — Patient Instructions (Signed)
Access Code: [email or web address] URL: [email or web address] Date: 08/01/2019 Prepared by: Tomma Rakers  Exercises Sidelying Shoulder External Rotation Dumbbell - 1 x daily - 7 x weekly - 1 sets - 10 reps - use 1 lb weight hold Sidelying Shoulder Horizontal Abduction - 1 x daily - 7 x weekly - 1 sets - 10 reps - use 1 lb weight hold Sidelying Shoulder Abduction Palm Forward - 1 x daily - 7 x weekly - 1 sets - 10 reps - use 1 lb weight hold

## 2019-08-08 ENCOUNTER — Other Ambulatory Visit: Payer: Self-pay

## 2019-08-08 ENCOUNTER — Ambulatory Visit: Payer: BC Managed Care – PPO

## 2019-08-08 DIAGNOSIS — Z483 Aftercare following surgery for neoplasm: Secondary | ICD-10-CM

## 2019-08-08 DIAGNOSIS — M25612 Stiffness of left shoulder, not elsewhere classified: Secondary | ICD-10-CM

## 2019-08-08 DIAGNOSIS — R293 Abnormal posture: Secondary | ICD-10-CM

## 2019-08-08 DIAGNOSIS — Z9012 Acquired absence of left breast and nipple: Secondary | ICD-10-CM

## 2019-08-08 NOTE — Therapy (Signed)
[place], [place], [address] Phone: [phone]   Fax:  [phone]  Physical Therapy Treatment  Patient Details  Name: [name] MRN: [id number] Date of Birth: 02-24-1956 Referring Provider (PT): Dr. Harlow Mares   Encounter Date: 08/08/2019  PT End of Session - 08/08/19 0904    Visit Number  11    Number of Visits  16    Date for PT Re-Evaluation  10/03/19    PT Start Time  0903    PT Stop Time  1004    PT Time Calculation (min)  61 min    Activity Tolerance  Patient tolerated treatment well    Behavior During Therapy  [facility] for tasks assessed/performed       Past Medical History:  Diagnosis Date  . 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 Breast lump    left  . Cancer ([place])   . Family history of breast cancer 06/23/2018  . Family history of prostate cancer 06/23/2018  . Gum disease   . Hyperlipidemia    diet and exercise controlled, no med  . Hypertension   . Pre-diabetes    borderline - diet and exercise contolled, no meds  . Sleep apnea    uses CPAP nightly    Past Surgical History:  Procedure Laterality Date  . BREAST LUMPECTOMY  2012   left  . COLONOSCOPY    . FOOT MASS EXCISION    . LAPAROSCOPIC GASTRIC BANDING    . LATISSIMUS FLAP TO BREAST Left 03/01/2019   Procedure: LEFT BREAST LATIISSIMUS FLAP;  Surgeon: Crissie Reese, MD;  Location: [facility];  Service: Plastics;  Laterality: Left;  [name] MASS EXCISION     back  . MASTECTOMY W/ SENTINEL NODE BIOPSY Left 03/01/2019   Procedure: LEFT MASTECTOMY WITH LEFT SENTINEL LYMPH NODE BIOPSY;  Surgeon: Alphonsa Overall, MD;  Location: [facility];  Service: General;  Laterality: Left;  . PLACEMENT OF BREAST IMPLANTS Left 03/01/2019   Procedure: PLACEMENT OF SALINE BREAST IMPLANT;  Surgeon: Crissie Reese, MD;  Location: [facility];  Service: Plastics;  Laterality: Left;  . PORTACATH PLACEMENT  02/11/11  . PORTACATH  REMOVAL  MARCH 2014  . RESECTOSCOPIC POLYPECTOMY  2001   MYOMECTOMY  . SOFT TISSUE CYST EXCISION     back cyst  . VAGINAL MASS EXCISION      There were no vitals filed for this visit.  Subjective Assessment - 08/08/19 0904    Subjective  Pt reports that she is feeling better. She has much less pain/tightness in her L lateral trunk area. She states she has been doing her exercise at home and had a little soreness but it just felt like muscle soreness.    Pertinent History  Pt with left mastectomy with SLNB due to on 03/01/19 by Dr. Lucia Gaskins with latissimus flap and placement of saline breast implant by Dr. Harlow Mares from [facility].  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  Total of 9 lymph nodes taken out. PMH excellent.    Limitations  Lifting    Patient Stated Goals  get back to doing hair randomly    Currently in Pain?  Yes    Pain Score  1     Pain Location  Back    Pain Orientation  Left    Pain Descriptors / Indicators  Tightness  Pain Type  Surgical pain    Pain Onset  More than a month ago                       [facility] Adult PT Treatment/Exercise - 08/08/19 0001      Exercises   Exercises  Other Exercises    Other Exercises   Strength ABC program with modifications w/o weights and unilateral HHA intermittently for step up on stair, modified figure 4 with strap instead of butterfly stretch, and pelvic tilt w/marching instead of crunch. occasional tactile cueing required, demonstration and VC for correct movement.       Manual Therapy   Manual Therapy  Myofascial release    Myofascial Release  Myofascial relelase along the L lateral/posterior trunk wall around incision area and along the inferior/lateral portion of the L breast slowly with extra time spent on stretch, in multiple directions.              PT Education - 08/08/19 0931    Education Details  Pt will work on  [employer] program, continue to wear compresson and perform MLD as needed. She will return to physical therapy if needed after she figures out what is going on with her insurance; due to pt is concerned that her insurance is not covering physical therapy.    Person(s) Educated  Patient    Methods  Explanation;Handout;Verbal cues;Demonstration    Comprehension  Verbalized understanding;Returned demonstration          PT Long Term Goals - 08/08/19 0910      PT LONG TERM GOAL #1   Title  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 without limitations    Baseline  Pt states that she is still taking more breaks that she use to when doing hair but just takes quick breaks to stretch and then continues with working on hair.    Time  4    Period  Weeks    Status  Partially Met    Target Date  10/03/19      PT LONG TERM GOAL #4   Title  Pt will obtain appropriate post surgical garments as needed    Baseline  Pt is wearing the compression camisole    Status  Achieved            Plan - 08/08/19 0904    Clinical Impression Statement  Pt is progressing with all of her goals. She has met achieved all goals except continues to require to take rest breaks while doing hair due to tightness in her L upper back area. She reports this has gotten much better and she is able to manage it during her sessions to continue. Pt reports she is feeling much better and wants to take a break from physical therapy at this time due to she is concerned her insurance is not covering her therapy services. She has been wearing her compression camisole on a daily basis and is performing her exercises. She was educated on the strength ABC program today. Myofascial release was performed to the reconstructed incision area and over the L latissimus dorsi; minimal improvement following treatment session with significant improvement from her last session. Pt will benefit from 1x/week for 4 more weeks pending insurance in order  to improve stiffness in the L posteriorupper quadrant to improve abiltiy to perform work without requiring rest breaks.    Personal Factors and Comorbidities  Age;Comorbidity 2    Comorbidities  history of radiation and lymph node removal    Examination-Activity Limitations  Sleep;Bend;Reach Overhead    Examination-Participation Restrictions  Yard Work;Cleaning;Shop;Meal Prep    Rehab Potential  Excellent    PT Frequency  1x / week    PT Duration  4 weeks    PT Treatment/Interventions  ADLs/Self Care Home Management;Moist Heat;Manual lymph drainage;Scar mobilization;Taping;Therapeutic exercise;Patient/family education;Manual techniques;Passive range of motion    PT Next Visit Plan  ask about strength ABC, continue myofascial release/mobility, continue with MLD and progress postural/core strengthening, Pt will be on hold until she returns due to insurance.    PT Home Exercise Plan  Access Code: [email or web address] , work on pelvic positioning with hair, Access Code: [email or web address]    Consulted and Agree with Plan of Care  Patient       Patient will benefit from skilled therapeutic intervention in order to improve the following deficits and impairments:  Decreased skin integrity, Increased muscle spasms, Decreased range of motion, Decreased scar mobility, Pain, Postural dysfunction, Impaired UE functional use  Visit Diagnosis: Aftercare following surgery for neoplasm  Stiffness of left shoulder, not elsewhere classified  Abnormal posture  H/O left mastectomy     Problem List Patient Active Problem List   Diagnosis Date Noted  . Genetic testing 07/20/2018  . Family history of breast cancer 06/23/2018  . Family history of prostate cancer 06/23/2018  . Trochanteric bursitis, right hip 06/23/2017  . Unilateral primary osteoarthritis, right knee 06/23/2017  . Recurrent breast cancer, left ([place]) 04/22/2017  . 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 Malignant neoplasm of overlapping  sites of left breast in female, estrogen receptor positive ([place]) 02/09/2016  . History of breast cancer, left, T1, N0, Her2Neu postive. 12/31/2010  . Hx of laparoscopic gastric banding 12/07/2010  . Morbidly obese ([place]) 11/04/2010  . Hypertension 11/04/2010  . Wears glasses 11/04/2010  . Gum disease 11/04/2010  . Breast lump 11/04/2010    Ander Purpura, PT 08/08/2019, 10:05 AM  [place], [place], [address] Phone: [phone]   Fax:  [phone]  Name: BRENLY TRAWICK MRN: [id number] Date of Birth: [birth year]

## 2019-08-15 ENCOUNTER — Other Ambulatory Visit: Payer: Self-pay

## 2019-08-15 DIAGNOSIS — Z170 Estrogen receptor positive status [ER+]: Secondary | ICD-10-CM

## 2019-08-15 DIAGNOSIS — C50812 Malignant neoplasm of overlapping sites of left female breast: Secondary | ICD-10-CM

## 2019-08-15 NOTE — Progress Notes (Signed)
[place]  Telephone:(336) [phone] Fax:(336) [phone]   ID: [name]   DOB: 1956/02/12  MR#: [id number]  DJM#:[id number]  Patient Care Team: Marton Redwood, MD as PCP - General (Internal Medicine) Keghan Mcfarren, Virgie Dad, MD as Attending Physician (Hematology and Oncology) Arloa Koh, MD (Inactive) as Attending Physician (Radiation Oncology) Alphonsa Overall, MD as Consulting Physician (General Surgery) Crissie Reese, MD as Consulting Physician (Plastic Surgery)  OTHER MD: [name], MD (GYN)   CHIEF COMPLAINT:  Left Breast Cancer (s/p left mastectomy)  CURRENT TREATMENT: To start fulvestrant   INTERVAL HISTORY: [name] returns today for follow-up of her recurrent left breast cancer.   She was started on exemestane at her last visit.  She took it briefly but discontinued it because she developed some leg pain.  [name]'s last bone density screening on 04/22/2016 at [facility], showed a T-score of -1.0, which is considered normal. She is overdue for a bone density screening.   REVIEW OF SYSTEMS: [name] is doing some chair yoga and chair tai chi.  She had Covid infection in January 2021 and went through it okay.  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  She is still receiving physical therapy for her left breast reconstruction.  Generally she is quite satisfied with it.  A detailed review of systems today was otherwise noncontributory   HISTORY OF PRESENT ILLNESS: [name] was referred by [name] for treatment of left breast cancer.   The patient underwent screening bilateral mammography July 04, 2009, at [facility] and this showed only a scattered fibroglandular densities. However, repeat screening exam Sep 29, 2010 showed a potential abnormality in the left breast, retroareolar region. The patient was recalled for additional views Oct 08, 2010 and Dr. [name] was able to locate the nodular density noted on the screening exam. It measured approximately 8  mm. Ultrasound showed a second nodular density just lateral to the nipple areolar complex. In that area, there was what appears to be either a small cluster of cysts or perhaps a solid nodule. In particular, the nodule seen in the 3 o'clock position was felt to be most likely a fibroadenoma. The other area, however, required biopsy and this was performed October 26, 2010. The pathology from this procedure ([id number]) showed a high-grade invasive ductal carcinoma which was estrogen-receptor positive at 98%, progesterone-receptor positive at 13% with an elevated MIB-1 at 69% and no evidence of HER-2 amplification, the ratio by CISH being 1.32.   With this information the patient was referred for bilateral breast MRIs and these were performed October 30, 2010. In the left breast there was a 1.8 cm ill-defined area in the central breast associated with post biopsy changes. There were really no other suspicious areas in either breast and no bony abnormalities and no abnormal appearing or enlarged lymph nodes.   Patient underwent left lumpectomy and sentinel lymph node sampling December 10, 2010 ([id number]) for a 1.1-cm invasive ductal carcinoma, grade 3, with 0/2 sentinel lymph nodes involved and so T1c N0 (stage I); the tumor being estrogen receptor 98% and progesterone receptor 13% positive, with no HER-2 amplification (by initial determination, but amplified on repeat with a ratio of 2.30); with an Oncotype DX score of 43 predicting a 29% risk of recurrence if she takes 5 years of tamoxifen. (Her-2 was negative on the Oncotype DX determination, which uses a completely different method).   Her subsequent history is as detailed below.   PAST MEDICAL HISTORY: Past Medical History:  Diagnosis  Date  . 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 Breast lump    left  . Cancer ([place])   . Family history of breast cancer 06/23/2018  . Family history of prostate cancer 06/23/2018  . Gum  disease   . Hyperlipidemia    diet and exercise controlled, no med  . Hypertension   . Pre-diabetes    borderline - diet and exercise contolled, no meds  . Sleep apnea    uses CPAP nightly    PAST SURGICAL HISTORY: Past Surgical History:  Procedure Laterality Date  . BREAST LUMPECTOMY  2012   left  . COLONOSCOPY    . FOOT MASS EXCISION    . LAPAROSCOPIC GASTRIC BANDING    . LATISSIMUS FLAP TO BREAST Left 03/01/2019   Procedure: LEFT BREAST LATIISSIMUS FLAP;  Surgeon: Crissie Reese, MD;  Location: [facility];  Service: Plastics;  Laterality: Left;  [name] MASS EXCISION     back  . MASTECTOMY W/ SENTINEL NODE BIOPSY Left 03/01/2019   Procedure: LEFT MASTECTOMY WITH LEFT SENTINEL LYMPH NODE BIOPSY;  Surgeon: Alphonsa Overall, MD;  Location: [facility];  Service: General;  Laterality: Left;  . PLACEMENT OF BREAST IMPLANTS Left 03/01/2019   Procedure: PLACEMENT OF SALINE BREAST IMPLANT;  Surgeon: Crissie Reese, MD;  Location: [facility];  Service: Plastics;  Laterality: Left;  . PORTACATH PLACEMENT  02/11/11  . PORTACATH REMOVAL  MARCH 2014  . RESECTOSCOPIC POLYPECTOMY  2001   MYOMECTOMY  . SOFT TISSUE CYST EXCISION     back cyst  . VAGINAL MASS EXCISION      FAMILY HISTORY Family History  Problem Relation Age of Onset  . Cancer Maternal Uncle 31       prostate, metastatic  . Alcohol abuse Father   . Cirrhosis Father   . Breast cancer Mother 59    GYNECOLOGIC HISTORY: ((Updated 01/26/2013) She had menarche at age 37. She is GX P0.  Last menstrual period was late May 2012.  She has never used hormone replacement.     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  She is divorced and lives by herself. She attends a [employer]. She used to go to the gym at least three times a week.    ADVANCED DIRECTIVES: Not in place   HEALTH MAINTENANCE: Social History   Tobacco Use  . Smoking status: Former Smoker    Packs/day: 1.00    Years:  21.00    Pack years: 21.00    Types: Cigarettes    Quit date: 03/25/1999    Years since quitting: 20.4  . Smokeless tobacco: Never Used  Substance Use Topics  . Alcohol use: Yes    Alcohol/week: 7.0 standard drinks    Types: 7 Glasses of wine per week    Comment: red wine  . Drug use: No     Colonoscopy:   PAP:   Bone density: March 2011, Normal  Lipid panel:    No Known Allergies  Current Outpatient Medications  Medication Sig Dispense Refill  . amLODipine-olmesartan (AZOR) 10-40 MG per tablet Take 1 tablet by mouth daily. (Patient taking differently: Take 1 tablet by mouth at bedtime. ) 30 tablet 6  . triamterene-hydrochlorothiazide (DYAZIDE) 37.5-25 MG capsule Take 1 each (1 capsule total) by mouth daily.     No current facility-administered medications for this visit.   OBJECTIVE:African-American woman in no acute distress Vitals:  08/16/19 1036  BP: (!) 159/86  Pulse: 67  Resp: 18  Temp: 98.2 F (36.8 C)  SpO2: 100%     Body mass index is 44.72 kg/m.    ECOG FS: 1 There were no vitals filed for this visit.  Sclerae unicteric, EOMs intact Wearing a mask No cervical or supraclavicular adenopathy Lungs no rales or rhonchi Heart regular rate and rhythm Abd soft, nontender, positive bowel sounds MSK no focal spinal tenderness, no upper extremity lymphedema Neuro: nonfocal, well oriented, appropriate affect Breasts: The right breast is unremarkable.  The left breast is status post mastectomy with latissimus flap reconstruction.  There is some irregularity in the medial aspect of the incision which Dr. Harlow Mares has offered to repair but she really does not want any further surgery.  Both axillae are benign.   LAB RESULTS: Lab Results  Component Value Date   WBC 3.9 (L) 08/16/2019   NEUTROABS 2.1 08/16/2019   HGB 12.6 08/16/2019   HCT 40.2 08/16/2019   MCV 82.7 08/16/2019   PLT 297 08/16/2019      Chemistry      Component Value Date/Time   NA 140  08/16/2019 1008   NA 140 04/22/2017 1327   K 3.8 08/16/2019 1008   K 4.0 04/22/2017 1327   CL 105 08/16/2019 1008   CL 105 09/08/2012 1320   CO2 26 08/16/2019 1008   CO2 25 04/22/2017 1327   BUN 13 08/16/2019 1008   BUN 15.0 04/22/2017 1327   CREATININE 0.83 08/16/2019 1008   CREATININE 0.8 04/22/2017 1327      Component Value Date/Time   CALCIUM 9.2 08/16/2019 1008   CALCIUM 9.0 04/22/2017 1327   ALKPHOS 66 08/16/2019 1008   ALKPHOS 58 04/22/2017 1327   AST 13 (L) 08/16/2019 1008   AST 13 04/22/2017 1327   ALT 14 08/16/2019 1008   ALT 11 04/22/2017 1327   BILITOT 0.5 08/16/2019 1008   BILITOT 0.38 04/22/2017 1327      STUDIES: No results found.    ASSESSMENT: 64 y.o. [place] woman   1. Status post left lumpectomy and sentinel lymph node sampling August 2012 for a T1cN0, stage IA invasive ductal carcinoma of overlapping sites, grade 3, estrogen receptor 98% and progesterone receptor 13% positive, HER-2/neu amplified with a ratio of 2.3.   2. Oncotype showed a recurrence score of 43 ( "high risk"), predicting a 29% risk of recurrence if the patient's only adjuvant treatment was tamoxifen   3. received adjuvant docetaxel/ cyclophosphamide x4, completed December 2012   4. trastuzumab started 04/15/2011, completed 05/05/2012; final echo 02/18/2012  5. radiation therapy, completed July 23, 2011  6. began on tamoxifen March 2013, but never taken consistently   RECURRENT DISEASE: November 2018 7.  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  Anastrozole started 04/22/2017, discontinued January 2020, poorly tolerated  (a) mamm[facility] 03/20/2018 shows evidence of growth  (b) exemestane started January 2020, continued with significant interruptions  9.  Status post left  mastectomy with sentinel lymph node sampling 03/01/2019 for a pT2 pN0, stage IB  invasive ductal carcinoma, grade 2, with negative margins.  The invasive tumor was estrogen and progesterone receptor positive, with an MIB-1 of 30%.  HER-2 was not amplified.  (a) immediate left latissimus reconstruction with saline implant placement  10.  Genetics testing 07/17/2018  through the Common Hereditary Cancers Panel offered by [facility] found no deleterious mutations in APC, ATM, AXIN2, BARD1, BMPR1A, BRCA1, BRCA2, BRIP1, CDH1, CDKN2A (p14ARF), CDKN2A (p16INK4a), CKD4, CHEK2, CTNNA1, DICER1, EPCAM (Deletion/duplication testing only), GREM1 (promoter region deletion/duplication testing only), KIT, MEN1, MLH1, MSH2, MSH3, MSH6, MUTYH, NBN, NF1, NHTL1, PALB2, PDGFRA, PMS2, POLD1, POLE, PTEN, RAD50, RAD51C, RAD51D, SDHB, SDHC, SDHD, SMAD4, SMARCA4. STK11, TP53, TSC1, TSC2, and VHL.  The following genes were evaluated for sequence changes only: SDHA and HOXB13 c.251G>A variant only   PLAN: [name] stop the exemestane because she had some leg pains.  They may have been related or they may have been unrelated but she really does not want to go back to that medication.  She understands she really does need antiestrogens for a total of 5 years at a minimum if she wants to optimize her chance of breast cancer not recurring.  We discussed anastrozole versus fulvestrant.  Most of my patients really have no problems from fulvestrant aside from the actual administration.  She likes the idea of an injection every month.  She cannot do it this month she says because of rehab so we are going to start 09/19/2019.  I have set her up for right mammography and a bone density to be done this month.  She will see me again in about 6 months.  She knows to call for any other issue that may develop before then.  Total encounter time 35 minutes.*  [name], Virgie Dad, MD  08/16/19 11:06 AM Medical Oncology and Hematology [facility] [place], [place] [address] Tel. [phone]    Fax. [phone]   I, Wilburn Mylar, am acting as scribe for Dr. Virgie Dad. [name].  I, Lurline Del MD, have reviewed the above documentation for accuracy and completeness, and I agree with the above.   *Total Encounter Time as defined by the Centers for Medicare and Medicaid Services includes, in addition to the face-to-face time of a patient visit (documented in the note above) non-face-to-face time: obtaining and reviewing outside history, ordering and reviewing medications, tests or procedures, care coordination (communications with other health care professionals or caregivers) and documentation in the medical record.

## 2019-08-16 ENCOUNTER — Inpatient Hospital Stay: Payer: BC Managed Care – PPO | Attending: Oncology | Admitting: Oncology

## 2019-08-16 ENCOUNTER — Inpatient Hospital Stay: Payer: BC Managed Care – PPO

## 2019-08-16 ENCOUNTER — Other Ambulatory Visit: Payer: Self-pay

## 2019-08-16 VITALS — BP 159/86 | HR 67 | Temp 98.2°F | Resp 18 | Ht 60.0 in

## 2019-08-16 DIAGNOSIS — Z170 Estrogen receptor positive status [ER+]: Secondary | ICD-10-CM | POA: Diagnosis not present

## 2019-08-16 DIAGNOSIS — C50912 Malignant neoplasm of unspecified site of left female breast: Secondary | ICD-10-CM | POA: Diagnosis not present

## 2019-08-16 DIAGNOSIS — C50919 Malignant neoplasm of unspecified site of unspecified female breast: Secondary | ICD-10-CM

## 2019-08-16 DIAGNOSIS — D0512 Intraductal carcinoma in situ of left breast: Secondary | ICD-10-CM | POA: Diagnosis not present

## 2019-08-16 DIAGNOSIS — C50112 Malignant neoplasm of central portion of left female breast: Secondary | ICD-10-CM | POA: Insufficient documentation

## 2019-08-16 DIAGNOSIS — C50812 Malignant neoplasm of overlapping sites of left female breast: Secondary | ICD-10-CM

## 2019-08-16 DIAGNOSIS — I10 Essential (primary) hypertension: Secondary | ICD-10-CM

## 2019-08-16 DIAGNOSIS — Z1379 Encounter for other screening for genetic and chromosomal anomalies: Secondary | ICD-10-CM

## 2019-08-16 LAB — CMP (CANCER CENTER ONLY)
ALT: 14 U/L (ref 0–44)
AST: 13 U/L — ABNORMAL LOW (ref 15–41)
Albumin: 3.9 g/dL (ref 3.5–5.0)
Alkaline Phosphatase: 66 U/L (ref 38–126)
Anion gap: 9 (ref 5–15)
BUN: 13 mg/dL (ref 8–23)
CO2: 26 mmol/L (ref 22–32)
Calcium: 9.2 mg/dL (ref 8.9–10.3)
Chloride: 105 mmol/L (ref 98–111)
Creatinine: 0.83 mg/dL (ref 0.44–1.00)
GFR, Est AFR Am: >60 mL/min (ref >60)
GFR, Estimated: >60 mL/min (ref >60)
Glucose, Bld: 93 mg/dL (ref 70–99)
Potassium: 3.8 mmol/L (ref 3.5–5.1)
Sodium: 140 mmol/L (ref 135–145)
Total Bilirubin: 0.5 mg/dL (ref 0.3–1.2)
Total Protein: 7.8 g/dL (ref 6.5–8.1)

## 2019-08-16 LAB — CBC WITH DIFFERENTIAL (CANCER CENTER ONLY)
Abs Immature Granulocytes: 0.01 10*3/uL (ref 0.00–0.07)
Basophils Absolute: 0 10*3/uL (ref 0.0–0.1)
Basophils Relative: 0 %
Eosinophils Absolute: 0 10*3/uL (ref 0.0–0.5)
Eosinophils Relative: 1 %
HCT: 40.2 % (ref 36.0–46.0)
Hemoglobin: 12.6 g/dL (ref 12.0–15.0)
Immature Granulocytes: 0 %
Lymphocytes Relative: 37 %
Lymphs Abs: 1.5 10*3/uL (ref 0.7–4.0)
MCH: 25.9 pg — ABNORMAL LOW (ref 26.0–34.0)
MCHC: 31.3 g/dL (ref 30.0–36.0)
MCV: 82.7 fL (ref 80.0–100.0)
Monocytes Absolute: 0.3 10*3/uL (ref 0.1–1.0)
Monocytes Relative: 8 %
Neutro Abs: 2.1 10*3/uL (ref 1.7–7.7)
Neutrophils Relative %: 54 %
Platelet Count: 297 10*3/uL (ref 150–400)
RBC: 4.86 MIL/uL (ref 3.87–5.11)
RDW: 15.3 % (ref 11.5–15.5)
WBC Count: 3.9 10*3/uL — ABNORMAL LOW (ref 4.0–10.5)
nRBC: 0 % (ref 0.0–0.2)

## 2019-08-17 ENCOUNTER — Telehealth: Payer: Self-pay | Admitting: Oncology

## 2019-08-17 NOTE — Telephone Encounter (Signed)
Scheduled appt per 4/8 los. Pt confirmed next appt date and time.

## 2019-08-27 ENCOUNTER — Ambulatory Visit: Payer: BC Managed Care – PPO | Admitting: Obstetrics & Gynecology

## 2019-08-27 ENCOUNTER — Other Ambulatory Visit: Payer: Self-pay

## 2019-08-27 ENCOUNTER — Other Ambulatory Visit (HOSPITAL_COMMUNITY)
Admission: RE | Admit: 2019-08-27 | Discharge: 2019-08-27 | Disposition: A | Discharge status: Home / Self Care | Payer: BC Managed Care – PPO | Source: Ambulatory Visit | Attending: Obstetrics & Gynecology | Admitting: Obstetrics & Gynecology

## 2019-08-27 ENCOUNTER — Encounter: Payer: Self-pay | Admitting: Obstetrics & Gynecology

## 2019-08-27 VITALS — BP 130/76 | HR 70 | Temp 97.3°F | Resp 16 | Ht 60.0 in | Wt 226.0 lb

## 2019-08-27 DIAGNOSIS — Z124 Encounter for screening for malignant neoplasm of cervix: Secondary | ICD-10-CM | POA: Diagnosis not present

## 2019-08-27 DIAGNOSIS — R3915 Urgency of urination: Secondary | ICD-10-CM

## 2019-08-27 DIAGNOSIS — Z01419 Encounter for gynecological examination (general) (routine) without abnormal findings: Secondary | ICD-10-CM

## 2019-08-27 NOTE — Progress Notes (Signed)
64 y.o. G1P0010 Divorced [place] American female here for establishing care.  She has hx of breast cancer diagnosed initially in 2012.  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  Treated with lumpectomy, SNB, chemo and radiation which she completed in March, 2013.  Treated with Trastuzumab for one year.  Started Tamoxifen in 2013 but never took consistently.    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  Anastrozole started 12/18 and stopped 05/2018 due to tolerance issues.  MMG 03/2018 showed growth.  Exemestane started 05/2018 with significant interruptions.  Then left mastectomy with SNB 02/2019 was performed with latissimus dorsi flap with saline implant placed.  Adjuvant therapy has been recommended but she is not sure she wants to be on anything at this time.  Feels she has side effects with everything.    Did have negative genetic testing 07/17/2018.    Has hx of ovarian cyst when she was seeing Dr. Toney Rakes.  Last PUS 09/29/2016 showing multiple fibroids.  Largest measured 6.2cm.  Ovaries were normal at that time.  Denies vaginal bleeding.  Has some urinary urgency that may be due to vaginal estrogen changes but desires testing for infection.  Patient's last menstrual period was 10/06/2010 (lmp unknown).          Sexually active: Yes.    The current method of family planning is post menopausal status.    Exercising: Yes.    chair yoga for seniors and Tai Chi Smoker:  no  Health Maintenance: Pap:  02-06-16 Neg, 09-25-12 Neg:Neg HR HPV History of abnormal Pap:  no MMG: 03-20-18 3D/Hx of Lt.Br.CA--see Epic--Dr.Magrinat has ordrered Colonoscopy: 06-01-18 diverticulosis;next 10 years BMD: 04-16-16  TDaP: PCP Pneumonia vaccine(s):  no Shingrix:   no Hep C testing: no Screening Labs: [name]   reports that she quit smoking about 20 years ago. Her smoking use included cigarettes. She has a 21.00 pack-year smoking history.  She has never used smokeless tobacco. She reports current alcohol use of about 7.0 standard drinks of alcohol per week. She reports that she does not use drugs.  Past Medical History:  Diagnosis Date  . BRCA negative   . Breast cancer ([place]) 2018   stage I high grade invasive ductal ca left breast  . Breast lump    left  . Cancer ([place]) 2012   lumpectomy--Lt.Br.  . Family history of breast cancer 06/23/2018  . Family history of prostate cancer 06/23/2018  . Gum disease   . Hyperlipidemia    diet and exercise controlled, no med  . Hypertension   . Pre-diabetes    borderline - diet and exercise contolled, no meds  . Sleep apnea    uses CPAP nightly  . Urinary incontinence     Past Surgical History:  Procedure Laterality Date  . BREAST LUMPECTOMY  2012   left  . COLONOSCOPY    . FOOT MASS EXCISION    . LAPAROSCOPIC GASTRIC BANDING    . LATISSIMUS FLAP TO BREAST Left 03/01/2019   Procedure: LEFT BREAST LATIISSIMUS FLAP;  Surgeon: Crissie Reese, MD;  Location: [facility];  Service: Plastics;  Laterality: Left;  [name] MASS EXCISION     back  . MASTECTOMY W/ SENTINEL NODE BIOPSY Left 03/01/2019   Procedure: LEFT MASTECTOMY WITH LEFT SENTINEL LYMPH NODE BIOPSY;  Surgeon: Alphonsa Overall, MD;  Location: [facility];  Service: General;  Laterality: Left;  . PLACEMENT OF BREAST IMPLANTS Left 03/01/2019  Procedure: PLACEMENT OF SALINE BREAST IMPLANT;  Surgeon: Crissie Reese, MD;  Location: [facility];  Service: Plastics;  Laterality: Left;  . PORTACATH PLACEMENT  02/11/11  . PORTACATH REMOVAL  MARCH 2014  . RESECTOSCOPIC POLYPECTOMY  2001   MYOMECTOMY  . SOFT TISSUE CYST EXCISION     back cyst  . VAGINAL MASS EXCISION      Current Outpatient Medications  Medication Sig Dispense Refill  . amLODipine-olmesartan (AZOR) 10-40 MG per tablet Take 1 tablet by mouth daily. (Patient taking differently: Take 1 tablet by mouth at bedtime. ) 30 tablet 6  . calcium carbonate (OS-CAL) 1250 (500 Ca) MG chewable tablet  Chew by mouth.    . cyanocobalamin 1000 MCG tablet Take 1 tablet by mouth as needed.    [name] ELDERBERRY PO Take 1 tablet by mouth as needed.    . Multiple Vitamin (MULTIVITAMIN) capsule Take 1 capsule by mouth daily.    [name] triamterene-hydrochlorothiazide (DYAZIDE) 37.5-25 MG capsule Take 1 each (1 capsule total) by mouth daily.     No current facility-administered medications for this visit.    Family History  Problem Relation Age of Onset  . Cancer Maternal Uncle 30       prostate, metastatic  . Alcohol abuse Father   . Cirrhosis Father   . Breast cancer Mother 58       Stage 1  . Hypertension Mother     Review of Systems  Genitourinary:       Urinary incontinence and urgency  All other systems reviewed and are negative.   Exam:   BP 130/76 (Cuff Size: Large)   Pulse 70   Temp (!) 97.3 F (36.3 C) (Temporal)   Resp 16   Ht 5' (1.524 m)   Wt 226 lb (102.5 kg)   LMP 10/06/2010 (LMP Unknown)   BMI 44.14 kg/m    Height: 5' (152.4 cm)  Ht Readings from Last 3 Encounters:  08/27/19 5' (1.524 m)  08/16/19 5' (1.524 m)  04/30/19 5' (1.524 m)    General appearance: alert, cooperative and appears stated age Head: Normocephalic, without obvious abnormality, atraumatic Neck: no adenopathy, supple, symmetrical, trachea midline and thyroid normal to inspection and palpation Lungs: clear to auscultation bilaterally Heart: regular rate and rhythm Breast:  Declined exam today.  Stated just done by Dr. Jana Hakim. Abdomen: soft, non-tender; bowel sounds normal; no masses,  no organomegaly Extremities: extremities normal, atraumatic, no cyanosis or edema Skin: Skin color, texture, turgor normal. No rashes or lesions Lymph nodes: Cervical, supraclavicular, and axillary nodes normal. No abnormal inguinal nodes palpated Neurologic: Grossly normal   Pelvic: External genitalia:  no lesions              Urethra:  normal appearing urethra with no masses, tenderness or lesions               Bartholins and Skenes: normal                 Vagina: normal appearing vagina with normal color and discharge, no lesions              Cervix: no lesions              Pap taken: Yes.   Bimanual Exam:  Uterus:  enlarged, 8-10 weeks size, globular and c/w fibroid uterus              Adnexa: normal adnexa and no mass, fullness, tenderness  Rectovaginal: Confirms               Anus:  normal sphincter tone, no lesions  Chaperone, Amanda Dixon, CMA, was present for exam.  A:  Well Woman with normal exam PMP, no HRT H/o breast cancer in 2013 with recurrence in 2018 with multiple interruptions in care, currently antiestrogen therapy has been recommended but pt has not decided to proceed with any further treatment Hypertension Side effects with anti-estrogen  P:   Mammogram on right breast yearly BMD is scheduled with upcoming MMG pap smear with HR HPV obtained today Lab work and vaccines done with Dr. Shaw Colonoscopy is UTD Urine culture pending Return annually or prn   

## 2019-08-29 LAB — CYTOLOGY - PAP
Comment: NEGATIVE
Diagnosis: NEGATIVE
High risk HPV: NEGATIVE

## 2019-08-29 LAB — URINE CULTURE: Organism ID, Bacteria: NO GROWTH

## 2019-09-19 ENCOUNTER — Telehealth: Payer: Self-pay | Admitting: Oncology

## 2019-09-19 ENCOUNTER — Inpatient Hospital Stay: Payer: BC Managed Care – PPO

## 2019-09-19 NOTE — Telephone Encounter (Signed)
Called pt per 5/11 sch message - no answer and no vmail . Cancelled appt

## 2019-09-27 ENCOUNTER — Encounter: Payer: Self-pay | Admitting: Oncology

## 2019-10-12 ENCOUNTER — Other Ambulatory Visit: Payer: Self-pay | Admitting: Oncology

## 2019-10-17 ENCOUNTER — Inpatient Hospital Stay: Payer: BC Managed Care – PPO | Attending: Oncology

## 2019-11-14 ENCOUNTER — Inpatient Hospital Stay: Payer: BC Managed Care – PPO

## 2019-12-12 ENCOUNTER — Inpatient Hospital Stay: Payer: BC Managed Care – PPO | Attending: Oncology

## 2019-12-12 ENCOUNTER — Other Ambulatory Visit: Payer: Self-pay

## 2019-12-12 VITALS — BP 153/84 | HR 69 | Temp 98.8°F | Resp 18

## 2019-12-12 DIAGNOSIS — Z5111 Encounter for antineoplastic chemotherapy: Secondary | ICD-10-CM | POA: Diagnosis present

## 2019-12-12 DIAGNOSIS — C50812 Malignant neoplasm of overlapping sites of left female breast: Secondary | ICD-10-CM

## 2019-12-12 DIAGNOSIS — Z79810 Long term (current) use of selective estrogen receptor modulators (SERMs): Secondary | ICD-10-CM | POA: Diagnosis not present

## 2019-12-12 DIAGNOSIS — Z923 Personal history of irradiation: Secondary | ICD-10-CM | POA: Diagnosis not present

## 2019-12-12 DIAGNOSIS — C50112 Malignant neoplasm of central portion of left female breast: Secondary | ICD-10-CM | POA: Insufficient documentation

## 2019-12-12 DIAGNOSIS — Z9012 Acquired absence of left breast and nipple: Secondary | ICD-10-CM | POA: Diagnosis not present

## 2019-12-12 DIAGNOSIS — Z170 Estrogen receptor positive status [ER+]: Secondary | ICD-10-CM

## 2019-12-12 MED ORDER — FULVESTRANT 250 MG/5ML IM SOLN
INTRAMUSCULAR | Status: AC
  Filled 2019-12-12: qty 5

## 2019-12-12 MED ORDER — FULVESTRANT 250 MG/5ML IM SOLN
500.0000 mg | Freq: Once | INTRAMUSCULAR | Status: AC
  Administered 2019-12-12: 500 mg via INTRAMUSCULAR

## 2019-12-12 NOTE — Patient Instructions (Signed)
Fulvestrant injection What is this medicine? FULVESTRANT (ful VES trant) blocks the effects of estrogen. It is used to treat breast cancer. This medicine may be used for other purposes; ask your health care provider or pharmacist if you have questions. COMMON BRAND NAME(S): FASLODEX What should I tell my health care provider before I take this medicine? They need to know if you have any of these conditions:  bleeding disorders  liver disease  low blood counts, like low white cell, platelet, or red cell counts  an unusual or allergic reaction to fulvestrant, other medicines, foods, dyes, or preservatives  pregnant or trying to get pregnant  breast-feeding How should I use this medicine? This medicine is for injection into a muscle. It is usually given by a health care professional in a hospital or clinic setting. Talk to your pediatrician regarding the use of this medicine in children. Special care may be needed. Overdosage: If you think you have taken too much of this medicine contact a poison control center or emergency room at once. NOTE: This medicine is only for you. Do not share this medicine with others. What if I miss a dose? It is important not to miss your dose. Call your doctor or health care professional if you are unable to keep an appointment. What may interact with this medicine?  medicines that treat or prevent blood clots like warfarin, enoxaparin, dalteparin, apixaban, dabigatran, and rivaroxaban This list may not describe all possible interactions. Give your health care provider a list of all the medicines, herbs, non-prescription drugs, or dietary supplements you use. Also tell them if you smoke, drink alcohol, or use illegal drugs. Some items may interact with your medicine. What should I watch for while using this medicine? Your condition will be monitored carefully while you are receiving this medicine. You will need important blood work done while you are taking  this medicine. Do not become pregnant while taking this medicine or for at least 1 year after stopping it. Women of child-bearing potential will need to have a negative pregnancy test before starting this medicine. Women should inform their doctor if they wish to become pregnant or think they might be pregnant. There is a potential for serious side effects to an unborn child. Men should inform their doctors if they wish to father a child. This medicine may lower sperm counts. Talk to your health care professional or pharmacist for more information. Do not breast-feed an infant while taking this medicine or for 1 year after the last dose. What side effects may I notice from receiving this medicine? Side effects that you should report to your doctor or health care professional as soon as possible:  allergic reactions like skin rash, itching or hives, swelling of the face, lips, or tongue  feeling faint or lightheaded, falls  pain, tingling, numbness, or weakness in the legs  signs and symptoms of infection like fever or chills; cough; flu-like symptoms; sore throat  vaginal bleeding Side effects that usually do not require medical attention (report to your doctor or health care professional if they continue or are bothersome):  aches, pains  constipation  diarrhea  headache  hot flashes  nausea, vomiting  pain at site where injected  stomach pain This list may not describe all possible side effects. Call your doctor for medical advice about side effects. You may report side effects to FDA at 1-800-FDA-1088. Where should I keep my medicine? This drug is given in a hospital or clinic and will   not be stored at home. NOTE: This sheet is a summary. It may not cover all possible information. If you have questions about this medicine, talk to your doctor, pharmacist, or health care provider.  2020 Elsevier/Gold Standard (2017-08-04 11:34:41)  

## 2020-01-09 ENCOUNTER — Other Ambulatory Visit: Payer: Self-pay

## 2020-01-09 ENCOUNTER — Inpatient Hospital Stay: Payer: BC Managed Care – PPO | Attending: Oncology

## 2020-01-09 VITALS — BP 151/81 | HR 65 | Temp 98.9°F | Resp 18

## 2020-01-09 DIAGNOSIS — Z9012 Acquired absence of left breast and nipple: Secondary | ICD-10-CM | POA: Diagnosis not present

## 2020-01-09 DIAGNOSIS — Z79811 Long term (current) use of aromatase inhibitors: Secondary | ICD-10-CM | POA: Insufficient documentation

## 2020-01-09 DIAGNOSIS — Z8042 Family history of malignant neoplasm of prostate: Secondary | ICD-10-CM | POA: Insufficient documentation

## 2020-01-09 DIAGNOSIS — I10 Essential (primary) hypertension: Secondary | ICD-10-CM | POA: Diagnosis not present

## 2020-01-09 DIAGNOSIS — Z170 Estrogen receptor positive status [ER+]: Secondary | ICD-10-CM | POA: Diagnosis not present

## 2020-01-09 DIAGNOSIS — Z5111 Encounter for antineoplastic chemotherapy: Secondary | ICD-10-CM | POA: Diagnosis present

## 2020-01-09 DIAGNOSIS — Z803 Family history of malignant neoplasm of breast: Secondary | ICD-10-CM | POA: Diagnosis not present

## 2020-01-09 DIAGNOSIS — C50112 Malignant neoplasm of central portion of left female breast: Secondary | ICD-10-CM | POA: Diagnosis present

## 2020-01-09 DIAGNOSIS — Z79899 Other long term (current) drug therapy: Secondary | ICD-10-CM | POA: Insufficient documentation

## 2020-01-09 DIAGNOSIS — Z87891 Personal history of nicotine dependence: Secondary | ICD-10-CM | POA: Diagnosis not present

## 2020-01-09 DIAGNOSIS — E785 Hyperlipidemia, unspecified: Secondary | ICD-10-CM | POA: Diagnosis not present

## 2020-01-09 DIAGNOSIS — C50812 Malignant neoplasm of overlapping sites of left female breast: Secondary | ICD-10-CM

## 2020-01-09 MED ORDER — FULVESTRANT 250 MG/5ML IM SOLN
INTRAMUSCULAR | Status: AC
  Filled 2020-01-09: qty 10

## 2020-01-09 MED ORDER — FULVESTRANT 250 MG/5ML IM SOLN
500.0000 mg | Freq: Once | INTRAMUSCULAR | Status: AC
  Administered 2020-01-09: 500 mg via INTRAMUSCULAR

## 2020-01-09 NOTE — Patient Instructions (Signed)
Fulvestrant injection What is this medicine? FULVESTRANT (ful VES trant) blocks the effects of estrogen. It is used to treat breast cancer. This medicine may be used for other purposes; ask your health care provider or pharmacist if you have questions. COMMON BRAND NAME(S): FASLODEX What should I tell my health care provider before I take this medicine? They need to know if you have any of these conditions:  bleeding disorders  liver disease  low blood counts, like low white cell, platelet, or red cell counts  an unusual or allergic reaction to fulvestrant, other medicines, foods, dyes, or preservatives  pregnant or trying to get pregnant  breast-feeding How should I use this medicine? This medicine is for injection into a muscle. It is usually given by a health care professional in a hospital or clinic setting. Talk to your pediatrician regarding the use of this medicine in children. Special care may be needed. Overdosage: If you think you have taken too much of this medicine contact a poison control center or emergency room at once. NOTE: This medicine is only for you. Do not share this medicine with others. What if I miss a dose? It is important not to miss your dose. Call your doctor or health care professional if you are unable to keep an appointment. What may interact with this medicine?  medicines that treat or prevent blood clots like warfarin, enoxaparin, dalteparin, apixaban, dabigatran, and rivaroxaban This list may not describe all possible interactions. Give your health care provider a list of all the medicines, herbs, non-prescription drugs, or dietary supplements you use. Also tell them if you smoke, drink alcohol, or use illegal drugs. Some items may interact with your medicine. What should I watch for while using this medicine? Your condition will be monitored carefully while you are receiving this medicine. You will need important blood work done while you are taking  this medicine. Do not become pregnant while taking this medicine or for at least 1 year after stopping it. Women of child-bearing potential will need to have a negative pregnancy test before starting this medicine. Women should inform their doctor if they wish to become pregnant or think they might be pregnant. There is a potential for serious side effects to an unborn child. Men should inform their doctors if they wish to father a child. This medicine may lower sperm counts. Talk to your health care professional or pharmacist for more information. Do not breast-feed an infant while taking this medicine or for 1 year after the last dose. What side effects may I notice from receiving this medicine? Side effects that you should report to your doctor or health care professional as soon as possible:  allergic reactions like skin rash, itching or hives, swelling of the face, lips, or tongue  feeling faint or lightheaded, falls  pain, tingling, numbness, or weakness in the legs  signs and symptoms of infection like fever or chills; cough; flu-like symptoms; sore throat  vaginal bleeding Side effects that usually do not require medical attention (report to your doctor or health care professional if they continue or are bothersome):  aches, pains  constipation  diarrhea  headache  hot flashes  nausea, vomiting  pain at site where injected  stomach pain This list may not describe all possible side effects. Call your doctor for medical advice about side effects. You may report side effects to FDA at 1-800-FDA-1088. Where should I keep my medicine? This drug is given in a hospital or clinic and will   not be stored at home. NOTE: This sheet is a summary. It may not cover all possible information. If you have questions about this medicine, talk to your doctor, pharmacist, or health care provider.  2020 Elsevier/Gold Standard (2017-08-04 11:34:41)  

## 2020-02-01 ENCOUNTER — Telehealth: Payer: Self-pay | Admitting: Oncology

## 2020-02-01 NOTE — Telephone Encounter (Signed)
Release: [id number] Faxed medical records to [place] @ fax#[phone]

## 2020-02-06 ENCOUNTER — Inpatient Hospital Stay: Payer: BC Managed Care – PPO

## 2020-02-13 ENCOUNTER — Other Ambulatory Visit: Payer: Self-pay

## 2020-02-13 ENCOUNTER — Telehealth: Payer: Self-pay

## 2020-02-13 ENCOUNTER — Inpatient Hospital Stay: Payer: BC Managed Care – PPO | Attending: Oncology

## 2020-02-13 VITALS — BP 155/79 | HR 64 | Temp 98.2°F | Resp 18

## 2020-02-13 DIAGNOSIS — Z7289 Other problems related to lifestyle: Secondary | ICD-10-CM | POA: Insufficient documentation

## 2020-02-13 DIAGNOSIS — Z87891 Personal history of nicotine dependence: Secondary | ICD-10-CM | POA: Diagnosis not present

## 2020-02-13 DIAGNOSIS — Z923 Personal history of irradiation: Secondary | ICD-10-CM | POA: Diagnosis not present

## 2020-02-13 DIAGNOSIS — Z811 Family history of alcohol abuse and dependence: Secondary | ICD-10-CM | POA: Insufficient documentation

## 2020-02-13 DIAGNOSIS — C50112 Malignant neoplasm of central portion of left female breast: Secondary | ICD-10-CM | POA: Diagnosis present

## 2020-02-13 DIAGNOSIS — Z170 Estrogen receptor positive status [ER+]: Secondary | ICD-10-CM | POA: Insufficient documentation

## 2020-02-13 DIAGNOSIS — Z79899 Other long term (current) drug therapy: Secondary | ICD-10-CM | POA: Diagnosis not present

## 2020-02-13 DIAGNOSIS — Z79810 Long term (current) use of selective estrogen receptor modulators (SERMs): Secondary | ICD-10-CM | POA: Diagnosis not present

## 2020-02-13 DIAGNOSIS — Z8042 Family history of malignant neoplasm of prostate: Secondary | ICD-10-CM | POA: Diagnosis not present

## 2020-02-13 DIAGNOSIS — Z5111 Encounter for antineoplastic chemotherapy: Secondary | ICD-10-CM | POA: Insufficient documentation

## 2020-02-13 DIAGNOSIS — I10 Essential (primary) hypertension: Secondary | ICD-10-CM | POA: Diagnosis not present

## 2020-02-13 DIAGNOSIS — Z803 Family history of malignant neoplasm of breast: Secondary | ICD-10-CM | POA: Insufficient documentation

## 2020-02-13 DIAGNOSIS — Z8249 Family history of ischemic heart disease and other diseases of the circulatory system: Secondary | ICD-10-CM | POA: Insufficient documentation

## 2020-02-13 DIAGNOSIS — Z8379 Family history of other diseases of the digestive system: Secondary | ICD-10-CM | POA: Diagnosis not present

## 2020-02-13 DIAGNOSIS — C50812 Malignant neoplasm of overlapping sites of left female breast: Secondary | ICD-10-CM

## 2020-02-13 MED ORDER — FULVESTRANT 250 MG/5ML IM SOLN
500.0000 mg | Freq: Once | INTRAMUSCULAR | Status: AC
  Administered 2020-02-13: 500 mg via INTRAMUSCULAR

## 2020-02-13 NOTE — Patient Instructions (Signed)
Fulvestrant injection What is this medicine? FULVESTRANT (ful VES trant) blocks the effects of estrogen. It is used to treat breast cancer. This medicine may be used for other purposes; ask your health care provider or pharmacist if you have questions. COMMON BRAND NAME(S): FASLODEX What should I tell my health care provider before I take this medicine? They need to know if you have any of these conditions:  bleeding disorders  liver disease  low blood counts, like low white cell, platelet, or red cell counts  an unusual or allergic reaction to fulvestrant, other medicines, foods, dyes, or preservatives  pregnant or trying to get pregnant  breast-feeding How should I use this medicine? This medicine is for injection into a muscle. It is usually given by a health care professional in a hospital or clinic setting. Talk to your pediatrician regarding the use of this medicine in children. Special care may be needed. Overdosage: If you think you have taken too much of this medicine contact a poison control center or emergency room at once. NOTE: This medicine is only for you. Do not share this medicine with others. What if I miss a dose? It is important not to miss your dose. Call your doctor or health care professional if you are unable to keep an appointment. What may interact with this medicine?  medicines that treat or prevent blood clots like warfarin, enoxaparin, dalteparin, apixaban, dabigatran, and rivaroxaban This list may not describe all possible interactions. Give your health care provider a list of all the medicines, herbs, non-prescription drugs, or dietary supplements you use. Also tell them if you smoke, drink alcohol, or use illegal drugs. Some items may interact with your medicine. What should I watch for while using this medicine? Your condition will be monitored carefully while you are receiving this medicine. You will need important blood work done while you are taking  this medicine. Do not become pregnant while taking this medicine or for at least 1 year after stopping it. Women of child-bearing potential will need to have a negative pregnancy test before starting this medicine. Women should inform their doctor if they wish to become pregnant or think they might be pregnant. There is a potential for serious side effects to an unborn child. Men should inform their doctors if they wish to father a child. This medicine may lower sperm counts. Talk to your health care professional or pharmacist for more information. Do not breast-feed an infant while taking this medicine or for 1 year after the last dose. What side effects may I notice from receiving this medicine? Side effects that you should report to your doctor or health care professional as soon as possible:  allergic reactions like skin rash, itching or hives, swelling of the face, lips, or tongue  feeling faint or lightheaded, falls  pain, tingling, numbness, or weakness in the legs  signs and symptoms of infection like fever or chills; cough; flu-like symptoms; sore throat  vaginal bleeding Side effects that usually do not require medical attention (report to your doctor or health care professional if they continue or are bothersome):  aches, pains  constipation  diarrhea  headache  hot flashes  nausea, vomiting  pain at site where injected  stomach pain This list may not describe all possible side effects. Call your doctor for medical advice about side effects. You may report side effects to FDA at 1-800-FDA-1088. Where should I keep my medicine? This drug is given in a hospital or clinic and will   not be stored at home. NOTE: This sheet is a summary. It may not cover all possible information. If you have questions about this medicine, talk to your doctor, pharmacist, or health care provider.  2020 Elsevier/Gold Standard (2017-08-04 11:34:41)  

## 2020-02-13 NOTE — Telephone Encounter (Signed)
Patient called to report discomfort and burning sensation after receiving Faslodex injection today.    Pt denies any swelling. Pt able to bear weight to both lower extremities.    RN encouraged use of OTC analgesic such as Tylenol, warm compress to bilateral buttock sites, and to try to keep weight bearing to a minium to allow for rest.    Pt verbalized understanding and agreement.  Pt will report if symptoms worsen or are not alleviated.

## 2020-03-04 NOTE — Progress Notes (Signed)
[name]  [phone] [phone]   ID: [name]   DOB: Sep 01, 1955  MR#: [id number]  QHU#:[id number]  Patient Care Team: Marton Redwood, MD as PCP - General (Internal Medicine) [name], Virgie Dad, MD as Attending Physician (Hematology and Oncology) Arloa Koh, MD (Inactive) as Attending Physician (Radiation Oncology) Alphonsa Overall, MD as Consulting Physician (General Surgery) Crissie Reese, MD as Consulting Physician (Plastic Surgery)  OTHER MD: [name], MD (GYN)   CHIEF COMPLAINT:  Left Breast Cancer (s/p left mastectomy)  CURRENT TREATMENT: fulvestrant   INTERVAL HISTORY: [name] was scheduled today for follow-up of her recurrent left breast cancer however she did not show for her 10/27 /2021 visit.   She was started on fulvestrant 12/12/2019.  She received the most recent dose on 02/13/2020.  Her next dose accordingly would be due March 12, 2020  Her most recent mammogram at [facility] was 09/27/2019 and showed breast density category B.  There was no evidence of malignancy.  Bone density obtained the same day showed a T score of -1.0 (normal).  REVIEW OF SYSTEMS: [place]    HISTORY OF PRESENT ILLNESS: [name] was referred by [name] for treatment of left breast cancer.   The patient underwent screening bilateral mammography July 04, 2009, at [facility] and this showed only a scattered fibroglandular densities. However, repeat screening exam Sep 29, 2010 showed a potential abnormality in the left breast, retroareolar region. The patient was recalled for additional views Oct 08, 2010 and Dr. [name] was able to locate the nodular density noted on the screening exam. It measured approximately 8 mm. Ultrasound showed a second nodular density just lateral to the nipple areolar complex. In that area, there was what appears to be either a small cluster of cysts or perhaps a solid nodule. In particular, the nodule seen in the 3 o'clock  position was felt to be most likely a fibroadenoma. The other area, however, required biopsy and this was performed October 26, 2010. The pathology from this procedure ([id number]) showed a high-grade invasive ductal carcinoma which was estrogen-receptor positive at 98%, progesterone-receptor positive at 13% with an elevated MIB-1 at 69% and no evidence of HER-2 amplification, the ratio by CISH being 1.32.   With this information the patient was referred for bilateral breast MRIs and these were performed October 30, 2010. In the left breast there was a 1.8 cm ill-defined area in the central breast associated with post biopsy changes. There were really no other suspicious areas in either breast and no bony abnormalities and no abnormal appearing or enlarged lymph nodes.   Patient underwent left lumpectomy and sentinel lymph node sampling December 10, 2010 ([id number]) for a 1.1-cm invasive ductal carcinoma, grade 3, with 0/2 sentinel lymph nodes involved and so T1c N0 (stage I); the tumor being estrogen receptor 98% and progesterone receptor 13% positive, with no HER-2 amplification (by initial determination, but amplified on repeat with a ratio of 2.30); with an Oncotype DX score of 43 predicting a 29% risk of recurrence if she takes 5 years of tamoxifen. (Her-2 was negative on the Oncotype DX determination, which uses a completely different method).   Her subsequent history is as detailed below.   PAST MEDICAL HISTORY: Past Medical History:  Diagnosis Date  . BRCA negative   . Breast cancer ([place]) 2018   stage I high grade invasive ductal ca left breast  . Breast lump    left  . Cancer ([place]) 2012   lumpectomy--Lt.Br.  .  Family history of breast cancer 06/23/2018  . Family history of prostate cancer 06/23/2018  . Gum disease   . Hyperlipidemia    diet and exercise controlled, no med  . Hypertension   . Pre-diabetes    borderline - diet and exercise contolled, no meds  . Sleep apnea    uses CPAP  nightly  . Urinary incontinence     PAST SURGICAL HISTORY: Past Surgical History:  Procedure Laterality Date  . BREAST LUMPECTOMY  2012   left  . COLONOSCOPY    . FOOT MASS EXCISION    . LAPAROSCOPIC GASTRIC BANDING    . LATISSIMUS FLAP TO BREAST Left 03/01/2019   Procedure: LEFT BREAST LATIISSIMUS FLAP;  Surgeon: Crissie Reese, MD;  Location: [facility];  Service: Plastics;  Laterality: Left;  [name] MASS EXCISION     back  . MASTECTOMY W/ SENTINEL NODE BIOPSY Left 03/01/2019   Procedure: LEFT MASTECTOMY WITH LEFT SENTINEL LYMPH NODE BIOPSY;  Surgeon: Alphonsa Overall, MD;  Location: [facility];  Service: General;  Laterality: Left;  . PLACEMENT OF BREAST IMPLANTS Left 03/01/2019   Procedure: PLACEMENT OF SALINE BREAST IMPLANT;  Surgeon: Crissie Reese, MD;  Location: [facility];  Service: Plastics;  Laterality: Left;  . PORTACATH PLACEMENT  02/11/11  . PORTACATH REMOVAL  MARCH 2014  . RESECTOSCOPIC POLYPECTOMY  2001   MYOMECTOMY  . SOFT TISSUE CYST EXCISION     back cyst  . VAGINAL MASS EXCISION      FAMILY HISTORY Family History  Problem Relation Age of Onset  . Cancer Maternal Uncle 41       prostate, metastatic  . Alcohol abuse Father   . Cirrhosis Father   . Breast cancer Mother 75       Stage 1  . Hypertension Mother     GYNECOLOGIC HISTORY: ((Updated 01/26/2013) She had menarche at age 59. She is GX P0.  Last menstrual period was late May 2012.  She has never used hormone replacement.     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  She is divorced and lives by herself. She attends a [employer]. She used to go to the gym at least three times a week.    ADVANCED DIRECTIVES: Not in place   HEALTH MAINTENANCE: Social History   Tobacco Use  . Smoking status: Former Smoker    Packs/day: 1.00    Years: 21.00    Pack years: 21.00    Types: Cigarettes    Quit date: 03/25/1999    Years since quitting: 20.9  . Smokeless  tobacco: Never Used  Vaping Use  . Vaping Use: Never used  Substance Use Topics  . Alcohol use: Yes    Alcohol/week: 7.0 standard drinks    Types: 7 Glasses of wine per week    Comment: red wine  . Drug use: No     Colonoscopy:   PAP:   Bone density: March 2011, Normal  Lipid panel:    No Known Allergies  Current Outpatient Medications  Medication Sig Dispense Refill  . amLODipine-olmesartan (AZOR) 10-40 MG per tablet Take 1 tablet by mouth daily. (Patient taking differently: Take 1 tablet by mouth at bedtime. ) 30 tablet 6  . calcium carbonate (OS-CAL) 1250 (500 Ca) MG chewable tablet Chew by mouth.    . cyanocobalamin 1000 MCG tablet Take 1 tablet by mouth as needed.    [name] ELDERBERRY PO Take 1 tablet by mouth as needed.    [name]  Multiple Vitamin (MULTIVITAMIN) capsule Take 1 capsule by mouth daily.    [name] triamterene-hydrochlorothiazide (DYAZIDE) 37.5-25 MG capsule Take 1 each (1 capsule total) by mouth daily.     No current facility-administered medications for this visit.    OBJECTIVE: There were no vitals filed for this visit.   There is no height or weight on file to calculate BMI.    ECOG FS: 1 There were no vitals filed for this visit.     LAB RESULTS: Lab Results  Component Value Date   WBC 3.9 (L) 08/16/2019   NEUTROABS 2.1 08/16/2019   HGB 12.6 08/16/2019   HCT 40.2 08/16/2019   MCV 82.7 08/16/2019   PLT 297 08/16/2019      Chemistry      Component Value Date/Time   NA 140 08/16/2019 1008   NA 140 04/22/2017 1327   K 3.8 08/16/2019 1008   K 4.0 04/22/2017 1327   CL 105 08/16/2019 1008   CL 105 09/08/2012 1320   CO2 26 08/16/2019 1008   CO2 25 04/22/2017 1327   BUN 13 08/16/2019 1008   BUN 15.0 04/22/2017 1327   CREATININE 0.83 08/16/2019 1008   CREATININE 0.8 04/22/2017 1327      Component Value Date/Time   CALCIUM 9.2 08/16/2019 1008   CALCIUM 9.0 04/22/2017 1327   ALKPHOS 66 08/16/2019 1008   ALKPHOS 58 04/22/2017 1327   AST 13 (L) 08/16/2019  1008   AST 13 04/22/2017 1327   ALT 14 08/16/2019 1008   ALT 11 04/22/2017 1327   BILITOT 0.5 08/16/2019 1008   BILITOT 0.38 04/22/2017 1327      STUDIES: No results found.    ASSESSMENT: 64 y.o. [place] woman   1. Status post left lumpectomy and sentinel lymph node sampling August 2012 for a T1cN0, stage IA invasive ductal carcinoma of overlapping sites, grade 3, estrogen receptor 98% and progesterone receptor 13% positive, HER-2/neu amplified with a ratio of 2.3.   2. Oncotype showed a recurrence score of 43 ( "high risk"), predicting a 29% risk of recurrence if the patient's only adjuvant treatment was tamoxifen   3. received adjuvant docetaxel/ cyclophosphamide x4, completed December 2012   4. trastuzumab started 04/15/2011, completed 05/05/2012; final echo 02/18/2012  5. radiation therapy, completed July 23, 2011  6. began on tamoxifen March 2013, but never taken consistently   RECURRENT DISEASE: November 2018 7.  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  Anastrozole started 04/22/2017, discontinued January 2020, poorly tolerated  (a) mammography at [facility] 03/20/2018 shows evidence of growth  (b) exemestane started January 2020, continued with significant interruptions  (c) bone density 09/27/2019 showed a T score of -1.0.  (d) exemestane discontinued April 2021 with multiple side effects  9.  Status post left mastectomy with sentinel lymph node sampling 03/01/2019 for a pT2 pN0, stage IB  invasive ductal carcinoma, grade 2, with negative margins.  The invasive tumor was estrogen and progesterone receptor positive, with an MIB-1 of 30%.  HER-2 was not amplified.  (a) immediate left latissimus reconstruction with saline implant placement  10.  Genetics testing 07/17/2018 through the  Common Hereditary Cancers Panel offered by [facility] found no deleterious mutations in APC, ATM, AXIN2, BARD1, BMPR1A, BRCA1, BRCA2, BRIP1, CDH1, CDKN2A (p14ARF), CDKN2A (p16INK4a), CKD4, CHEK2, CTNNA1, DICER1, EPCAM (Deletion/duplication testing only), GREM1 (promoter region deletion/duplication  testing only), KIT, MEN1, MLH1, MSH2, MSH3, MSH6, MUTYH, NBN, NF1, NHTL1, PALB2, PDGFRA, PMS2, POLD1, POLE, PTEN, RAD50, RAD51C, RAD51D, SDHB, SDHC, SDHD, SMAD4, SMARCA4. STK11, TP53, TSC1, TSC2, and VHL.  The following genes were evaluated for sequence changes only: SDHA and HOXB13 c.251G>A variant only  11.  Fulvestrant started 08/28/2019, repeated every 28 days--to be continued through December 2024  PLAN: Torian was scheduled for a visit and dose of fulvestrant today but it is a week early.  Possibly this was a mistake.  In any case she has no further appointment.  I am going to reschedule her for a week from now with fulvestrant and a visit.   Onyekachi Gathright, Virgie Dad, MD  03/05/20 9:32 AM Medical Oncology and Hematology [facility] [place], [place] [address] Tel. [phone]    Fax. [phone]   I, Wilburn Mylar, am acting as scribe for Dr. Virgie Dad. Ankur Snowdon.  I, Lurline Del MD, have reviewed the above documentation for accuracy and completeness, and I agree with the above.   *Total Encounter Time as defined by the Centers for Medicare and Medicaid Services includes, in addition to the face-to-face time of a patient visit (documented in the note above) non-face-to-face time: obtaining and reviewing outside history, ordering and reviewing medications, tests or procedures, care coordination (communications with other health care professionals or caregivers) and documentation in the medical record.

## 2020-03-05 ENCOUNTER — Inpatient Hospital Stay: Payer: BC Managed Care – PPO

## 2020-03-05 ENCOUNTER — Telehealth: Payer: Self-pay | Admitting: Oncology

## 2020-03-05 ENCOUNTER — Encounter: Payer: Self-pay | Admitting: Oncology

## 2020-03-05 ENCOUNTER — Inpatient Hospital Stay (HOSPITAL_BASED_OUTPATIENT_CLINIC_OR_DEPARTMENT_OTHER): Payer: BC Managed Care – PPO | Admitting: Oncology

## 2020-03-05 DIAGNOSIS — C50812 Malignant neoplasm of overlapping sites of left female breast: Secondary | ICD-10-CM

## 2020-03-05 DIAGNOSIS — Z79811 Long term (current) use of aromatase inhibitors: Secondary | ICD-10-CM

## 2020-03-05 DIAGNOSIS — Z170 Estrogen receptor positive status [ER+]: Secondary | ICD-10-CM

## 2020-03-05 DIAGNOSIS — Z9012 Acquired absence of left breast and nipple: Secondary | ICD-10-CM

## 2020-03-05 NOTE — Telephone Encounter (Signed)
Called pt per 10/27 sch msg - no answer. Left message for patient to call back to reschedule appt.

## 2020-03-11 ENCOUNTER — Telehealth: Payer: Self-pay | Admitting: Oncology

## 2020-03-11 NOTE — Telephone Encounter (Signed)
Scheduled per 10/27 los. Called pt and left a msg

## 2020-03-12 ENCOUNTER — Inpatient Hospital Stay: Payer: BC Managed Care – PPO

## 2020-03-12 ENCOUNTER — Inpatient Hospital Stay: Payer: BC Managed Care – PPO | Admitting: Adult Health

## 2020-03-12 NOTE — Progress Notes (Deleted)
[facility]  [phone] [phone]   ID: [name]   DOB: 04/16/1956  MR#: [id number]  CSN#:[id number]  Patient Care Team: Shaw, William, MD as PCP - General (Internal Medicine) Magrinat, Gustav C, MD as Attending Physician (Hematology and Oncology) Murray, Robert, MD (Inactive) as Attending Physician (Radiation Oncology) Newman, David, MD as Consulting Physician (General Surgery) Bowers, David, MD as Consulting Physician (Plastic Surgery)  OTHER MD: Juan Fernandez, MD (GYN)   CHIEF COMPLAINT:  Left Breast Cancer (s/p left mastectomy)  CURRENT TREATMENT: fulvestrant   INTERVAL HISTORY: [name] was  REVIEW OF SYSTEMS: [name]    HISTORY OF PRESENT ILLNESS: [name] was referred by Dr. Shaw for treatment of left breast cancer.   The patient underwent screening bilateral mammography July 04, 2009, at [facility] and this showed only a scattered fibroglandular densities. However, repeat screening exam Sep 29, 2010 showed a potential abnormality in the left breast, retroareolar region. The patient was recalled for additional views Oct 08, 2010 and Dr. Faris was able to locate the nodular density noted on the screening exam. It measured approximately 8 mm. Ultrasound showed a second nodular density just lateral to the nipple areolar complex. In that area, there was what appears to be either a small cluster of cysts or perhaps a solid nodule. In particular, the nodule seen in the 3 o'clock position was felt to be most likely a fibroadenoma. The other area, however, required biopsy and this was performed October 26, 2010. The pathology from this procedure ([id number]) showed a high-grade invasive ductal carcinoma which was estrogen-receptor positive at 98%, progesterone-receptor positive at 13% with an elevated MIB-1 at 69% and no evidence of HER-2 amplification, the ratio by CISH being 1.32.   With this information the patient was referred for bilateral  breast MRIs and these were performed October 30, 2010. In the left breast there was a 1.8 cm ill-defined area in the central breast associated with post biopsy changes. There were really no other suspicious areas in either breast and no bony abnormalities and no abnormal appearing or enlarged lymph nodes.   Patient underwent left lumpectomy and sentinel lymph node sampling December 10, 2010 ([id number]) for a 1.1-cm invasive ductal carcinoma, grade 3, with 0/2 sentinel lymph nodes involved and so T1c N0 (stage I); the tumor being estrogen receptor 98% and progesterone receptor 13% positive, with no HER-2 amplification (by initial determination, but amplified on repeat with a ratio of 2.30); with an Oncotype DX score of 43 predicting a 29% risk of recurrence if she takes 5 years of tamoxifen. (Her-2 was negative on the Oncotype DX determination, which uses a completely different method).   Her subsequent history is as detailed below.   PAST MEDICAL HISTORY: Past Medical History:  Diagnosis Date  . BRCA negative   . 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 Breast lump    left  . Cancer (HCC) 2012   lumpectomy--Lt.Br.  . Family history of breast cancer 06/23/2018  . Family history of prostate cancer 06/23/2018  . Gum disease   . Hyperlipidemia    diet and exercise controlled, no med  . Hypertension   . Pre-diabetes    borderline - diet and exercise contolled, no meds  . Sleep apnea    uses CPAP nightly  . Urinary incontinence     PAST SURGICAL HISTORY: Past Surgical History:  Procedure Laterality Date  . BREAST LUMPECTOMY  2012     left  . COLONOSCOPY    . FOOT MASS EXCISION    . LAPAROSCOPIC GASTRIC BANDING    . LATISSIMUS FLAP TO BREAST Left 03/01/2019   Procedure: LEFT BREAST LATIISSIMUS FLAP;  Surgeon: Bowers, David, MD;  Location: [facility] OR;  Service: Plastics;  Laterality: Left;  . MASS EXCISION     back  . MASTECTOMY W/ SENTINEL NODE BIOPSY Left  03/01/2019   Procedure: LEFT MASTECTOMY WITH LEFT SENTINEL LYMPH NODE BIOPSY;  Surgeon: Newman, David, MD;  Location: [facility] OR;  Service: General;  Laterality: Left;  . PLACEMENT OF BREAST IMPLANTS Left 03/01/2019   Procedure: PLACEMENT OF SALINE BREAST IMPLANT;  Surgeon: Bowers, David, MD;  Location: [facility] OR;  Service: Plastics;  Laterality: Left;  . PORTACATH PLACEMENT  02/11/11  . PORTACATH REMOVAL  MARCH 2014  . RESECTOSCOPIC POLYPECTOMY  2001   MYOMECTOMY  . SOFT TISSUE CYST EXCISION     back cyst  . VAGINAL MASS EXCISION      FAMILY HISTORY Family History  Problem Relation Age of Onset  . Cancer Maternal Uncle 70       prostate, metastatic  . Alcohol abuse Father   . Cirrhosis Father   . Breast cancer Mother 78       Stage 1  . Hypertension Mother     GYNECOLOGIC HISTORY: ((Updated 01/26/2013) She had menarche at age 15. She is GX P0.  Last menstrual period was late May 2012.  She has never used hormone replacement.     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  She is divorced and lives by herself. She attends a local [employer]. She used to go to the gym at least three times a week.    ADVANCED DIRECTIVES: Not in place   HEALTH MAINTENANCE: Social History   Tobacco Use  . Smoking status: Former Smoker    Packs/day: 1.00    Years: 21.00    Pack years: 21.00    Types: Cigarettes    Quit date: 03/25/1999    Years since quitting: 20.9  . Smokeless tobacco: Never Used  Vaping Use  . Vaping Use: Never used  Substance Use Topics  . Alcohol use: Yes    Alcohol/week: 7.0 standard drinks    Types: 7 Glasses of wine per week    Comment: red wine  . Drug use: No     Colonoscopy:   PAP:   Bone density: March 2011, Normal  Lipid panel:    No Known Allergies  Current Outpatient Medications  Medication Sig Dispense Refill  . amLODipine-olmesartan (AZOR) 10-40 MG per tablet Take 1 tablet by mouth daily.  (Patient taking differently: Take 1 tablet by mouth at bedtime. ) 30 tablet 6  . calcium carbonate (OS-CAL) 1250 (500 Ca) MG chewable tablet Chew by mouth.    . cyanocobalamin 1000 MCG tablet Take 1 tablet by mouth as needed.    . ELDERBERRY PO Take 1 tablet by mouth as needed.    . Multiple Vitamin (MULTIVITAMIN) capsule Take 1 capsule by mouth daily.    . triamterene-hydrochlorothiazide (DYAZIDE) 37.5-25 MG capsule Take 1 each (1 capsule total) by mouth daily.     No current facility-administered medications for this visit.    OBJECTIVE: There were no vitals filed for this visit.   There is no height or weight on file to calculate BMI.    ECOG FS: 1 There were no vitals filed for this visit.       LAB RESULTS: Lab Results  Component Value Date   WBC 3.9 (L) 08/16/2019   NEUTROABS 2.1 08/16/2019   HGB 12.6 08/16/2019   HCT 40.2 08/16/2019   MCV 82.7 08/16/2019   PLT 297 08/16/2019      Chemistry      Component Value Date/Time   NA 140 08/16/2019 1008   NA 140 04/22/2017 1327   K 3.8 08/16/2019 1008   K 4.0 04/22/2017 1327   CL 105 08/16/2019 1008   CL 105 09/08/2012 1320   CO2 26 08/16/2019 1008   CO2 25 04/22/2017 1327   BUN 13 08/16/2019 1008   BUN 15.0 04/22/2017 1327   CREATININE 0.83 08/16/2019 1008   CREATININE 0.8 04/22/2017 1327      Component Value Date/Time   CALCIUM 9.2 08/16/2019 1008   CALCIUM 9.0 04/22/2017 1327   ALKPHOS 66 08/16/2019 1008   ALKPHOS 58 04/22/2017 1327   AST 13 (L) 08/16/2019 1008   AST 13 04/22/2017 1327   ALT 14 08/16/2019 1008   ALT 11 04/22/2017 1327   BILITOT 0.5 08/16/2019 1008   BILITOT 0.38 04/22/2017 1327      STUDIES: No results found.    ASSESSMENT: 64 y.o.  woman   1. Status post left lumpectomy and sentinel lymph node sampling August 2012 for a T1cN0, stage IA invasive ductal carcinoma of overlapping sites, grade 3, estrogen receptor 98% and progesterone receptor 13% positive, HER-2/neu amplified  with a ratio of 2.3.   2. Oncotype showed a recurrence score of 43 ( "high risk"), predicting a 29% risk of recurrence if the patient's only adjuvant treatment was tamoxifen   3. received adjuvant docetaxel/ cyclophosphamide x4, completed December 2012   4. trastuzumab started 04/15/2011, completed 05/05/2012; final echo 02/18/2012  5. radiation therapy, completed July 23, 2011  6. began on tamoxifen March 2013, but never taken consistently   RECURRENT DISEASE: November 2018 7.  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  Anastrozole started 04/22/2017, discontinued January 2020, poorly tolerated  (a) mammography at [facility] 03/20/2018 shows evidence of growth  (b) exemestane started January 2020, continued with significant interruptions  (c) bone density 09/27/2019 showed a T score of -1.0.  (d) exemestane discontinued April 2021 with multiple side effects  9.  Status post left mastectomy with sentinel lymph node sampling 03/01/2019 for a pT2 pN0, stage IB  invasive ductal carcinoma, grade 2, with negative margins.  The invasive tumor was estrogen and progesterone receptor positive, with an MIB-1 of 30%.  HER-2 was not amplified.  (a) immediate left latissimus reconstruction with saline implant placement  10.  Genetics testing 07/17/2018 through the Common Hereditary Cancers Panel offered by [facility] found no deleterious mutations in APC, ATM, AXIN2, BARD1, BMPR1A, BRCA1, BRCA2, BRIP1, CDH1, CDKN2A (p14ARF), CDKN2A (p16INK4a), CKD4, CHEK2, CTNNA1, DICER1, EPCAM (Deletion/duplication testing only), GREM1 (promoter region deletion/duplication testing only), KIT, MEN1, MLH1, MSH2, MSH3, MSH6, MUTYH, NBN, NF1, NHTL1, PALB2, PDGFRA, PMS2, POLD1, POLE, PTEN, RAD50, RAD51C, RAD51D, SDHB, SDHC, SDHD, SMAD4, SMARCA4.  STK11, TP53, TSC1, TSC2, and VHL.  The following genes were evaluated for sequence changes only: SDHA and HOXB13 c.251G>A variant only  11.  Fulvestrant started 08/28/2019, repeated every 28 days--to be continued through December 2024  PLAN: Laysa    Total encounter time: *** minutes  Lindsey Causey, NP 03/12/20 8:27 AM Medical   Oncology and Hematology [facility] [address] [place], [place] [address] Tel. [phone]    Fax. [phone]    *Total Encounter Time as defined by the Centers for Medicare and Medicaid Services includes, in addition to the face-to-face time of a patient visit (documented in the note above) non-face-to-face time: obtaining and reviewing outside history, ordering and reviewing medications, tests or procedures, care coordination (communications with other health care professionals or caregivers) and documentation in the medical record.  

## 2020-03-18 ENCOUNTER — Encounter: Payer: Self-pay | Admitting: Obstetrics & Gynecology

## 2020-03-24 ENCOUNTER — Other Ambulatory Visit: Payer: Self-pay

## 2020-03-24 ENCOUNTER — Inpatient Hospital Stay: Payer: BC Managed Care – PPO

## 2020-03-24 ENCOUNTER — Inpatient Hospital Stay (HOSPITAL_BASED_OUTPATIENT_CLINIC_OR_DEPARTMENT_OTHER): Payer: BC Managed Care – PPO | Admitting: Adult Health

## 2020-03-24 ENCOUNTER — Inpatient Hospital Stay: Payer: BC Managed Care – PPO | Attending: Oncology

## 2020-03-24 ENCOUNTER — Encounter: Payer: Self-pay | Admitting: Adult Health

## 2020-03-24 ENCOUNTER — Telehealth: Payer: Self-pay | Admitting: Oncology

## 2020-03-24 VITALS — BP 135/75 | HR 70 | Temp 98.6°F | Resp 17 | Ht 60.0 in | Wt 222.2 lb

## 2020-03-24 DIAGNOSIS — Z79899 Other long term (current) drug therapy: Secondary | ICD-10-CM | POA: Insufficient documentation

## 2020-03-24 DIAGNOSIS — E785 Hyperlipidemia, unspecified: Secondary | ICD-10-CM | POA: Insufficient documentation

## 2020-03-24 DIAGNOSIS — D0512 Intraductal carcinoma in situ of left breast: Secondary | ICD-10-CM | POA: Diagnosis not present

## 2020-03-24 DIAGNOSIS — Z5111 Encounter for antineoplastic chemotherapy: Secondary | ICD-10-CM | POA: Diagnosis present

## 2020-03-24 DIAGNOSIS — Z87891 Personal history of nicotine dependence: Secondary | ICD-10-CM | POA: Insufficient documentation

## 2020-03-24 DIAGNOSIS — Z8249 Family history of ischemic heart disease and other diseases of the circulatory system: Secondary | ICD-10-CM | POA: Diagnosis not present

## 2020-03-24 DIAGNOSIS — Z170 Estrogen receptor positive status [ER+]: Secondary | ICD-10-CM | POA: Insufficient documentation

## 2020-03-24 DIAGNOSIS — Z803 Family history of malignant neoplasm of breast: Secondary | ICD-10-CM | POA: Diagnosis not present

## 2020-03-24 DIAGNOSIS — Z923 Personal history of irradiation: Secondary | ICD-10-CM | POA: Insufficient documentation

## 2020-03-24 DIAGNOSIS — C50812 Malignant neoplasm of overlapping sites of left female breast: Secondary | ICD-10-CM

## 2020-03-24 DIAGNOSIS — C50919 Malignant neoplasm of unspecified site of unspecified female breast: Secondary | ICD-10-CM

## 2020-03-24 DIAGNOSIS — C50112 Malignant neoplasm of central portion of left female breast: Secondary | ICD-10-CM | POA: Insufficient documentation

## 2020-03-24 DIAGNOSIS — Z9012 Acquired absence of left breast and nipple: Secondary | ICD-10-CM | POA: Diagnosis not present

## 2020-03-24 DIAGNOSIS — I10 Essential (primary) hypertension: Secondary | ICD-10-CM | POA: Insufficient documentation

## 2020-03-24 DIAGNOSIS — Z8042 Family history of malignant neoplasm of prostate: Secondary | ICD-10-CM | POA: Insufficient documentation

## 2020-03-24 DIAGNOSIS — Z79810 Long term (current) use of selective estrogen receptor modulators (SERMs): Secondary | ICD-10-CM | POA: Insufficient documentation

## 2020-03-24 DIAGNOSIS — Z1379 Encounter for other screening for genetic and chromosomal anomalies: Secondary | ICD-10-CM

## 2020-03-24 DIAGNOSIS — C50912 Malignant neoplasm of unspecified site of left female breast: Secondary | ICD-10-CM

## 2020-03-24 LAB — COMPREHENSIVE METABOLIC PANEL
ALT: 11 U/L (ref 0–44)
AST: 14 U/L — ABNORMAL LOW (ref 15–41)
Albumin: 3.8 g/dL (ref 3.5–5.0)
Alkaline Phosphatase: 57 U/L (ref 38–126)
Anion gap: 7 (ref 5–15)
BUN: 13 mg/dL (ref 8–23)
CO2: 27 mmol/L (ref 22–32)
Calcium: 9.1 mg/dL (ref 8.9–10.3)
Chloride: 105 mmol/L (ref 98–111)
Creatinine, Ser: 0.82 mg/dL (ref 0.44–1.00)
GFR, Estimated: >60 mL/min (ref >60)
Glucose, Bld: 87 mg/dL (ref 70–99)
Potassium: 3.8 mmol/L (ref 3.5–5.1)
Sodium: 139 mmol/L (ref 135–145)
Total Bilirubin: 0.5 mg/dL (ref 0.3–1.2)
Total Protein: 7.5 g/dL (ref 6.5–8.1)

## 2020-03-24 LAB — CBC WITH DIFFERENTIAL/PLATELET
Abs Immature Granulocytes: 0.01 10*3/uL (ref 0.00–0.07)
Basophils Absolute: 0 10*3/uL (ref 0.0–0.1)
Basophils Relative: 0 %
Eosinophils Absolute: 0 10*3/uL (ref 0.0–0.5)
Eosinophils Relative: 1 %
HCT: 38.7 % (ref 36.0–46.0)
Hemoglobin: 12.4 g/dL (ref 12.0–15.0)
Immature Granulocytes: 0 %
Lymphocytes Relative: 32 %
Lymphs Abs: 1.8 10*3/uL (ref 0.7–4.0)
MCH: 26.5 pg (ref 26.0–34.0)
MCHC: 32 g/dL (ref 30.0–36.0)
MCV: 82.7 fL (ref 80.0–100.0)
Monocytes Absolute: 0.4 10*3/uL (ref 0.1–1.0)
Monocytes Relative: 7 %
Neutro Abs: 3.3 10*3/uL (ref 1.7–7.7)
Neutrophils Relative %: 60 %
Platelets: 308 10*3/uL (ref 150–400)
RBC: 4.68 MIL/uL (ref 3.87–5.11)
RDW: 14.2 % (ref 11.5–15.5)
WBC: 5.5 10*3/uL (ref 4.0–10.5)
nRBC: 0 % (ref 0.0–0.2)

## 2020-03-24 MED ORDER — FULVESTRANT 250 MG/5ML IM SOLN
500.0000 mg | Freq: Once | INTRAMUSCULAR | Status: AC
  Administered 2020-03-24: 500 mg via INTRAMUSCULAR

## 2020-03-24 MED ORDER — FULVESTRANT 250 MG/5ML IM SOLN
INTRAMUSCULAR | Status: AC
  Filled 2020-03-24: qty 5

## 2020-03-24 NOTE — Telephone Encounter (Signed)
Scheduled appts per 11/15 los. Gave pt a print out of AVS.

## 2020-03-24 NOTE — Patient Instructions (Signed)
Fulvestrant injection What is this medicine? FULVESTRANT (ful VES trant) blocks the effects of estrogen. It is used to treat breast cancer. This medicine may be used for other purposes; ask your health care provider or pharmacist if you have questions. COMMON BRAND NAME(S): FASLODEX What should I tell my health care provider before I take this medicine? They need to know if you have any of these conditions:  bleeding disorders  liver disease  low blood counts, like low white cell, platelet, or red cell counts  an unusual or allergic reaction to fulvestrant, other medicines, foods, dyes, or preservatives  pregnant or trying to get pregnant  breast-feeding How should I use this medicine? This medicine is for injection into a muscle. It is usually given by a health care professional in a hospital or clinic setting. Talk to your pediatrician regarding the use of this medicine in children. Special care may be needed. Overdosage: If you think you have taken too much of this medicine contact a poison control center or emergency room at once. NOTE: This medicine is only for you. Do not share this medicine with others. What if I miss a dose? It is important not to miss your dose. Call your doctor or health care professional if you are unable to keep an appointment. What may interact with this medicine?  medicines that treat or prevent blood clots like warfarin, enoxaparin, dalteparin, apixaban, dabigatran, and rivaroxaban This list may not describe all possible interactions. Give your health care provider a list of all the medicines, herbs, non-prescription drugs, or dietary supplements you use. Also tell them if you smoke, drink alcohol, or use illegal drugs. Some items may interact with your medicine. What should I watch for while using this medicine? Your condition will be monitored carefully while you are receiving this medicine. You will need important blood work done while you are taking  this medicine. Do not become pregnant while taking this medicine or for at least 1 year after stopping it. Women of child-bearing potential will need to have a negative pregnancy test before starting this medicine. Women should inform their doctor if they wish to become pregnant or think they might be pregnant. There is a potential for serious side effects to an unborn child. Men should inform their doctors if they wish to father a child. This medicine may lower sperm counts. Talk to your health care professional or pharmacist for more information. Do not breast-feed an infant while taking this medicine or for 1 year after the last dose. What side effects may I notice from receiving this medicine? Side effects that you should report to your doctor or health care professional as soon as possible:  allergic reactions like skin rash, itching or hives, swelling of the face, lips, or tongue  feeling faint or lightheaded, falls  pain, tingling, numbness, or weakness in the legs  signs and symptoms of infection like fever or chills; cough; flu-like symptoms; sore throat  vaginal bleeding Side effects that usually do not require medical attention (report to your doctor or health care professional if they continue or are bothersome):  aches, pains  constipation  diarrhea  headache  hot flashes  nausea, vomiting  pain at site where injected  stomach pain This list may not describe all possible side effects. Call your doctor for medical advice about side effects. You may report side effects to FDA at 1-800-FDA-1088. Where should I keep my medicine? This drug is given in a hospital or clinic and will   not be stored at home. NOTE: This sheet is a summary. It may not cover all possible information. If you have questions about this medicine, talk to your doctor, pharmacist, or health care provider.  2020 Elsevier/Gold Standard (2017-08-04 11:34:41)  

## 2020-03-24 NOTE — Progress Notes (Signed)
[name]  Telephone:(336) [phone] [phone]   ID: OMA MARZAN   DOB: March 21, 1956  MR#: [id number]  JZP#:[id number]  Patient Care Team: Marton Redwood, MD as PCP - General (Internal Medicine) Magrinat, Virgie Dad, MD as Attending Physician (Hematology and Oncology) Arloa Koh, MD (Inactive) as Attending Physician (Radiation Oncology) Alphonsa Overall, MD as Consulting Physician (General Surgery) Crissie Reese, MD as Consulting Physician (Plastic Surgery)  OTHER MD: [name], MD (GYN)   CHIEF COMPLAINT:  Left Breast Cancer (s/p left mastectomy)  CURRENT TREATMENT: fulvestrant   INTERVAL HISTORY: [name] was scheduled today for follow-up of her recurrent left breast cancer    She continues on Fulvestrant every 4 weeks with good tolerance.    Her most recent mammogram at [facility] was 09/27/2019 and showed breast density category B.  There was no evidence of malignancy.  Bone density obtained the same day showed a T score of -1.0 (normal).  REVIEW OF SYSTEMS: [name] is doing well today.  She has been exercising lately and notes she recently did time on the elliptical.  She notes behind her knee has been painful ever since.  She notes that she feels a lump at her left inframammary fold and a skin change at the left breast surgical site.    She denies any new issues and is up to date on seeing her PCP and her regular health maintenance.  A detailed ROS was otherwise non contributory.     HISTORY OF PRESENT ILLNESS: [name] was referred by [name] for treatment of left breast cancer.   The patient underwent screening bilateral mammography July 04, 2009, at [facility] and this showed only a scattered fibroglandular densities. However, repeat screening exam Sep 29, 2010 showed a potential abnormality in the left breast, retroareolar region. The patient was recalled for additional views Oct 08, 2010 and Dr. [name] was able to locate the nodular density noted  on the screening exam. It measured approximately 8 mm. Ultrasound showed a second nodular density just lateral to the nipple areolar complex. In that area, there was what appears to be either a small cluster of cysts or perhaps a solid nodule. In particular, the nodule seen in the 3 o'clock position was felt to be most likely a fibroadenoma. The other area, however, required biopsy and this was performed October 26, 2010. The pathology from this procedure ([id number]) showed a high-grade invasive ductal carcinoma which was estrogen-receptor positive at 98%, progesterone-receptor positive at 13% with an elevated MIB-1 at 69% and no evidence of HER-2 amplification, the ratio by CISH being 1.32.   With this information the patient was referred for bilateral breast MRIs and these were performed October 30, 2010. In the left breast there was a 1.8 cm ill-defined area in the central breast associated with post biopsy changes. There were really no other suspicious areas in either breast and no bony abnormalities and no abnormal appearing or enlarged lymph nodes.   Patient underwent left lumpectomy and sentinel lymph node sampling December 10, 2010 ([id number]) for a 1.1-cm invasive ductal carcinoma, grade 3, with 0/2 sentinel lymph nodes involved and so T1c N0 (stage I); the tumor being estrogen receptor 98% and progesterone receptor 13% positive, with no HER-2 amplification (by initial determination, but amplified on repeat with a ratio of 2.30); with an Oncotype DX score of 43 predicting a 29% risk of recurrence if she takes 5 years of tamoxifen. (Her-2 was negative on the Oncotype DX determination, which uses a completely different  method).   Her subsequent history is as detailed below.   PAST MEDICAL HISTORY: Past Medical History:  Diagnosis Date  . BRCA negative   . Breast cancer ([place]) 2018   stage I high grade invasive ductal ca left breast  . Breast lump    left  . Cancer ([place]) 2012   lumpectomy--Lt.Br.    . Family history of breast cancer 06/23/2018  . Family history of prostate cancer 06/23/2018  . Gum disease   . Hyperlipidemia    diet and exercise controlled, no med  . Hypertension   . Pre-diabetes    borderline - diet and exercise contolled, no meds  . Sleep apnea    uses CPAP nightly  . Urinary incontinence     PAST SURGICAL HISTORY: Past Surgical History:  Procedure Laterality Date  . BREAST LUMPECTOMY  2012   left  . COLONOSCOPY    . FOOT MASS EXCISION    . LAPAROSCOPIC GASTRIC BANDING    . LATISSIMUS FLAP TO BREAST Left 03/01/2019   Procedure: LEFT BREAST LATIISSIMUS FLAP;  Surgeon: Crissie Reese, MD;  Location: [facility];  Service: Plastics;  Laterality: Left;  [name] MASS EXCISION     back  . MASTECTOMY W/ SENTINEL NODE BIOPSY Left 03/01/2019   Procedure: LEFT MASTECTOMY WITH LEFT SENTINEL LYMPH NODE BIOPSY;  Surgeon: Alphonsa Overall, MD;  Location: [facility];  Service: General;  Laterality: Left;  . PLACEMENT OF BREAST IMPLANTS Left 03/01/2019   Procedure: PLACEMENT OF SALINE BREAST IMPLANT;  Surgeon: Crissie Reese, MD;  Location: [facility];  Service: Plastics;  Laterality: Left;  . PORTACATH PLACEMENT  02/11/11  . PORTACATH REMOVAL  MARCH 2014  . RESECTOSCOPIC POLYPECTOMY  2001   MYOMECTOMY  . SOFT TISSUE CYST EXCISION     back cyst  . VAGINAL MASS EXCISION      FAMILY HISTORY Family History  Problem Relation Age of Onset  . Cancer Maternal Uncle 28       prostate, metastatic  . Alcohol abuse Father   . Cirrhosis Father   . Breast cancer Mother 11       Stage 1  . Hypertension Mother     GYNECOLOGIC HISTORY: ((Updated 01/26/2013) She had menarche at age 66. She is GX P0.  Last menstrual period was late May 2012.  She has never used hormone replacement.     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  She is divorced and lives by herself. She attends a [employer]. She used to go to the gym at least three times a  week.    ADVANCED DIRECTIVES: Not in place   HEALTH MAINTENANCE: Social History   Tobacco Use  . Smoking status: Former Smoker    Packs/day: 1.00    Years: 21.00    Pack years: 21.00    Types: Cigarettes    Quit date: 03/25/1999    Years since quitting: 21.0  . Smokeless tobacco: Never Used  Vaping Use  . Vaping Use: Never used  Substance Use Topics  . Alcohol use: Yes    Alcohol/week: 7.0 standard drinks    Types: 7 Glasses of wine per week    Comment: red wine  . Drug use: No     Colonoscopy:   PAP:   Bone density: March 2011, Normal  Lipid panel:    No Known Allergies  Current Outpatient Medications  Medication Sig Dispense Refill  . amLODipine-olmesartan (AZOR) 10-40 MG per tablet Take  1 tablet by mouth daily. (Patient taking differently: Take 1 tablet by mouth at bedtime. ) 30 tablet 6  . calcium carbonate (OS-CAL) 1250 (500 Ca) MG chewable tablet Chew by mouth.    . cyanocobalamin 1000 MCG tablet Take 1 tablet by mouth as needed.    [name] ELDERBERRY PO Take 1 tablet by mouth as needed.    . Multiple Vitamin (MULTIVITAMIN) capsule Take 1 capsule by mouth daily.    [name] triamterene-hydrochlorothiazide (DYAZIDE) 37.5-25 MG capsule Take 1 each (1 capsule total) by mouth daily.     No current facility-administered medications for this visit.    OBJECTIVE: Vitals:   03/24/20 1237  BP: 135/75  Pulse: 70  Resp: 17  Temp: 98.6 F (37 C)  SpO2: 99%     Body mass index is 43.4 kg/m.    ECOG FS: 1 Filed Weights   03/24/20 1237  Weight: 222 lb 3.2 oz (100.8 kg)  GENERAL: Patient is a well appearing female in no acute distress HEENT:  Sclerae anicteric.  Mask in place. Neck is supple.  NODES:  No cervical, supraclavicular, or axillary lymphadenopathy palpated.  BREAST EXAM:  Right breast benign, left breast s/p mastectomy and reconstruction, sligh skin change along surgical site, otherwise benign LUNGS:  Clear to auscultation bilaterally.  No wheezes or  rhonchi. HEART:  Regular rate and rhythm. No murmur appreciated. ABDOMEN:  Soft, nontender.  Positive, normoactive bowel sounds. No organomegaly palpated. MSK:  No focal spinal tenderness to palpation. Full range of motion bilaterally in the upper extremities. EXTREMITIES:  No peripheral edema.   SKIN:  Clear with no obvious rashes or skin changes. No nail dyscrasia. NEURO:  Nonfocal. Well oriented.  Appropriate affect.       LAB RESULTS: Lab Results  Component Value Date   WBC 5.5 03/24/2020   NEUTROABS 3.3 03/24/2020   HGB 12.4 03/24/2020   HCT 38.7 03/24/2020   MCV 82.7 03/24/2020   PLT 308 03/24/2020      Chemistry      Component Value Date/Time   NA 139 03/24/2020 1218   NA 140 04/22/2017 1327   K 3.8 03/24/2020 1218   K 4.0 04/22/2017 1327   CL 105 03/24/2020 1218   CL 105 09/08/2012 1320   CO2 27 03/24/2020 1218   CO2 25 04/22/2017 1327   BUN 13 03/24/2020 1218   BUN 15.0 04/22/2017 1327   CREATININE 0.82 03/24/2020 1218   CREATININE 0.83 08/16/2019 1008   CREATININE 0.8 04/22/2017 1327      Component Value Date/Time   CALCIUM 9.1 03/24/2020 1218   CALCIUM 9.0 04/22/2017 1327   ALKPHOS 57 03/24/2020 1218   ALKPHOS 58 04/22/2017 1327   AST 14 (L) 03/24/2020 1218   AST 13 (L) 08/16/2019 1008   AST 13 04/22/2017 1327   ALT 11 03/24/2020 1218   ALT 14 08/16/2019 1008   ALT 11 04/22/2017 1327   BILITOT 0.5 03/24/2020 1218   BILITOT 0.5 08/16/2019 1008   BILITOT 0.38 04/22/2017 1327      STUDIES: No results found.    ASSESSMENT: 64 y.o. [place] woman   1. Status post left lumpectomy and sentinel lymph node sampling August 2012 for a T1cN0, stage IA invasive ductal carcinoma of overlapping sites, grade 3, estrogen receptor 98% and progesterone receptor 13% positive, HER-2/neu amplified with a ratio of 2.3.   2. Oncotype showed a recurrence score of 43 ( "high risk"), predicting a 29% risk of recurrence if the patient's only adjuvant  treatment was  tamoxifen   3. received adjuvant docetaxel/ cyclophosphamide x4, completed December 2012   4. trastuzumab started 04/15/2011, completed 05/05/2012; final echo 02/18/2012  5. radiation therapy, completed July 23, 2011  6. began on tamoxifen March 2013, but never taken consistently   RECURRENT DISEASE: November 2018 7.  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  Anastrozole started 04/22/2017, discontinued January 2020, poorly tolerated  (a) mammography at [facility] 03/20/2018 shows evidence of growth  (b) exemestane started January 2020, continued with significant interruptions  (c) bone density 09/27/2019 showed a T score of -1.0.  (d) exemestane discontinued April 2021 with multiple side effects  9.  Status post left mastectomy with sentinel lymph node sampling 03/01/2019 for a pT2 pN0, stage IB  invasive ductal carcinoma, grade 2, with negative margins.  The invasive tumor was estrogen and progesterone receptor positive, with an MIB-1 of 30%.  HER-2 was not amplified.  (a) immediate left latissimus reconstruction with saline implant placement  10.  Genetics testing 07/17/2018 through the Common Hereditary Cancers Panel offered by [facility] found no deleterious mutations in APC, ATM, AXIN2, BARD1, BMPR1A, BRCA1, BRCA2, BRIP1, CDH1, CDKN2A (p14ARF), CDKN2A (p16INK4a), CKD4, CHEK2, CTNNA1, DICER1, EPCAM (Deletion/duplication testing only), GREM1 (promoter region deletion/duplication testing only), KIT, MEN1, MLH1, MSH2, MSH3, MSH6, MUTYH, NBN, NF1, NHTL1, PALB2, PDGFRA, PMS2, POLD1, POLE, PTEN, RAD50, RAD51C, RAD51D, SDHB, SDHC, SDHD, SMAD4, SMARCA4. STK11, TP53, TSC1, TSC2, and VHL.  The following genes were evaluated for sequence changes only: SDHA and HOXB13 c.251G>A variant only  11.  Fulvestrant started  08/28/2019, repeated every 28 days--to be continued through December 2024  PLAN: Kriti is here today for f/u of her recurrent left sided breast cancer.  She continues on Fulvestrant injections given every 4 weeks.  She is tolerating these injections well.  She notes these are much easier than the side effects she was experiencing with tamoxifen.    We will get an ultrasound of the left breast to evaluate this skin change that she is noticing.  I have placed orders for it to be completed at [facility] per her request and have asked my nurse to fax them over.    Keneisha is doing a good job at taking care of herself.  I reviewed with her healthy diet, exercise, and keeping up with her health maintenance and PCP visits which she has been doing.    She will continue on Fulvestrant every 4 weeks.  She will see Dr. Jana Hakim in April or May of 2022.  She knows to call for any questions that may arise between now and her next appointment.  We are happy to see her sooner if needed.   Total encounter time: 30 minutes*  Wilber Bihari, NP 03/24/20 1:23 PM Medical Oncology and Hematology [address] [facility] [place], [place] [address] Tel. [phone]    Fax. [phone]   *Total Encounter Time as defined by the Centers for Medicare and Medicaid Services includes, in addition to the face-to-face time of a patient visit (documented in the note above) non-face-to-face time: obtaining and reviewing outside history, ordering and reviewing medications, tests or procedures, care coordination (communications with other health care professionals or caregivers) and documentation in the medical record.

## 2020-03-25 ENCOUNTER — Telehealth: Payer: Self-pay

## 2020-03-25 NOTE — Telephone Encounter (Signed)
Faxed order for ultrasound to [facility] at [phone] asking them to call patient to schedule for next available appt. Received confirmation.

## 2020-03-25 NOTE — Telephone Encounter (Signed)
-----   Message from Gardenia Phlegm, NP sent at 03/24/2020  1:16 PM EST ----- Patient needs ultrasound from [facility] next available.  Thanks.

## 2020-04-21 ENCOUNTER — Inpatient Hospital Stay: Payer: BC Managed Care – PPO | Attending: Oncology

## 2020-04-21 ENCOUNTER — Other Ambulatory Visit: Payer: Self-pay

## 2020-04-21 VITALS — BP 147/81 | HR 70 | Temp 98.4°F | Resp 18

## 2020-04-21 DIAGNOSIS — Z87891 Personal history of nicotine dependence: Secondary | ICD-10-CM | POA: Diagnosis not present

## 2020-04-21 DIAGNOSIS — Z5111 Encounter for antineoplastic chemotherapy: Secondary | ICD-10-CM | POA: Diagnosis present

## 2020-04-21 DIAGNOSIS — Z170 Estrogen receptor positive status [ER+]: Secondary | ICD-10-CM | POA: Insufficient documentation

## 2020-04-21 DIAGNOSIS — C50112 Malignant neoplasm of central portion of left female breast: Secondary | ICD-10-CM | POA: Diagnosis present

## 2020-04-21 DIAGNOSIS — Z9012 Acquired absence of left breast and nipple: Secondary | ICD-10-CM | POA: Insufficient documentation

## 2020-04-21 DIAGNOSIS — C50812 Malignant neoplasm of overlapping sites of left female breast: Secondary | ICD-10-CM

## 2020-04-21 MED ORDER — FULVESTRANT 250 MG/5ML IM SOLN
INTRAMUSCULAR | Status: AC
  Filled 2020-04-21: qty 10

## 2020-04-21 MED ORDER — FULVESTRANT 250 MG/5ML IM SOLN
500.0000 mg | Freq: Once | INTRAMUSCULAR | Status: AC
  Administered 2020-04-21: 500 mg via INTRAMUSCULAR

## 2020-04-21 NOTE — Patient Instructions (Signed)
Fulvestrant injection What is this medicine? FULVESTRANT (ful VES trant) blocks the effects of estrogen. It is used to treat breast cancer. This medicine may be used for other purposes; ask your health care provider or pharmacist if you have questions. COMMON BRAND NAME(S): FASLODEX What should I tell my health care provider before I take this medicine? They need to know if you have any of these conditions:  bleeding disorders  liver disease  low blood counts, like low white cell, platelet, or red cell counts  an unusual or allergic reaction to fulvestrant, other medicines, foods, dyes, or preservatives  pregnant or trying to get pregnant  breast-feeding How should I use this medicine? This medicine is for injection into a muscle. It is usually given by a health care professional in a hospital or clinic setting. Talk to your pediatrician regarding the use of this medicine in children. Special care may be needed. Overdosage: If you think you have taken too much of this medicine contact a poison control center or emergency room at once. NOTE: This medicine is only for you. Do not share this medicine with others. What if I miss a dose? It is important not to miss your dose. Call your doctor or health care professional if you are unable to keep an appointment. What may interact with this medicine?  medicines that treat or prevent blood clots like warfarin, enoxaparin, dalteparin, apixaban, dabigatran, and rivaroxaban This list may not describe all possible interactions. Give your health care provider a list of all the medicines, herbs, non-prescription drugs, or dietary supplements you use. Also tell them if you smoke, drink alcohol, or use illegal drugs. Some items may interact with your medicine. What should I watch for while using this medicine? Your condition will be monitored carefully while you are receiving this medicine. You will need important blood work done while you are taking  this medicine. Do not become pregnant while taking this medicine or for at least 1 year after stopping it. Women of child-bearing potential will need to have a negative pregnancy test before starting this medicine. Women should inform their doctor if they wish to become pregnant or think they might be pregnant. There is a potential for serious side effects to an unborn child. Men should inform their doctors if they wish to father a child. This medicine may lower sperm counts. Talk to your health care professional or pharmacist for more information. Do not breast-feed an infant while taking this medicine or for 1 year after the last dose. What side effects may I notice from receiving this medicine? Side effects that you should report to your doctor or health care professional as soon as possible:  allergic reactions like skin rash, itching or hives, swelling of the face, lips, or tongue  feeling faint or lightheaded, falls  pain, tingling, numbness, or weakness in the legs  signs and symptoms of infection like fever or chills; cough; flu-like symptoms; sore throat  vaginal bleeding Side effects that usually do not require medical attention (report to your doctor or health care professional if they continue or are bothersome):  aches, pains  constipation  diarrhea  headache  hot flashes  nausea, vomiting  pain at site where injected  stomach pain This list may not describe all possible side effects. Call your doctor for medical advice about side effects. You may report side effects to FDA at 1-800-FDA-1088. Where should I keep my medicine? This drug is given in a hospital or clinic and will   not be stored at home. NOTE: This sheet is a summary. It may not cover all possible information. If you have questions about this medicine, talk to your doctor, pharmacist, or health care provider.  2020 Elsevier/Gold Standard (2017-08-04 11:34:41)  

## 2020-05-21 ENCOUNTER — Inpatient Hospital Stay: Payer: BC Managed Care – PPO | Attending: Oncology

## 2020-05-21 ENCOUNTER — Other Ambulatory Visit: Payer: Self-pay

## 2020-05-21 VITALS — BP 140/78 | HR 68 | Resp 18

## 2020-05-21 DIAGNOSIS — Z170 Estrogen receptor positive status [ER+]: Secondary | ICD-10-CM | POA: Diagnosis not present

## 2020-05-21 DIAGNOSIS — C50112 Malignant neoplasm of central portion of left female breast: Secondary | ICD-10-CM | POA: Diagnosis present

## 2020-05-21 DIAGNOSIS — Z5111 Encounter for antineoplastic chemotherapy: Secondary | ICD-10-CM | POA: Diagnosis present

## 2020-05-21 DIAGNOSIS — Z79810 Long term (current) use of selective estrogen receptor modulators (SERMs): Secondary | ICD-10-CM | POA: Diagnosis not present

## 2020-05-21 DIAGNOSIS — C50812 Malignant neoplasm of overlapping sites of left female breast: Secondary | ICD-10-CM

## 2020-05-21 MED ORDER — FULVESTRANT 250 MG/5ML IM SOLN
INTRAMUSCULAR | Status: AC
  Filled 2020-05-21: qty 10

## 2020-05-21 MED ORDER — FULVESTRANT 250 MG/5ML IM SOLN
500.0000 mg | Freq: Once | INTRAMUSCULAR | Status: AC
  Administered 2020-05-21: 500 mg via INTRAMUSCULAR

## 2020-05-21 NOTE — Patient Instructions (Signed)
Fulvestrant injection What is this medicine? FULVESTRANT (ful VES trant) blocks the effects of estrogen. It is used to treat breast cancer. This medicine may be used for other purposes; ask your health care provider or pharmacist if you have questions. COMMON BRAND NAME(S): FASLODEX What should I tell my health care provider before I take this medicine? They need to know if you have any of these conditions:  bleeding disorders  liver disease  low blood counts, like low white cell, platelet, or red cell counts  an unusual or allergic reaction to fulvestrant, other medicines, foods, dyes, or preservatives  pregnant or trying to get pregnant  breast-feeding How should I use this medicine? This medicine is for injection into a muscle. It is usually given by a health care professional in a hospital or clinic setting. Talk to your pediatrician regarding the use of this medicine in children. Special care may be needed. Overdosage: If you think you have taken too much of this medicine contact a poison control center or emergency room at once. NOTE: This medicine is only for you. Do not share this medicine with others. What if I miss a dose? It is important not to miss your dose. Call your doctor or health care professional if you are unable to keep an appointment. What may interact with this medicine?  medicines that treat or prevent blood clots like warfarin, enoxaparin, dalteparin, apixaban, dabigatran, and rivaroxaban This list may not describe all possible interactions. Give your health care provider a list of all the medicines, herbs, non-prescription drugs, or dietary supplements you use. Also tell them if you smoke, drink alcohol, or use illegal drugs. Some items may interact with your medicine. What should I watch for while using this medicine? Your condition will be monitored carefully while you are receiving this medicine. You will need important blood work done while you are taking  this medicine. Do not become pregnant while taking this medicine or for at least 1 year after stopping it. Women of child-bearing potential will need to have a negative pregnancy test before starting this medicine. Women should inform their doctor if they wish to become pregnant or think they might be pregnant. There is a potential for serious side effects to an unborn child. Men should inform their doctors if they wish to father a child. This medicine may lower sperm counts. Talk to your health care professional or pharmacist for more information. Do not breast-feed an infant while taking this medicine or for 1 year after the last dose. What side effects may I notice from receiving this medicine? Side effects that you should report to your doctor or health care professional as soon as possible:  allergic reactions like skin rash, itching or hives, swelling of the face, lips, or tongue  feeling faint or lightheaded, falls  pain, tingling, numbness, or weakness in the legs  signs and symptoms of infection like fever or chills; cough; flu-like symptoms; sore throat  vaginal bleeding Side effects that usually do not require medical attention (report to your doctor or health care professional if they continue or are bothersome):  aches, pains  constipation  diarrhea  headache  hot flashes  nausea, vomiting  pain at site where injected  stomach pain This list may not describe all possible side effects. Call your doctor for medical advice about side effects. You may report side effects to FDA at 1-800-FDA-1088. Where should I keep my medicine? This drug is given in a hospital or clinic and will   not be stored at home. NOTE: This sheet is a summary. It may not cover all possible information. If you have questions about this medicine, talk to your doctor, pharmacist, or health care provider.  2021 Elsevier/Gold Standard (2017-08-04 11:34:41)  

## 2020-06-18 ENCOUNTER — Other Ambulatory Visit: Payer: Self-pay

## 2020-06-18 ENCOUNTER — Inpatient Hospital Stay: Payer: BC Managed Care – PPO | Attending: Oncology

## 2020-06-18 VITALS — BP 157/76 | HR 76 | Temp 98.1°F | Resp 18

## 2020-06-18 DIAGNOSIS — Z5111 Encounter for antineoplastic chemotherapy: Secondary | ICD-10-CM | POA: Diagnosis present

## 2020-06-18 DIAGNOSIS — Z79810 Long term (current) use of selective estrogen receptor modulators (SERMs): Secondary | ICD-10-CM | POA: Diagnosis not present

## 2020-06-18 DIAGNOSIS — C50112 Malignant neoplasm of central portion of left female breast: Secondary | ICD-10-CM | POA: Insufficient documentation

## 2020-06-18 DIAGNOSIS — C50812 Malignant neoplasm of overlapping sites of left female breast: Secondary | ICD-10-CM

## 2020-06-18 DIAGNOSIS — Z170 Estrogen receptor positive status [ER+]: Secondary | ICD-10-CM | POA: Diagnosis not present

## 2020-06-18 MED ORDER — FULVESTRANT 250 MG/5ML IM SOLN
500.0000 mg | Freq: Once | INTRAMUSCULAR | Status: AC
  Administered 2020-06-18: 500 mg via INTRAMUSCULAR

## 2020-06-18 MED ORDER — FULVESTRANT 250 MG/5ML IM SOLN
INTRAMUSCULAR | Status: AC
  Filled 2020-06-18: qty 10

## 2020-06-18 NOTE — Patient Instructions (Signed)
Fulvestrant injection What is this medicine? FULVESTRANT (ful VES trant) blocks the effects of estrogen. It is used to treat breast cancer. This medicine may be used for other purposes; ask your health care provider or pharmacist if you have questions. COMMON BRAND NAME(S): FASLODEX What should I tell my health care provider before I take this medicine? They need to know if you have any of these conditions:  bleeding disorders  liver disease  low blood counts, like low white cell, platelet, or red cell counts  an unusual or allergic reaction to fulvestrant, other medicines, foods, dyes, or preservatives  pregnant or trying to get pregnant  breast-feeding How should I use this medicine? This medicine is for injection into a muscle. It is usually given by a health care professional in a hospital or clinic setting. Talk to your pediatrician regarding the use of this medicine in children. Special care may be needed. Overdosage: If you think you have taken too much of this medicine contact a poison control center or emergency room at once. NOTE: This medicine is only for you. Do not share this medicine with others. What if I miss a dose? It is important not to miss your dose. Call your doctor or health care professional if you are unable to keep an appointment. What may interact with this medicine?  medicines that treat or prevent blood clots like warfarin, enoxaparin, dalteparin, apixaban, dabigatran, and rivaroxaban This list may not describe all possible interactions. Give your health care provider a list of all the medicines, herbs, non-prescription drugs, or dietary supplements you use. Also tell them if you smoke, drink alcohol, or use illegal drugs. Some items may interact with your medicine. What should I watch for while using this medicine? Your condition will be monitored carefully while you are receiving this medicine. You will need important blood work done while you are taking  this medicine. Do not become pregnant while taking this medicine or for at least 1 year after stopping it. Women of child-bearing potential will need to have a negative pregnancy test before starting this medicine. Women should inform their doctor if they wish to become pregnant or think they might be pregnant. There is a potential for serious side effects to an unborn child. Men should inform their doctors if they wish to father a child. This medicine may lower sperm counts. Talk to your health care professional or pharmacist for more information. Do not breast-feed an infant while taking this medicine or for 1 year after the last dose. What side effects may I notice from receiving this medicine? Side effects that you should report to your doctor or health care professional as soon as possible:  allergic reactions like skin rash, itching or hives, swelling of the face, lips, or tongue  feeling faint or lightheaded, falls  pain, tingling, numbness, or weakness in the legs  signs and symptoms of infection like fever or chills; cough; flu-like symptoms; sore throat  vaginal bleeding Side effects that usually do not require medical attention (report to your doctor or health care professional if they continue or are bothersome):  aches, pains  constipation  diarrhea  headache  hot flashes  nausea, vomiting  pain at site where injected  stomach pain This list may not describe all possible side effects. Call your doctor for medical advice about side effects. You may report side effects to FDA at 1-800-FDA-1088. Where should I keep my medicine? This drug is given in a hospital or clinic and will   not be stored at home. NOTE: This sheet is a summary. It may not cover all possible information. If you have questions about this medicine, talk to your doctor, pharmacist, or health care provider.  2021 Elsevier/Gold Standard (2017-08-04 11:34:41)  

## 2020-07-16 ENCOUNTER — Other Ambulatory Visit: Payer: Self-pay

## 2020-07-16 ENCOUNTER — Inpatient Hospital Stay: Payer: BC Managed Care – PPO | Attending: Oncology

## 2020-07-16 VITALS — BP 137/70 | HR 86 | Temp 98.2°F | Resp 20

## 2020-07-16 DIAGNOSIS — Z5111 Encounter for antineoplastic chemotherapy: Secondary | ICD-10-CM | POA: Diagnosis present

## 2020-07-16 DIAGNOSIS — C50812 Malignant neoplasm of overlapping sites of left female breast: Secondary | ICD-10-CM

## 2020-07-16 DIAGNOSIS — Z170 Estrogen receptor positive status [ER+]: Secondary | ICD-10-CM | POA: Insufficient documentation

## 2020-07-16 DIAGNOSIS — C50112 Malignant neoplasm of central portion of left female breast: Secondary | ICD-10-CM | POA: Insufficient documentation

## 2020-07-16 MED ORDER — FULVESTRANT 250 MG/5ML IM SOLN
500.0000 mg | Freq: Once | INTRAMUSCULAR | Status: AC
  Administered 2020-07-16: 500 mg via INTRAMUSCULAR

## 2020-07-16 MED ORDER — FULVESTRANT 250 MG/5ML IM SOLN
INTRAMUSCULAR | Status: AC
  Filled 2020-07-16: qty 10

## 2020-07-16 NOTE — Patient Instructions (Signed)
Fulvestrant injection What is this medicine? FULVESTRANT (ful VES trant) blocks the effects of estrogen. It is used to treat breast cancer. This medicine may be used for other purposes; ask your health care provider or pharmacist if you have questions. COMMON BRAND NAME(S): FASLODEX What should I tell my health care provider before I take this medicine? They need to know if you have any of these conditions:  bleeding disorders  liver disease  low blood counts, like low white cell, platelet, or red cell counts  an unusual or allergic reaction to fulvestrant, other medicines, foods, dyes, or preservatives  pregnant or trying to get pregnant  breast-feeding How should I use this medicine? This medicine is for injection into a muscle. It is usually given by a health care professional in a hospital or clinic setting. Talk to your pediatrician regarding the use of this medicine in children. Special care may be needed. Overdosage: If you think you have taken too much of this medicine contact a poison control center or emergency room at once. NOTE: This medicine is only for you. Do not share this medicine with others. What if I miss a dose? It is important not to miss your dose. Call your doctor or health care professional if you are unable to keep an appointment. What may interact with this medicine?  medicines that treat or prevent blood clots like warfarin, enoxaparin, dalteparin, apixaban, dabigatran, and rivaroxaban This list may not describe all possible interactions. Give your health care provider a list of all the medicines, herbs, non-prescription drugs, or dietary supplements you use. Also tell them if you smoke, drink alcohol, or use illegal drugs. Some items may interact with your medicine. What should I watch for while using this medicine? Your condition will be monitored carefully while you are receiving this medicine. You will need important blood work done while you are taking  this medicine. Do not become pregnant while taking this medicine or for at least 1 year after stopping it. Women of child-bearing potential will need to have a negative pregnancy test before starting this medicine. Women should inform their doctor if they wish to become pregnant or think they might be pregnant. There is a potential for serious side effects to an unborn child. Men should inform their doctors if they wish to father a child. This medicine may lower sperm counts. Talk to your health care professional or pharmacist for more information. Do not breast-feed an infant while taking this medicine or for 1 year after the last dose. What side effects may I notice from receiving this medicine? Side effects that you should report to your doctor or health care professional as soon as possible:  allergic reactions like skin rash, itching or hives, swelling of the face, lips, or tongue  feeling faint or lightheaded, falls  pain, tingling, numbness, or weakness in the legs  signs and symptoms of infection like fever or chills; cough; flu-like symptoms; sore throat  vaginal bleeding Side effects that usually do not require medical attention (report to your doctor or health care professional if they continue or are bothersome):  aches, pains  constipation  diarrhea  headache  hot flashes  nausea, vomiting  pain at site where injected  stomach pain This list may not describe all possible side effects. Call your doctor for medical advice about side effects. You may report side effects to FDA at 1-800-FDA-1088. Where should I keep my medicine? This drug is given in a hospital or clinic and will   not be stored at home. NOTE: This sheet is a summary. It may not cover all possible information. If you have questions about this medicine, talk to your doctor, pharmacist, or health care provider.  2021 Elsevier/Gold Standard (2017-08-04 11:34:41)  

## 2020-07-17 ENCOUNTER — Encounter: Payer: Self-pay | Admitting: Oncology

## 2020-07-18 ENCOUNTER — Encounter: Payer: Self-pay | Admitting: Oncology

## 2020-08-13 ENCOUNTER — Inpatient Hospital Stay: Payer: BC Managed Care – PPO | Attending: Oncology

## 2020-08-13 ENCOUNTER — Other Ambulatory Visit: Payer: Self-pay

## 2020-08-13 VITALS — BP 143/83 | HR 65 | Temp 98.6°F | Resp 18

## 2020-08-13 DIAGNOSIS — Z170 Estrogen receptor positive status [ER+]: Secondary | ICD-10-CM | POA: Insufficient documentation

## 2020-08-13 DIAGNOSIS — C50812 Malignant neoplasm of overlapping sites of left female breast: Secondary | ICD-10-CM | POA: Insufficient documentation

## 2020-08-13 DIAGNOSIS — Z5111 Encounter for antineoplastic chemotherapy: Secondary | ICD-10-CM | POA: Insufficient documentation

## 2020-08-13 MED ORDER — FULVESTRANT 250 MG/5ML IM SOLN
500.0000 mg | Freq: Once | INTRAMUSCULAR | Status: AC
  Administered 2020-08-13: 500 mg via INTRAMUSCULAR

## 2020-08-13 MED ORDER — FULVESTRANT 250 MG/5ML IM SOLN
INTRAMUSCULAR | Status: AC
  Filled 2020-08-13: qty 10

## 2020-08-13 NOTE — Patient Instructions (Signed)
Fulvestrant injection What is this medicine? FULVESTRANT (ful VES trant) blocks the effects of estrogen. It is used to treat breast cancer. This medicine may be used for other purposes; ask your health care provider or pharmacist if you have questions. COMMON BRAND NAME(S): FASLODEX What should I tell my health care provider before I take this medicine? They need to know if you have any of these conditions:  bleeding disorders  liver disease  low blood counts, like low white cell, platelet, or red cell counts  an unusual or allergic reaction to fulvestrant, other medicines, foods, dyes, or preservatives  pregnant or trying to get pregnant  breast-feeding How should I use this medicine? This medicine is for injection into a muscle. It is usually given by a health care professional in a hospital or clinic setting. Talk to your pediatrician regarding the use of this medicine in children. Special care may be needed. Overdosage: If you think you have taken too much of this medicine contact a poison control center or emergency room at once. NOTE: This medicine is only for you. Do not share this medicine with others. What if I miss a dose? It is important not to miss your dose. Call your doctor or health care professional if you are unable to keep an appointment. What may interact with this medicine?  medicines that treat or prevent blood clots like warfarin, enoxaparin, dalteparin, apixaban, dabigatran, and rivaroxaban This list may not describe all possible interactions. Give your health care provider a list of all the medicines, herbs, non-prescription drugs, or dietary supplements you use. Also tell them if you smoke, drink alcohol, or use illegal drugs. Some items may interact with your medicine. What should I watch for while using this medicine? Your condition will be monitored carefully while you are receiving this medicine. You will need important blood work done while you are taking  this medicine. Do not become pregnant while taking this medicine or for at least 1 year after stopping it. Women of child-bearing potential will need to have a negative pregnancy test before starting this medicine. Women should inform their doctor if they wish to become pregnant or think they might be pregnant. There is a potential for serious side effects to an unborn child. Men should inform their doctors if they wish to father a child. This medicine may lower sperm counts. Talk to your health care professional or pharmacist for more information. Do not breast-feed an infant while taking this medicine or for 1 year after the last dose. What side effects may I notice from receiving this medicine? Side effects that you should report to your doctor or health care professional as soon as possible:  allergic reactions like skin rash, itching or hives, swelling of the face, lips, or tongue  feeling faint or lightheaded, falls  pain, tingling, numbness, or weakness in the legs  signs and symptoms of infection like fever or chills; cough; flu-like symptoms; sore throat  vaginal bleeding Side effects that usually do not require medical attention (report to your doctor or health care professional if they continue or are bothersome):  aches, pains  constipation  diarrhea  headache  hot flashes  nausea, vomiting  pain at site where injected  stomach pain This list may not describe all possible side effects. Call your doctor for medical advice about side effects. You may report side effects to FDA at 1-800-FDA-1088. Where should I keep my medicine? This drug is given in a hospital or clinic and will   not be stored at home. NOTE: This sheet is a summary. It may not cover all possible information. If you have questions about this medicine, talk to your doctor, pharmacist, or health care provider.  2021 Elsevier/Gold Standard (2017-08-04 11:34:41)  

## 2020-09-10 ENCOUNTER — Inpatient Hospital Stay: Payer: BC Managed Care – PPO

## 2020-09-10 ENCOUNTER — Inpatient Hospital Stay: Payer: BC Managed Care – PPO | Admitting: Oncology

## 2020-09-10 ENCOUNTER — Inpatient Hospital Stay: Payer: BC Managed Care – PPO | Attending: Oncology

## 2020-09-10 ENCOUNTER — Other Ambulatory Visit: Payer: Self-pay

## 2020-09-10 VITALS — BP 102/92 | HR 74 | Temp 97.2°F | Resp 18 | Ht 60.0 in | Wt 225.9 lb

## 2020-09-10 DIAGNOSIS — C50812 Malignant neoplasm of overlapping sites of left female breast: Secondary | ICD-10-CM

## 2020-09-10 DIAGNOSIS — C50919 Malignant neoplasm of unspecified site of unspecified female breast: Secondary | ICD-10-CM | POA: Diagnosis not present

## 2020-09-10 DIAGNOSIS — Z87891 Personal history of nicotine dependence: Secondary | ICD-10-CM | POA: Insufficient documentation

## 2020-09-10 DIAGNOSIS — Z170 Estrogen receptor positive status [ER+]: Secondary | ICD-10-CM

## 2020-09-10 DIAGNOSIS — Z1379 Encounter for other screening for genetic and chromosomal anomalies: Secondary | ICD-10-CM

## 2020-09-10 DIAGNOSIS — Z923 Personal history of irradiation: Secondary | ICD-10-CM | POA: Insufficient documentation

## 2020-09-10 DIAGNOSIS — Z79899 Other long term (current) drug therapy: Secondary | ICD-10-CM | POA: Diagnosis not present

## 2020-09-10 DIAGNOSIS — I10 Essential (primary) hypertension: Secondary | ICD-10-CM

## 2020-09-10 DIAGNOSIS — Z5111 Encounter for antineoplastic chemotherapy: Secondary | ICD-10-CM | POA: Diagnosis present

## 2020-09-10 DIAGNOSIS — Z79810 Long term (current) use of selective estrogen receptor modulators (SERMs): Secondary | ICD-10-CM | POA: Insufficient documentation

## 2020-09-10 DIAGNOSIS — Z9012 Acquired absence of left breast and nipple: Secondary | ICD-10-CM | POA: Diagnosis not present

## 2020-09-10 DIAGNOSIS — D0512 Intraductal carcinoma in situ of left breast: Secondary | ICD-10-CM

## 2020-09-10 DIAGNOSIS — C50912 Malignant neoplasm of unspecified site of left female breast: Secondary | ICD-10-CM

## 2020-09-10 LAB — CBC WITH DIFFERENTIAL/PLATELET
Abs Immature Granulocytes: 0.01 10*3/uL (ref 0.00–0.07)
Basophils Absolute: 0 10*3/uL (ref 0.0–0.1)
Basophils Relative: 0 %
Eosinophils Absolute: 0 10*3/uL (ref 0.0–0.5)
Eosinophils Relative: 1 %
HCT: 37.4 % (ref 36.0–46.0)
Hemoglobin: 12.3 g/dL (ref 12.0–15.0)
Immature Granulocytes: 0 %
Lymphocytes Relative: 32 %
Lymphs Abs: 1.5 10*3/uL (ref 0.7–4.0)
MCH: 27 pg (ref 26.0–34.0)
MCHC: 32.9 g/dL (ref 30.0–36.0)
MCV: 82.2 fL (ref 80.0–100.0)
Monocytes Absolute: 0.4 10*3/uL (ref 0.1–1.0)
Monocytes Relative: 8 %
Neutro Abs: 2.8 10*3/uL (ref 1.7–7.7)
Neutrophils Relative %: 59 %
Platelets: 289 10*3/uL (ref 150–400)
RBC: 4.55 MIL/uL (ref 3.87–5.11)
RDW: 14.4 % (ref 11.5–15.5)
WBC: 4.7 10*3/uL (ref 4.0–10.5)
nRBC: 0 % (ref 0.0–0.2)

## 2020-09-10 LAB — COMPREHENSIVE METABOLIC PANEL
ALT: 11 U/L (ref 0–44)
AST: 14 U/L — ABNORMAL LOW (ref 15–41)
Albumin: 3.8 g/dL (ref 3.5–5.0)
Alkaline Phosphatase: 59 U/L (ref 38–126)
Anion gap: 11 (ref 5–15)
BUN: 11 mg/dL (ref 8–23)
CO2: 26 mmol/L (ref 22–32)
Calcium: 9.1 mg/dL (ref 8.9–10.3)
Chloride: 105 mmol/L (ref 98–111)
Creatinine, Ser: 0.81 mg/dL (ref 0.44–1.00)
GFR, Estimated: >60 mL/min (ref >60)
Glucose, Bld: 79 mg/dL (ref 70–99)
Potassium: 3.5 mmol/L (ref 3.5–5.1)
Sodium: 142 mmol/L (ref 135–145)
Total Bilirubin: 0.4 mg/dL (ref 0.3–1.2)
Total Protein: 7.3 g/dL (ref 6.5–8.1)

## 2020-09-10 MED ORDER — FULVESTRANT 250 MG/5ML IM SOLN
INTRAMUSCULAR | Status: AC
  Filled 2020-09-10: qty 10

## 2020-09-10 MED ORDER — FULVESTRANT 250 MG/5ML IM SOLN
500.0000 mg | Freq: Once | INTRAMUSCULAR | Status: AC
  Administered 2020-09-10: 500 mg via INTRAMUSCULAR

## 2020-09-10 NOTE — Patient Instructions (Signed)
Fulvestrant injection What is this medicine? FULVESTRANT (ful VES trant) blocks the effects of estrogen. It is used to treat breast cancer. This medicine may be used for other purposes; ask your health care provider or pharmacist if you have questions. COMMON BRAND NAME(S): FASLODEX What should I tell my health care provider before I take this medicine? They need to know if you have any of these conditions:  bleeding disorders  liver disease  low blood counts, like low white cell, platelet, or red cell counts  an unusual or allergic reaction to fulvestrant, other medicines, foods, dyes, or preservatives  pregnant or trying to get pregnant  breast-feeding How should I use this medicine? This medicine is for injection into a muscle. It is usually given by a health care professional in a hospital or clinic setting. Talk to your pediatrician regarding the use of this medicine in children. Special care may be needed. Overdosage: If you think you have taken too much of this medicine contact a poison control center or emergency room at once. NOTE: This medicine is only for you. Do not share this medicine with others. What if I miss a dose? It is important not to miss your dose. Call your doctor or health care professional if you are unable to keep an appointment. What may interact with this medicine?  medicines that treat or prevent blood clots like warfarin, enoxaparin, dalteparin, apixaban, dabigatran, and rivaroxaban This list may not describe all possible interactions. Give your health care provider a list of all the medicines, herbs, non-prescription drugs, or dietary supplements you use. Also tell them if you smoke, drink alcohol, or use illegal drugs. Some items may interact with your medicine. What should I watch for while using this medicine? Your condition will be monitored carefully while you are receiving this medicine. You will need important blood work done while you are taking  this medicine. Do not become pregnant while taking this medicine or for at least 1 year after stopping it. Women of child-bearing potential will need to have a negative pregnancy test before starting this medicine. Women should inform their doctor if they wish to become pregnant or think they might be pregnant. There is a potential for serious side effects to an unborn child. Men should inform their doctors if they wish to father a child. This medicine may lower sperm counts. Talk to your health care professional or pharmacist for more information. Do not breast-feed an infant while taking this medicine or for 1 year after the last dose. What side effects may I notice from receiving this medicine? Side effects that you should report to your doctor or health care professional as soon as possible:  allergic reactions like skin rash, itching or hives, swelling of the face, lips, or tongue  feeling faint or lightheaded, falls  pain, tingling, numbness, or weakness in the legs  signs and symptoms of infection like fever or chills; cough; flu-like symptoms; sore throat  vaginal bleeding Side effects that usually do not require medical attention (report to your doctor or health care professional if they continue or are bothersome):  aches, pains  constipation  diarrhea  headache  hot flashes  nausea, vomiting  pain at site where injected  stomach pain This list may not describe all possible side effects. Call your doctor for medical advice about side effects. You may report side effects to FDA at 1-800-FDA-1088. Where should I keep my medicine? This drug is given in a hospital or clinic and will   not be stored at home. NOTE: This sheet is a summary. It may not cover all possible information. If you have questions about this medicine, talk to your doctor, pharmacist, or health care provider.  2021 Elsevier/Gold Standard (2017-08-04 11:34:41)  

## 2020-09-10 NOTE — Progress Notes (Signed)
[place]  Telephone:([phone]) [phone] Fax:(336) [phone]   ID: [name]   DOB: Sep 22, 1955  MR#: [id number]  AOZ#:[id number]  Patient Care Team: Ginger Organ., MD as PCP - General (Internal Medicine) Elkin Belfield, Virgie Dad, MD as Attending Physician (Hematology and Oncology) Arloa Koh, MD (Inactive) as Attending Physician (Radiation Oncology) Alphonsa Overall, MD as Consulting Physician (General Surgery) Crissie Reese, MD as Consulting Physician (Plastic Surgery)  OTHER MD: [name], MD (GYN)   CHIEF COMPLAINT:  Left Breast Cancer (s/p left mastectomy)  CURRENT TREATMENT: fulvestrant   INTERVAL HISTORY: [name] returns today for follow-up of her recurrent left breast cancer   She continues on Fulvestrant every 4 weeks with good tolerance.  She does not feel soreness or stinging from the injections and has really had no complications from the treatment that she is aware of  Since her last visit, she presented with a left breast "bubble" at 6 o'clock. She underwent left breast ultrasound on 04/08/2020 showing: 1.4 cm left breast mass consistent with fat necrosis.  She is scheduled for routine right mammography at [facility] 10/02/2020.  REVIEW OF SYSTEMS: Shakila has a pulse in her left ear at times and a little bit of swelling around the salivary gland there she says.  She says she has brought this up to her primary care physician and he has evaluated it.  She is worried that her heart rate can be up to about 98 although in the morning when she is relaxed its about 72.  Right breast can be achy sometimes in the nipple can be sensitive.  She is doing some chair yoga and tai chi exercises.  Her left knee sometimes pops.  I assured her that none of these issues have anything to do with the fulvestrant or her breast cancer.  A detailed review of systems today was otherwise stable   COVID 19 VACCINATION STATUS: [place] x2, most recently 08/2019   HISTORY OF PRESENT  ILLNESS: [name] was referred by [name] for treatment of left breast cancer.   The patient underwent screening bilateral mammography July 04, 2009, at [facility] and this showed only a scattered fibroglandular densities. However, repeat screening exam Sep 29, 2010 showed a potential abnormality in the left breast, retroareolar region. The patient was recalled for additional views Oct 08, 2010 and Dr. [name] was able to locate the nodular density noted on the screening exam. It measured approximately 8 mm. Ultrasound showed a second nodular density just lateral to the nipple areolar complex. In that area, there was what appears to be either a small cluster of cysts or perhaps a solid nodule. In particular, the nodule seen in the 3 o'clock position was felt to be most likely a fibroadenoma. The other area, however, required biopsy and this was performed October 26, 2010. The pathology from this procedure ([id number]) showed a high-grade invasive ductal carcinoma which was estrogen-receptor positive at 98%, progesterone-receptor positive at 13% with an elevated MIB-1 at 69% and no evidence of HER-2 amplification, the ratio by CISH being 1.32.   With this information the patient was referred for bilateral breast MRIs and these were performed October 30, 2010. In the left breast there was a 1.8 cm ill-defined area in the central breast associated with post biopsy changes. There were really no other suspicious areas in either breast and no bony abnormalities and no abnormal appearing or enlarged lymph nodes.   Patient underwent left lumpectomy and sentinel lymph node sampling December 10, 2010 (  [phone]) for a 1.1-cm invasive ductal carcinoma, grade 3, with 0/2 sentinel lymph nodes involved and so T1c N0 (stage I); the tumor being estrogen receptor 98% and progesterone receptor 13% positive, with no HER-2 amplification (by initial determination, but amplified on repeat with a ratio of 2.30); with an Oncotype DX  score of 43 predicting a 29% risk of recurrence if she takes 5 years of tamoxifen. (Her-2 was negative on the Oncotype DX determination, which uses a completely different method).   Her subsequent history is as detailed below.   PAST MEDICAL HISTORY: Past Medical History:  Diagnosis Date  . BRCA negative   . Breast cancer ([place]) 2018   stage I high grade invasive ductal ca left breast  . Breast lump    left  . Cancer ([place]) 2012   lumpectomy--Lt.Br.  . Family history of breast cancer 06/23/2018  . Family history of prostate cancer 06/23/2018  . Gum disease   . Hyperlipidemia    diet and exercise controlled, no med  . Hypertension   . Pre-diabetes    borderline - diet and exercise contolled, no meds  . Sleep apnea    uses CPAP nightly  . Urinary incontinence     PAST SURGICAL HISTORY: Past Surgical History:  Procedure Laterality Date  . BREAST LUMPECTOMY  2012   left  . COLONOSCOPY    . FOOT MASS EXCISION    . LAPAROSCOPIC GASTRIC BANDING    . LATISSIMUS FLAP TO BREAST Left 03/01/2019   Procedure: LEFT BREAST LATIISSIMUS FLAP;  Surgeon: Crissie Reese, MD;  Location: [place];  Service: Plastics;  Laterality: Left;  [name] MASS EXCISION     back  . MASTECTOMY W/ SENTINEL NODE BIOPSY Left 03/01/2019   Procedure: LEFT MASTECTOMY WITH LEFT SENTINEL LYMPH NODE BIOPSY;  Surgeon: Alphonsa Overall, MD;  Location: [place];  Service: General;  Laterality: Left;  . PLACEMENT OF BREAST IMPLANTS Left 03/01/2019   Procedure: PLACEMENT OF SALINE BREAST IMPLANT;  Surgeon: Crissie Reese, MD;  Location: [place];  Service: Plastics;  Laterality: Left;  . PORTACATH PLACEMENT  02/11/11  . PORTACATH REMOVAL  MARCH 2014  . RESECTOSCOPIC POLYPECTOMY  2001   MYOMECTOMY  . SOFT TISSUE CYST EXCISION     back cyst  . VAGINAL MASS EXCISION      FAMILY HISTORY Family History  Problem Relation Age of Onset  . Cancer Maternal Uncle 57       prostate, metastatic  . Alcohol abuse Father   . Cirrhosis Father   .  Breast cancer Mother 41       Stage 1  . Hypertension Mother     GYNECOLOGIC HISTORY: ((Updated 01/26/2013) She had menarche at age 70. She is GX P0.  Last menstrual period was late May 2012.  She has never used hormone replacement.     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  She is divorced and lives by herself. She attends a [employer]. She used to go to the gym at least three times a week prior to the pandemic and she is still doing some chair exercises and tai chi now.    ADVANCED DIRECTIVES: Not in place   HEALTH MAINTENANCE: Social History   Tobacco Use  . Smoking status: Former Smoker    Packs/day: 1.00    Years: 21.00    Pack years: 21.00    Types: Cigarettes    Quit date: 03/25/1999    Years since  quitting: 21.4  . Smokeless tobacco: Never Used  Vaping Use  . Vaping Use: Never used  Substance Use Topics  . Alcohol use: Yes    Alcohol/week: 7.0 standard drinks    Types: 7 Glasses of wine per week    Comment: red wine  . Drug use: No     Colonoscopy:   PAP:   Bone density: 09/2019, -1.0  Lipid panel:    No Known Allergies  Current Outpatient Medications  Medication Sig Dispense Refill  . amLODipine-olmesartan (AZOR) 10-40 MG per tablet Take 1 tablet by mouth daily. (Patient taking differently: Take 1 tablet by mouth at bedtime. ) 30 tablet 6  . calcium carbonate (OS-CAL) 1250 (500 Ca) MG chewable tablet Chew by mouth.    . cyanocobalamin 1000 MCG tablet Take 1 tablet by mouth as needed.    [name] ELDERBERRY PO Take 1 tablet by mouth as needed.    . Multiple Vitamin (MULTIVITAMIN) capsule Take 1 capsule by mouth daily.    [name] triamterene-hydrochlorothiazide (DYAZIDE) 37.5-25 MG capsule Take 1 each (1 capsule total) by mouth daily.     No current facility-administered medications for this visit.    OBJECTIVE: African-American woman who appears stated age 84:   09/10/20 1233  BP: (!) 102/92   Pulse: 74  Resp: 18  Temp: (!) 97.2 F (36.2 C)     Body mass index is 44.12 kg/m.    ECOG FS: 1 Filed Weights   09/10/20 1233  Weight: 225 lb 14.4 oz (102.5 kg)     Sclerae unicteric, EOMs intact Left outer ear canal is minimally irritated, the tympanic membrane is intact with normal light reflex. No cervical or supraclavicular adenopathy Lungs no rales or rhonchi Heart regular rate and rhythm Abd soft, nontender, positive bowel sounds MSK no focal spinal tenderness, no upper extremity lymphedema Neuro: nonfocal, well oriented, appropriate affect Breasts: The right breast is unremarkable.  The left breast is status post mastectomy and reconstruction.  There is no evidence of local recurrence.  Both axillae are benign.   LAB RESULTS: Lab Results  Component Value Date   WBC 4.7 09/10/2020   NEUTROABS 2.8 09/10/2020   HGB 12.3 09/10/2020   HCT 37.4 09/10/2020   MCV 82.2 09/10/2020   PLT 289 09/10/2020      Chemistry      Component Value Date/Time   NA 142 09/10/2020 1213   NA 140 04/22/2017 1327   K 3.5 09/10/2020 1213   K 4.0 04/22/2017 1327   CL 105 09/10/2020 1213   CL 105 09/08/2012 1320   CO2 26 09/10/2020 1213   CO2 25 04/22/2017 1327   BUN 11 09/10/2020 1213   BUN 15.0 04/22/2017 1327   CREATININE 0.81 09/10/2020 1213   CREATININE 0.83 08/16/2019 1008   CREATININE 0.8 04/22/2017 1327      Component Value Date/Time   CALCIUM 9.1 09/10/2020 1213   CALCIUM 9.0 04/22/2017 1327   ALKPHOS 59 09/10/2020 1213   ALKPHOS 58 04/22/2017 1327   AST 14 (L) 09/10/2020 1213   AST 13 (L) 08/16/2019 1008   AST 13 04/22/2017 1327   ALT 11 09/10/2020 1213   ALT 14 08/16/2019 1008   ALT 11 04/22/2017 1327   BILITOT 0.4 09/10/2020 1213   BILITOT 0.5 08/16/2019 1008   BILITOT 0.38 04/22/2017 1327      STUDIES: No results found.    ASSESSMENT: 65 y.o. [place] woman   1. Status post left lumpectomy and sentinel lymph node sampling  August 2012 for a T1cN0,  stage IA invasive ductal carcinoma of overlapping sites, grade 3, estrogen receptor 98% and progesterone receptor 13% positive, HER-2/neu amplified with a ratio of 2.3.   2. Oncotype showed a recurrence score of 43 ( "high risk"), predicting a 29% risk of recurrence if the patient's only adjuvant treatment was tamoxifen   3. received adjuvant docetaxel/ cyclophosphamide x4, completed December 2012   4. trastuzumab started 04/15/2011, completed 05/05/2012; final echo 02/18/2012  5. radiation therapy, completed July 23, 2011  6. began on tamoxifen March 2013, but never taken consistently   RECURRENT DISEASE: November 2018 7.  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  Anastrozole started 04/22/2017, discontinued January 2020, poorly tolerated  (a) mammography [facility] 03/20/2018 shows evidence of growth  (b) exemestane started January 2020, continued with significant interruptions  (c) bone density 09/27/2019 showed a T score of -1.0.  (d) exemestane discontinued April 2021 with multiple side effects  9.  Status post left mastectomy with sentinel lymph node sampling 03/01/2019 for a pT2 pN0, stage IB  invasive ductal carcinoma, grade 2, with negative margins.  The invasive tumor was estrogen and progesterone receptor positive, with an MIB-1 of 30%.  HER-2 was not amplified.  (a) immediate left latissimus reconstruction with saline implant placement  10.  Genetics testing 07/17/2018 through the Common Hereditary Cancers Panel offered by [facility] found no deleterious mutations in APC, ATM, AXIN2, BARD1, BMPR1A, BRCA1, BRCA2, BRIP1, CDH1, CDKN2A (p14ARF), CDKN2A (p16INK4a), CKD4, CHEK2, CTNNA1, DICER1, EPCAM (Deletion/duplication testing only), GREM1 (promoter region deletion/duplication testing only), KIT,  MEN1, MLH1, MSH2, MSH3, MSH6, MUTYH, NBN, NF1, NHTL1, PALB2, PDGFRA, PMS2, POLD1, POLE, PTEN, RAD50, RAD51C, RAD51D, SDHB, SDHC, SDHD, SMAD4, SMARCA4. STK11, TP53, TSC1, TSC2, and VHL.  The following genes were evaluated for sequence changes only: SDHA and HOXB13 c.251G>A variant only  11.  Fulvestrant started 08/28/2019, repeated every 28 days--to be continued through December 2024   PLAN: Paislie is now a year and a half out from definitive surgery for her recurrent left breast cancer, with no evidence of disease recurrence.  This is favorable.  She is tolerating fulvestrant well and does not mind continuing the injections on a monthly basis until she completes her 5years.  We talked about some of the symptoms she is experiencing which are really not related to her breast cancer or its treatment.  She will continue to follow-up with them with her primary care physician but today I did not see anything unusual in her left ear.  We also went over some exercises she can do to see if she can help the "popping" she occasionally experiences in her left knee  She will have mammography later this month and then again May 2023 and we will see her in June 2023 for routine follow-up  Total encounter time 30 minutes.Sarajane Jews C. Rainer Mounce, MD 09/10/20 1:06 PM Medical Oncology and Hematology [facility] [place], [place] [address] Tel. [phone]    Fax. [phone]   I, Wilburn Mylar, am acting as scribe for Dr. Virgie Dad. Randale Carvalho.  I, Lurline Del MD, have reviewed the above documentation for accuracy and completeness, and I agree with the above.    *Total Encounter Time as defined by the Centers for Medicare and Medicaid Services includes, in addition to the face-to-face  time of a patient visit (documented in the note above) non-face-to-face time: obtaining and reviewing outside history, ordering and reviewing medications, tests or procedures, care coordination  (communications with other health care professionals or caregivers) and documentation in the medical record.

## 2020-09-16 ENCOUNTER — Telehealth: Payer: Self-pay | Admitting: Hematology and Oncology

## 2020-09-16 NOTE — Telephone Encounter (Signed)
Sch per 5/4 los, left message.

## 2020-10-22 ENCOUNTER — Telehealth: Payer: Self-pay | Admitting: Hematology and Oncology

## 2020-10-22 ENCOUNTER — Telehealth: Payer: Self-pay | Admitting: *Deleted

## 2020-10-22 NOTE — Telephone Encounter (Signed)
Per 6/15 sch msg, left message

## 2020-10-22 NOTE — Telephone Encounter (Signed)
VM left by pt stating she is waiting for a call about when her next injection is scheduled.  Noted last fulvestrant was 09/10/2020 - sent HIGH priority scheduling request per above due to no further injection appointments scheduled.  This RN called pt at given number of [phone] and obtained her VM with message that mailbox was full and could not receive further messages.

## 2020-10-23 ENCOUNTER — Encounter: Payer: Self-pay | Admitting: Oncology

## 2020-10-24 ENCOUNTER — Ambulatory Visit: Payer: BC Managed Care – PPO

## 2020-10-27 ENCOUNTER — Other Ambulatory Visit: Payer: Self-pay

## 2020-10-27 ENCOUNTER — Inpatient Hospital Stay: Payer: BC Managed Care – PPO | Attending: Oncology

## 2020-10-27 VITALS — BP 122/82 | HR 64 | Temp 98.7°F | Resp 18

## 2020-10-27 DIAGNOSIS — Z170 Estrogen receptor positive status [ER+]: Secondary | ICD-10-CM | POA: Insufficient documentation

## 2020-10-27 DIAGNOSIS — C50812 Malignant neoplasm of overlapping sites of left female breast: Secondary | ICD-10-CM | POA: Insufficient documentation

## 2020-10-27 DIAGNOSIS — Z5111 Encounter for antineoplastic chemotherapy: Secondary | ICD-10-CM | POA: Insufficient documentation

## 2020-10-27 MED ORDER — FULVESTRANT 250 MG/5ML IM SOLN
500.0000 mg | Freq: Once | INTRAMUSCULAR | Status: AC
  Administered 2020-10-27: 500 mg via INTRAMUSCULAR

## 2020-10-27 MED ORDER — FULVESTRANT 250 MG/5ML IM SOLN
INTRAMUSCULAR | Status: AC
  Filled 2020-10-27: qty 5

## 2020-11-21 ENCOUNTER — Ambulatory Visit: Payer: BC Managed Care – PPO

## 2020-12-04 ENCOUNTER — Inpatient Hospital Stay: Payer: BC Managed Care – PPO | Attending: Hematology and Oncology

## 2020-12-04 ENCOUNTER — Other Ambulatory Visit: Payer: Self-pay

## 2020-12-04 VITALS — BP 127/63 | HR 71 | Temp 98.6°F | Resp 16

## 2020-12-04 DIAGNOSIS — Z5111 Encounter for antineoplastic chemotherapy: Secondary | ICD-10-CM | POA: Insufficient documentation

## 2020-12-04 DIAGNOSIS — C50812 Malignant neoplasm of overlapping sites of left female breast: Secondary | ICD-10-CM | POA: Diagnosis present

## 2020-12-04 DIAGNOSIS — Z170 Estrogen receptor positive status [ER+]: Secondary | ICD-10-CM | POA: Insufficient documentation

## 2020-12-04 MED ORDER — FULVESTRANT 250 MG/5ML IM SOLN
500.0000 mg | Freq: Once | INTRAMUSCULAR | Status: AC
  Administered 2020-12-04: 500 mg via INTRAMUSCULAR

## 2020-12-04 MED ORDER — FULVESTRANT 250 MG/5ML IM SOLN
INTRAMUSCULAR | Status: AC
  Filled 2020-12-04: qty 10

## 2020-12-04 NOTE — Patient Instructions (Signed)
Fulvestrant injection What is this medication? FULVESTRANT (ful VES trant) blocks the effects of estrogen. It is used to treat breast cancer. This medicine may be used for other purposes; ask your health care provider or pharmacist if you have questions. COMMON BRAND NAME(S): FASLODEX What should I tell my care team before I take this medication? They need to know if you have any of these conditions: bleeding disorders liver disease low blood counts, like low white cell, platelet, or red cell counts an unusual or allergic reaction to fulvestrant, other medicines, foods, dyes, or preservatives pregnant or trying to get pregnant breast-feeding How should I use this medication? This medicine is for injection into a muscle. It is usually given by a health care professional in a hospital or clinic setting. Talk to your pediatrician regarding the use of this medicine in children. Special care may be needed. Overdosage: If you think you have taken too much of this medicine contact a poison control center or emergency room at once. NOTE: This medicine is only for you. Do not share this medicine with others. What if I miss a dose? It is important not to miss your dose. Call your doctor or health care professional if you are unable to keep an appointment. What may interact with this medication? medicines that treat or prevent blood clots like warfarin, enoxaparin, dalteparin, apixaban, dabigatran, and rivaroxaban This list may not describe all possible interactions. Give your health care provider a list of all the medicines, herbs, non-prescription drugs, or dietary supplements you use. Also tell them if you smoke, drink alcohol, or use illegal drugs. Some items may interact with your medicine. What should I watch for while using this medication? Your condition will be monitored carefully while you are receiving this medicine. You will need important blood work done while you are taking this  medicine. Do not become pregnant while taking this medicine or for at least 1 year after stopping it. Women of child-bearing potential will need to have a negative pregnancy test before starting this medicine. Women should inform their doctor if they wish to become pregnant or think they might be pregnant. There is a potential for serious side effects to an unborn child. Men should inform their doctors if they wish to father a child. This medicine may lower sperm counts. Talk to your health care professional or pharmacist for more information. Do not breast-feed an infant while taking this medicine or for 1 year after the last dose. What side effects may I notice from receiving this medication? Side effects that you should report to your doctor or health care professional as soon as possible: allergic reactions like skin rash, itching or hives, swelling of the face, lips, or tongue feeling faint or lightheaded, falls pain, tingling, numbness, or weakness in the legs signs and symptoms of infection like fever or chills; cough; flu-like symptoms; sore throat vaginal bleeding Side effects that usually do not require medical attention (report to your doctor or health care professional if they continue or are bothersome): aches, pains constipation diarrhea headache hot flashes nausea, vomiting pain at site where injected stomach pain This list may not describe all possible side effects. Call your doctor for medical advice about side effects. You may report side effects to FDA at 1-800-FDA-1088. Where should I keep my medication? This drug is given in a hospital or clinic and will not be stored at home. NOTE: This sheet is a summary. It may not cover all possible information. If you have   questions about this medicine, talk to your doctor, pharmacist, or health care provider.  2022 Elsevier/Gold Standard (2017-08-04 11:34:41)  

## 2020-12-19 ENCOUNTER — Inpatient Hospital Stay: Payer: BC Managed Care – PPO

## 2020-12-19 ENCOUNTER — Other Ambulatory Visit: Payer: Self-pay

## 2021-01-01 ENCOUNTER — Other Ambulatory Visit: Payer: Self-pay

## 2021-01-01 ENCOUNTER — Inpatient Hospital Stay: Payer: BC Managed Care – PPO | Attending: Oncology

## 2021-01-01 VITALS — BP 142/86 | HR 72 | Temp 98.6°F | Resp 18

## 2021-01-01 DIAGNOSIS — Z170 Estrogen receptor positive status [ER+]: Secondary | ICD-10-CM | POA: Insufficient documentation

## 2021-01-01 DIAGNOSIS — C50812 Malignant neoplasm of overlapping sites of left female breast: Secondary | ICD-10-CM | POA: Insufficient documentation

## 2021-01-01 MED ORDER — FULVESTRANT 250 MG/5ML IM SOLN
500.0000 mg | Freq: Once | INTRAMUSCULAR | Status: AC
  Administered 2021-01-01: 500 mg via INTRAMUSCULAR
  Filled 2021-01-01: qty 10

## 2021-01-01 NOTE — Patient Instructions (Signed)
Fulvestrant injection What is this medication? FULVESTRANT (ful VES trant) blocks the effects of estrogen. It is used to treat breast cancer. This medicine may be used for other purposes; ask your health care provider or pharmacist if you have questions. COMMON BRAND NAME(S): FASLODEX What should I tell my care team before I take this medication? They need to know if you have any of these conditions: bleeding disorders liver disease low blood counts, like low white cell, platelet, or red cell counts an unusual or allergic reaction to fulvestrant, other medicines, foods, dyes, or preservatives pregnant or trying to get pregnant breast-feeding How should I use this medication? This medicine is for injection into a muscle. It is usually given by a health care professional in a hospital or clinic setting. Talk to your pediatrician regarding the use of this medicine in children. Special care may be needed. Overdosage: If you think you have taken too much of this medicine contact a poison control center or emergency room at once. NOTE: This medicine is only for you. Do not share this medicine with others. What if I miss a dose? It is important not to miss your dose. Call your doctor or health care professional if you are unable to keep an appointment. What may interact with this medication? medicines that treat or prevent blood clots like warfarin, enoxaparin, dalteparin, apixaban, dabigatran, and rivaroxaban This list may not describe all possible interactions. Give your health care provider a list of all the medicines, herbs, non-prescription drugs, or dietary supplements you use. Also tell them if you smoke, drink alcohol, or use illegal drugs. Some items may interact with your medicine. What should I watch for while using this medication? Your condition will be monitored carefully while you are receiving this medicine. You will need important blood work done while you are taking this  medicine. Do not become pregnant while taking this medicine or for at least 1 year after stopping it. Women of child-bearing potential will need to have a negative pregnancy test before starting this medicine. Women should inform their doctor if they wish to become pregnant or think they might be pregnant. There is a potential for serious side effects to an unborn child. Men should inform their doctors if they wish to father a child. This medicine may lower sperm counts. Talk to your health care professional or pharmacist for more information. Do not breast-feed an infant while taking this medicine or for 1 year after the last dose. What side effects may I notice from receiving this medication? Side effects that you should report to your doctor or health care professional as soon as possible: allergic reactions like skin rash, itching or hives, swelling of the face, lips, or tongue feeling faint or lightheaded, falls pain, tingling, numbness, or weakness in the legs signs and symptoms of infection like fever or chills; cough; flu-like symptoms; sore throat vaginal bleeding Side effects that usually do not require medical attention (report to your doctor or health care professional if they continue or are bothersome): aches, pains constipation diarrhea headache hot flashes nausea, vomiting pain at site where injected stomach pain This list may not describe all possible side effects. Call your doctor for medical advice about side effects. You may report side effects to FDA at 1-800-FDA-1088. Where should I keep my medication? This drug is given in a hospital or clinic and will not be stored at home. NOTE: This sheet is a summary. It may not cover all possible information. If you have   questions about this medicine, talk to your doctor, pharmacist, or health care provider.  2022 Elsevier/Gold Standard (2017-08-04 11:34:41)  

## 2021-01-15 ENCOUNTER — Other Ambulatory Visit: Payer: Self-pay | Admitting: Internal Medicine

## 2021-01-15 DIAGNOSIS — H93A2 Pulsatile tinnitus, left ear: Secondary | ICD-10-CM

## 2021-01-23 ENCOUNTER — Ambulatory Visit: Payer: BC Managed Care – PPO

## 2021-01-29 ENCOUNTER — Inpatient Hospital Stay: Payer: BC Managed Care – PPO | Attending: Oncology

## 2021-01-29 ENCOUNTER — Other Ambulatory Visit: Payer: Self-pay

## 2021-01-29 VITALS — BP 144/64 | HR 68 | Temp 99.1°F | Resp 20

## 2021-01-29 DIAGNOSIS — C50912 Malignant neoplasm of unspecified site of left female breast: Secondary | ICD-10-CM | POA: Diagnosis present

## 2021-01-29 DIAGNOSIS — C50812 Malignant neoplasm of overlapping sites of left female breast: Secondary | ICD-10-CM

## 2021-01-29 DIAGNOSIS — Z170 Estrogen receptor positive status [ER+]: Secondary | ICD-10-CM | POA: Diagnosis not present

## 2021-01-29 MED ORDER — FULVESTRANT 250 MG/5ML IM SOLN
500.0000 mg | Freq: Once | INTRAMUSCULAR | Status: AC
  Administered 2021-01-29: 500 mg via INTRAMUSCULAR
  Filled 2021-01-29: qty 10

## 2021-02-03 ENCOUNTER — Encounter: Payer: Self-pay | Admitting: Oncology

## 2021-02-03 ENCOUNTER — Ambulatory Visit
Admission: RE | Admit: 2021-02-03 | Discharge: 2021-02-03 | Disposition: A | Discharge status: Home / Self Care | Payer: BC Managed Care – PPO | Source: Ambulatory Visit | Attending: Internal Medicine | Admitting: Internal Medicine

## 2021-02-03 DIAGNOSIS — H93A2 Pulsatile tinnitus, left ear: Secondary | ICD-10-CM

## 2021-02-03 MED ORDER — IOPAMIDOL (ISOVUE-370) INJECTION 76%
75.0000 mL | Freq: Once | INTRAVENOUS | Status: AC | PRN
  Administered 2021-02-03: 75 mL via INTRAVENOUS

## 2021-02-20 ENCOUNTER — Ambulatory Visit: Payer: BC Managed Care – PPO

## 2021-02-27 ENCOUNTER — Other Ambulatory Visit: Payer: Self-pay

## 2021-02-27 ENCOUNTER — Inpatient Hospital Stay: Payer: BC Managed Care – PPO | Attending: Oncology

## 2021-02-27 VITALS — BP 137/68 | HR 87 | Temp 98.7°F | Resp 18

## 2021-02-27 DIAGNOSIS — Z170 Estrogen receptor positive status [ER+]: Secondary | ICD-10-CM | POA: Insufficient documentation

## 2021-02-27 DIAGNOSIS — C50812 Malignant neoplasm of overlapping sites of left female breast: Secondary | ICD-10-CM | POA: Diagnosis present

## 2021-02-27 MED ORDER — FULVESTRANT 250 MG/5ML IM SOSY
500.0000 mg | PREFILLED_SYRINGE | Freq: Once | INTRAMUSCULAR | Status: AC
  Administered 2021-02-27: 500 mg via INTRAMUSCULAR
  Filled 2021-02-27: qty 10

## 2021-02-27 NOTE — Patient Instructions (Signed)
Fulvestrant injection What is this medication? FULVESTRANT (ful VES trant) blocks the effects of estrogen. It is used to treat breast cancer. This medicine may be used for other purposes; ask your health care provider or pharmacist if you have questions. COMMON BRAND NAME(S): FASLODEX What should I tell my care team before I take this medication? They need to know if you have any of these conditions: bleeding disorders liver disease low blood counts, like low white cell, platelet, or red cell counts an unusual or allergic reaction to fulvestrant, other medicines, foods, dyes, or preservatives pregnant or trying to get pregnant breast-feeding How should I use this medication? This medicine is for injection into a muscle. It is usually given by a health care professional in a hospital or clinic setting. Talk to your pediatrician regarding the use of this medicine in children. Special care may be needed. Overdosage: If you think you have taken too much of this medicine contact a poison control center or emergency room at once. NOTE: This medicine is only for you. Do not share this medicine with others. What if I miss a dose? It is important not to miss your dose. Call your doctor or health care professional if you are unable to keep an appointment. What may interact with this medication? medicines that treat or prevent blood clots like warfarin, enoxaparin, dalteparin, apixaban, dabigatran, and rivaroxaban This list may not describe all possible interactions. Give your health care provider a list of all the medicines, herbs, non-prescription drugs, or dietary supplements you use. Also tell them if you smoke, drink alcohol, or use illegal drugs. Some items may interact with your medicine. What should I watch for while using this medication? Your condition will be monitored carefully while you are receiving this medicine. You will need important blood work done while you are taking this  medicine. Do not become pregnant while taking this medicine or for at least 1 year after stopping it. Women of child-bearing potential will need to have a negative pregnancy test before starting this medicine. Women should inform their doctor if they wish to become pregnant or think they might be pregnant. There is a potential for serious side effects to an unborn child. Men should inform their doctors if they wish to father a child. This medicine may lower sperm counts. Talk to your health care professional or pharmacist for more information. Do not breast-feed an infant while taking this medicine or for 1 year after the last dose. What side effects may I notice from receiving this medication? Side effects that you should report to your doctor or health care professional as soon as possible: allergic reactions like skin rash, itching or hives, swelling of the face, lips, or tongue feeling faint or lightheaded, falls pain, tingling, numbness, or weakness in the legs signs and symptoms of infection like fever or chills; cough; flu-like symptoms; sore throat vaginal bleeding Side effects that usually do not require medical attention (report to your doctor or health care professional if they continue or are bothersome): aches, pains constipation diarrhea headache hot flashes nausea, vomiting pain at site where injected stomach pain This list may not describe all possible side effects. Call your doctor for medical advice about side effects. You may report side effects to FDA at 1-800-FDA-1088. Where should I keep my medication? This drug is given in a hospital or clinic and will not be stored at home. NOTE: This sheet is a summary. It may not cover all possible information. If you have   questions about this medicine, talk to your doctor, pharmacist, or health care provider.  2022 Elsevier/Gold Standard (2017-08-04 11:34:41)  

## 2021-03-27 ENCOUNTER — Other Ambulatory Visit: Payer: Self-pay

## 2021-03-27 ENCOUNTER — Inpatient Hospital Stay: Payer: BC Managed Care – PPO | Attending: Oncology

## 2021-03-27 ENCOUNTER — Inpatient Hospital Stay: Payer: BC Managed Care – PPO

## 2021-03-27 ENCOUNTER — Ambulatory Visit: Payer: BC Managed Care – PPO

## 2021-03-27 VITALS — BP 123/73 | HR 68 | Temp 98.6°F | Resp 18

## 2021-03-27 DIAGNOSIS — C50812 Malignant neoplasm of overlapping sites of left female breast: Secondary | ICD-10-CM | POA: Insufficient documentation

## 2021-03-27 DIAGNOSIS — Z170 Estrogen receptor positive status [ER+]: Secondary | ICD-10-CM | POA: Insufficient documentation

## 2021-03-27 MED ORDER — FULVESTRANT 250 MG/5ML IM SOSY
500.0000 mg | PREFILLED_SYRINGE | Freq: Once | INTRAMUSCULAR | Status: AC
  Administered 2021-03-27: 500 mg via INTRAMUSCULAR
  Filled 2021-03-27: qty 10

## 2021-04-24 ENCOUNTER — Inpatient Hospital Stay: Payer: BC Managed Care – PPO | Attending: Oncology

## 2021-04-24 ENCOUNTER — Ambulatory Visit: Payer: BC Managed Care – PPO

## 2021-04-24 ENCOUNTER — Other Ambulatory Visit: Payer: Self-pay

## 2021-04-24 VITALS — BP 136/71 | HR 67 | Temp 98.7°F | Resp 18

## 2021-04-24 DIAGNOSIS — Z170 Estrogen receptor positive status [ER+]: Secondary | ICD-10-CM | POA: Insufficient documentation

## 2021-04-24 DIAGNOSIS — C50812 Malignant neoplasm of overlapping sites of left female breast: Secondary | ICD-10-CM | POA: Diagnosis not present

## 2021-04-24 MED ORDER — FULVESTRANT 250 MG/5ML IM SOSY
500.0000 mg | PREFILLED_SYRINGE | Freq: Once | INTRAMUSCULAR | Status: AC
  Administered 2021-04-24: 500 mg via INTRAMUSCULAR
  Filled 2021-04-24: qty 10

## 2021-05-25 ENCOUNTER — Ambulatory Visit: Payer: BC Managed Care – PPO

## 2021-05-26 ENCOUNTER — Inpatient Hospital Stay: Payer: BC Managed Care – PPO | Attending: Oncology

## 2021-05-26 ENCOUNTER — Other Ambulatory Visit: Payer: Self-pay | Admitting: Adult Health

## 2021-05-26 ENCOUNTER — Other Ambulatory Visit: Payer: Self-pay

## 2021-05-26 VITALS — BP 142/76 | HR 65 | Temp 98.4°F | Resp 18

## 2021-05-26 DIAGNOSIS — D0512 Intraductal carcinoma in situ of left breast: Secondary | ICD-10-CM | POA: Diagnosis not present

## 2021-05-26 DIAGNOSIS — C50812 Malignant neoplasm of overlapping sites of left female breast: Secondary | ICD-10-CM

## 2021-05-26 MED ORDER — FULVESTRANT 250 MG/5ML IM SOSY
500.0000 mg | PREFILLED_SYRINGE | Freq: Once | INTRAMUSCULAR | Status: AC
  Administered 2021-05-26: 500 mg via INTRAMUSCULAR
  Filled 2021-05-26: qty 10

## 2021-06-26 ENCOUNTER — Inpatient Hospital Stay: Payer: BC Managed Care – PPO | Attending: Oncology

## 2021-06-26 ENCOUNTER — Other Ambulatory Visit: Payer: Self-pay

## 2021-06-26 VITALS — BP 148/80 | HR 71 | Resp 18

## 2021-06-26 DIAGNOSIS — Z170 Estrogen receptor positive status [ER+]: Secondary | ICD-10-CM

## 2021-06-26 DIAGNOSIS — D0512 Intraductal carcinoma in situ of left breast: Secondary | ICD-10-CM | POA: Insufficient documentation

## 2021-06-26 MED ORDER — FULVESTRANT 250 MG/5ML IM SOSY
500.0000 mg | PREFILLED_SYRINGE | Freq: Once | INTRAMUSCULAR | Status: AC
  Administered 2021-06-26: 500 mg via INTRAMUSCULAR
  Filled 2021-06-26: qty 10

## 2021-07-16 ENCOUNTER — Telehealth: Payer: Self-pay | Admitting: Hematology and Oncology

## 2021-07-16 NOTE — Telephone Encounter (Signed)
Rescheduled appointment per providers. Patient aware.  ? ?

## 2021-07-24 ENCOUNTER — Other Ambulatory Visit: Payer: Self-pay

## 2021-07-24 ENCOUNTER — Inpatient Hospital Stay: Payer: BC Managed Care – PPO | Attending: Oncology

## 2021-07-24 VITALS — BP 142/76 | HR 76 | Temp 98.4°F | Resp 18

## 2021-07-24 DIAGNOSIS — Z5111 Encounter for antineoplastic chemotherapy: Secondary | ICD-10-CM | POA: Insufficient documentation

## 2021-07-24 DIAGNOSIS — D0512 Intraductal carcinoma in situ of left breast: Secondary | ICD-10-CM | POA: Insufficient documentation

## 2021-07-24 DIAGNOSIS — C50812 Malignant neoplasm of overlapping sites of left female breast: Secondary | ICD-10-CM

## 2021-07-24 DIAGNOSIS — Z170 Estrogen receptor positive status [ER+]: Secondary | ICD-10-CM

## 2021-07-24 MED ORDER — FULVESTRANT 250 MG/5ML IM SOSY
500.0000 mg | PREFILLED_SYRINGE | Freq: Once | INTRAMUSCULAR | Status: AC
  Administered 2021-07-24: 500 mg via INTRAMUSCULAR
  Filled 2021-07-24: qty 10

## 2021-08-21 ENCOUNTER — Inpatient Hospital Stay: Payer: BC Managed Care – PPO | Attending: Oncology

## 2021-08-21 ENCOUNTER — Other Ambulatory Visit: Payer: Self-pay

## 2021-08-21 VITALS — BP 149/76 | HR 73 | Temp 98.3°F | Resp 18

## 2021-08-21 DIAGNOSIS — C50812 Malignant neoplasm of overlapping sites of left female breast: Secondary | ICD-10-CM | POA: Insufficient documentation

## 2021-08-21 DIAGNOSIS — Z170 Estrogen receptor positive status [ER+]: Secondary | ICD-10-CM | POA: Diagnosis not present

## 2021-08-21 DIAGNOSIS — Z5111 Encounter for antineoplastic chemotherapy: Secondary | ICD-10-CM | POA: Insufficient documentation

## 2021-08-21 MED ORDER — FULVESTRANT 250 MG/5ML IM SOSY
500.0000 mg | PREFILLED_SYRINGE | Freq: Once | INTRAMUSCULAR | Status: AC
  Administered 2021-08-21: 500 mg via INTRAMUSCULAR
  Filled 2021-08-21: qty 10

## 2021-09-18 ENCOUNTER — Other Ambulatory Visit: Payer: Self-pay

## 2021-09-18 ENCOUNTER — Inpatient Hospital Stay: Payer: BC Managed Care – PPO | Attending: Oncology

## 2021-09-18 VITALS — BP 140/82 | HR 70 | Temp 98.6°F | Resp 18

## 2021-09-18 DIAGNOSIS — Z170 Estrogen receptor positive status [ER+]: Secondary | ICD-10-CM

## 2021-09-18 MED ORDER — FULVESTRANT 250 MG/5ML IM SOSY
500.0000 mg | PREFILLED_SYRINGE | Freq: Once | INTRAMUSCULAR | Status: DC
  Filled 2021-09-18: qty 10

## 2021-10-04 IMAGING — CT CT ANGIO HEAD-NECK (W OR W/O PERF)
2 of 11 series · 6 of 34 positions shown · IV contrast (iopamidol)
Comparison: Neck CT with contrast 11/09/2012

CLINICAL DATA: Pulsatile tinnitus in the left ear for 6 months.

EXAM:
CT ANGIOGRAPHY HEAD AND NECK
TECHNIQUE: Multidetector CT imaging of the head and neck was performed using
the standard protocol during bolus administration of intravenous
contrast. Multiplanar CT image reconstructions and MIPs were
obtained to evaluate the vascular anatomy. Carotid stenosis
measurements (when applicable) are obtained utilizing NASCET
criteria, using the distal internal carotid diameter as the
denominator.
Creatinine was obtained on site at [HOSPITAL] at [REDACTED].
Results: Creatinine 0.8 mg/dL.
CONTRAST:  75mL OD008A-JAI IOPAMIDOL (OD008A-JAI) INJECTION 76%

[Series 12: brain 3.00 hr40 s3 sag without ibhc · sagittal · non-contrast · 0.32mm/px · 3 of 57 slices shown]
[im 6/57  soft-tissue]
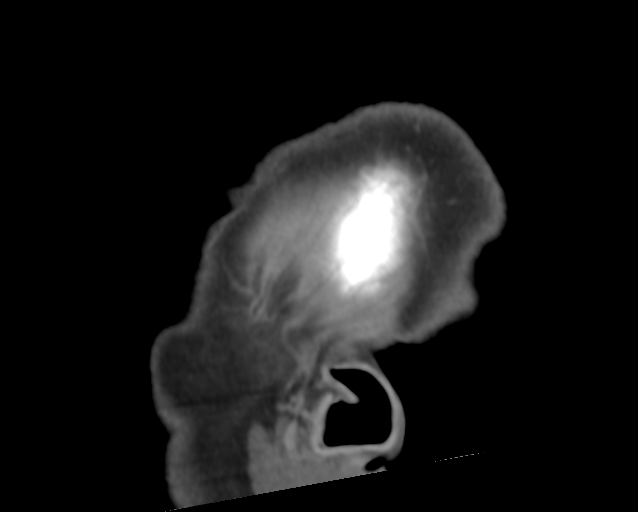
[im 29/57  soft-tissue]
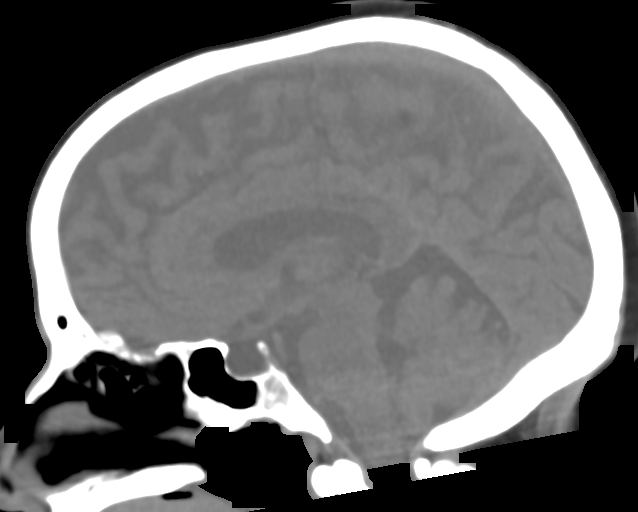
[im 52/57  soft-tissue]
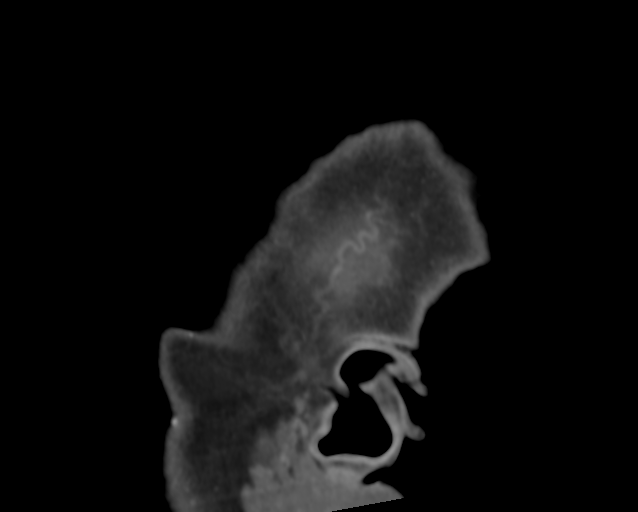

[Series 15: cta head & neck 1.00 hv48 s3 ax thin mips · axial · 0.48mm/px · z∈[-795,-459]mm · 3 of 337 slices shown]
[im 1/337  soft-tissue]
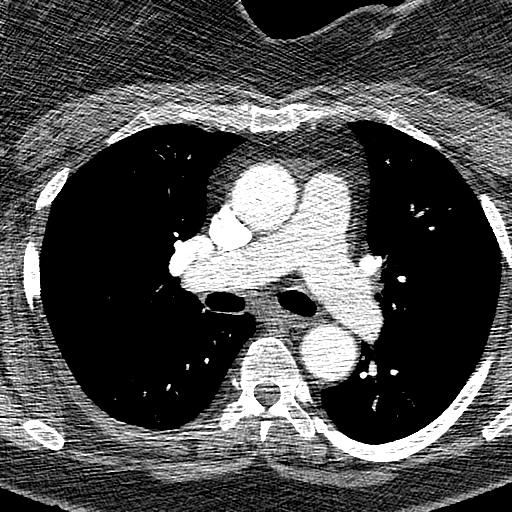
[im 169/337  bone]
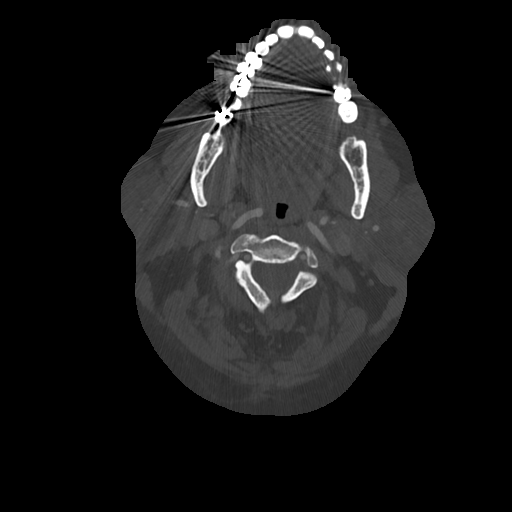
[im 337/337  soft-tissue]
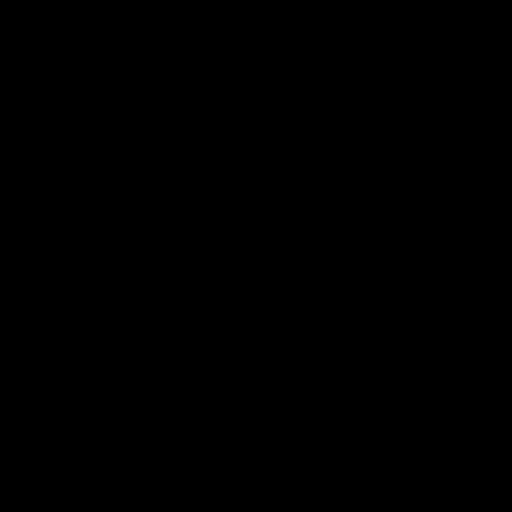

[6 of 34 positions shown; findings below may reference images not displayed]

FINDINGS: CT HEAD FINDINGS

Brain: No evidence of acute infarction, hemorrhage, hydrocephalus,
extra-axial collection or mass lesion/mass effect. Mild cortical
atrophy at the vertex

Vascular: See below

Skull: Normal. Negative for fracture or focal lesion.

Sinuses: Imaged portions are clear. Diffuse opacification of the
left middle ear and mastoids, new from 4692. Partial opacification
of right mastoid air cells. Negative nasopharynx. No detected bony
erosion.

Orbits: Negative

Review of the MIP images confirms the above findings

CTA NECK FINDINGS

Aortic arch: Normal

Right carotid system: Partial retropharyngeal course. No stenosis,
beading, or atheromatous plaque

Left carotid system: Partial retropharyngeal course. No stenosis,
beading, or atheromatous plaque.

Vertebral arteries: No proximal subclavian stenosis. Partially
obscured V1 segments from streak artifact. Where visible the
vertebral arteries are smoothly contoured and widely patent to the
dura. Mild vertebral tortuosity.

Skeleton: Partially empty sella, incidental.

Other neck: No incidental mass or adenopathy

Upper chest: Negative

Review of the MIP images confirms the above findings

CTA HEAD FINDINGS

Anterior circulation: The carotid siphons are smoothly contoured and
widely patent. Hypoplastic right A1 segment and azygous A2 segment.
No stenosis, beading, or aneurysm

Posterior circulation: The vertebral and basilar arteries are
smoothly contoured and widely patent. Fetal type right PCA. No
branch occlusion, beading, or aneurysm.

Venous sinuses: Normal as permitted by contrast timing. Mild
dominance of the left transverse sigmoid drainage. No diverticulum
or dehiscence at the level of the ear.

Anatomic variants: As above

Review of the MIP images confirms the above findings
IMPRESSION: 1. No vascular cause for symptoms. Unremarkable major arteries of
the head and neck.
2. Left more than right mastoid and left middle ear opacification
that is new from 4692. Normal nasopharynx.

## 2021-10-20 ENCOUNTER — Ambulatory Visit: Payer: BC Managed Care – PPO | Admitting: Hematology and Oncology

## 2021-10-20 ENCOUNTER — Other Ambulatory Visit: Payer: BC Managed Care – PPO

## 2021-10-23 ENCOUNTER — Other Ambulatory Visit: Payer: Self-pay

## 2021-10-23 DIAGNOSIS — Z170 Estrogen receptor positive status [ER+]: Secondary | ICD-10-CM

## 2021-10-26 ENCOUNTER — Inpatient Hospital Stay: Payer: BC Managed Care – PPO | Attending: Oncology

## 2021-10-26 ENCOUNTER — Inpatient Hospital Stay: Payer: BC Managed Care – PPO | Admitting: Hematology and Oncology

## 2021-10-26 ENCOUNTER — Inpatient Hospital Stay: Payer: BC Managed Care – PPO

## 2021-10-26 ENCOUNTER — Other Ambulatory Visit: Payer: Self-pay

## 2021-10-26 ENCOUNTER — Encounter: Payer: Self-pay | Admitting: Hematology and Oncology

## 2021-10-26 VITALS — BP 156/84 | HR 65 | Temp 97.9°F | Resp 16 | Ht 60.0 in | Wt 219.9 lb

## 2021-10-26 DIAGNOSIS — Z5111 Encounter for antineoplastic chemotherapy: Secondary | ICD-10-CM | POA: Insufficient documentation

## 2021-10-26 DIAGNOSIS — C50812 Malignant neoplasm of overlapping sites of left female breast: Secondary | ICD-10-CM

## 2021-10-26 DIAGNOSIS — Z170 Estrogen receptor positive status [ER+]: Secondary | ICD-10-CM

## 2021-10-26 LAB — CBC WITH DIFFERENTIAL (CANCER CENTER ONLY)
Abs Immature Granulocytes: 0.01 10*3/uL (ref 0.00–0.07)
Basophils Absolute: 0 10*3/uL (ref 0.0–0.1)
Basophils Relative: 0 %
Eosinophils Absolute: 0 10*3/uL (ref 0.0–0.5)
Eosinophils Relative: 1 %
HCT: 38.2 % (ref 36.0–46.0)
Hemoglobin: 12.5 g/dL (ref 12.0–15.0)
Immature Granulocytes: 0 %
Lymphocytes Relative: 31 %
Lymphs Abs: 1.5 10*3/uL (ref 0.7–4.0)
MCH: 26.8 pg (ref 26.0–34.0)
MCHC: 32.7 g/dL (ref 30.0–36.0)
MCV: 82 fL (ref 80.0–100.0)
Monocytes Absolute: 0.4 10*3/uL (ref 0.1–1.0)
Monocytes Relative: 7 %
Neutro Abs: 3 10*3/uL (ref 1.7–7.7)
Neutrophils Relative %: 61 %
Platelet Count: 298 10*3/uL (ref 150–400)
RBC: 4.66 MIL/uL (ref 3.87–5.11)
RDW: 14.4 % (ref 11.5–15.5)
WBC Count: 5 10*3/uL (ref 4.0–10.5)
nRBC: 0 % (ref 0.0–0.2)

## 2021-10-26 LAB — CMP (CANCER CENTER ONLY)
ALT: 10 U/L (ref 0–44)
AST: 12 U/L — ABNORMAL LOW (ref 15–41)
Albumin: 4.3 g/dL (ref 3.5–5.0)
Alkaline Phosphatase: 55 U/L (ref 38–126)
Anion gap: 8 (ref 5–15)
BUN: 11 mg/dL (ref 8–23)
CO2: 26 mmol/L (ref 22–32)
Calcium: 9.4 mg/dL (ref 8.9–10.3)
Chloride: 104 mmol/L (ref 98–111)
Creatinine: 0.76 mg/dL (ref 0.44–1.00)
GFR, Estimated: >60 mL/min (ref >60)
Glucose, Bld: 93 mg/dL (ref 70–99)
Potassium: 3.7 mmol/L (ref 3.5–5.1)
Sodium: 138 mmol/L (ref 135–145)
Total Bilirubin: 0.4 mg/dL (ref 0.3–1.2)
Total Protein: 7.6 g/dL (ref 6.5–8.1)

## 2021-10-26 MED ORDER — FULVESTRANT 250 MG/5ML IM SOSY
500.0000 mg | PREFILLED_SYRINGE | Freq: Once | INTRAMUSCULAR | Status: AC
  Administered 2021-10-26: 500 mg via INTRAMUSCULAR
  Filled 2021-10-26: qty 10

## 2021-10-26 NOTE — Patient Instructions (Signed)
Fulvestrant injection What is this medication? FULVESTRANT (ful VES trant) blocks the effects of estrogen. It is used to treat breast cancer. This medicine may be used for other purposes; ask your health care provider or pharmacist if you have questions. COMMON BRAND NAME(S): FASLODEX What should I tell my care team before I take this medication? They need to know if you have any of these conditions: bleeding disorders liver disease low blood counts, like low white cell, platelet, or red cell counts an unusual or allergic reaction to fulvestrant, other medicines, foods, dyes, or preservatives pregnant or trying to get pregnant breast-feeding How should I use this medication? This medicine is for injection into a muscle. It is usually given by a health care professional in a hospital or clinic setting. Talk to your pediatrician regarding the use of this medicine in children. Special care may be needed. Overdosage: If you think you have taken too much of this medicine contact a poison control center or emergency room at once. NOTE: This medicine is only for you. Do not share this medicine with others. What if I miss a dose? It is important not to miss your dose. Call your doctor or health care professional if you are unable to keep an appointment. What may interact with this medication? medicines that treat or prevent blood clots like warfarin, enoxaparin, dalteparin, apixaban, dabigatran, and rivaroxaban This list may not describe all possible interactions. Give your health care provider a list of all the medicines, herbs, non-prescription drugs, or dietary supplements you use. Also tell them if you smoke, drink alcohol, or use illegal drugs. Some items may interact with your medicine. What should I watch for while using this medication? Your condition will be monitored carefully while you are receiving this medicine. You will need important blood work done while you are taking this  medicine. Do not become pregnant while taking this medicine or for at least 1 year after stopping it. Women of child-bearing potential will need to have a negative pregnancy test before starting this medicine. Women should inform their doctor if they wish to become pregnant or think they might be pregnant. There is a potential for serious side effects to an unborn child. Men should inform their doctors if they wish to father a child. This medicine may lower sperm counts. Talk to your health care professional or pharmacist for more information. Do not breast-feed an infant while taking this medicine or for 1 year after the last dose. What side effects may I notice from receiving this medication? Side effects that you should report to your doctor or health care professional as soon as possible: allergic reactions like skin rash, itching or hives, swelling of the face, lips, or tongue feeling faint or lightheaded, falls pain, tingling, numbness, or weakness in the legs signs and symptoms of infection like fever or chills; cough; flu-like symptoms; sore throat vaginal bleeding Side effects that usually do not require medical attention (report to your doctor or health care professional if they continue or are bothersome): aches, pains constipation diarrhea headache hot flashes nausea, vomiting pain at site where injected stomach pain This list may not describe all possible side effects. Call your doctor for medical advice about side effects. You may report side effects to FDA at 1-800-FDA-1088. Where should I keep my medication? This drug is given in a hospital or clinic and will not be stored at home. NOTE: This sheet is a summary. It may not cover all possible information. If you have   questions about this medicine, talk to your doctor, pharmacist, or health care provider.  2023 Elsevier/Gold Standard (2017-08-09 00:00:00)  

## 2021-10-26 NOTE — Progress Notes (Signed)
[name]  Telephone:([phone]) [phone] Fax:(336) [phone]   ID: [name]   DOB: [birth year]  MR#: [id number]  XFG#:[id number]  Patient Care Team: Ginger Organ., MD as PCP - General (Internal Medicine) Crissie Reese, MD as Consulting Physician (Plastic Surgery) Benay Pike, MD as Consulting Physician (Hematology and Oncology)  OTHER MD: [name], MD (GYN)   CHIEF COMPLAINT:  Left Breast Cancer (s/p left mastectomy)  CURRENT TREATMENT: fulvestrant  INTERVAL HISTORY:  Laquinta returns today for follow-up of her recurrent left breast cancer  She continues on Fulvestrant every 4 weeks with good tolerance.   She had a mammogram on October 16, 2021 which showed no evidence of malignancy. She hasn't felt any changes in the breast, nipple itches on the right side from time to time. No change in breathing, bowel habits or urinary habits. No new neurological complaints. Rest of the pertinent 10 point ROS reviewed and negative   COVID 19 VACCINATION STATUS: [place] x2, most recently 08/2019   HISTORY OF PRESENT ILLNESS: [name] was referred by [name] for treatment of left breast cancer.   The patient underwent screening bilateral mammography July 04, 2009, at [facility] and this showed only a scattered fibroglandular densities. However, repeat screening exam Sep 29, 2010 showed a potential abnormality in the left breast, retroareolar region. The patient was recalled for additional views Oct 08, 2010 and Dr. [name] was able to locate the nodular density noted on the screening exam. It measured approximately 8 mm. Ultrasound showed a second nodular density just lateral to the nipple areolar complex. In that area, there was what appears to be either a small cluster of cysts or perhaps a solid nodule. In particular, the nodule seen in the 3 o'clock position was felt to be most likely a fibroadenoma. The other area, however, required biopsy and this was performed October 26, 2010. The pathology from this procedure ([id number]) showed a high-grade invasive ductal carcinoma which was estrogen-receptor positive at 98%, progesterone-receptor positive at 13% with an elevated MIB-1 at 69% and no evidence of HER-2 amplification, the ratio by CISH being 1.32.   With this information the patient was referred for bilateral breast MRIs and these were performed October 30, 2010. In the left breast there was a 1.8 cm ill-defined area in the central breast associated with post biopsy changes. There were really no other suspicious areas in either breast and no bony abnormalities and no abnormal appearing or enlarged lymph nodes.   Patient underwent left lumpectomy and sentinel lymph node sampling December 10, 2010 ([id number]) for a 1.1-cm invasive ductal carcinoma, grade 3, with 0/2 sentinel lymph nodes involved and so T1c N0 (stage I); the tumor being estrogen receptor 98% and progesterone receptor 13% positive, with no HER-2 amplification (by initial determination, but amplified on repeat with a ratio of 2.30); with an Oncotype DX score of 43 predicting a 29% risk of recurrence if she takes 5 years of tamoxifen. (Her-2 was negative on the Oncotype DX determination, which uses a completely different method).   Her subsequent history is as detailed below.   PAST MEDICAL HISTORY: Past Medical History:  Diagnosis Date   BRCA negative    Breast cancer ([place]) 2018   stage I high grade invasive ductal ca left breast   Breast lump    left   Cancer ([place]) 2012   lumpectomy--Lt.Br.   Family history of breast cancer 06/23/2018   Family history of prostate cancer 06/23/2018   Gum disease  Hyperlipidemia    diet and exercise controlled, no med   Hypertension    Pre-diabetes    borderline - diet and exercise contolled, no meds   Sleep apnea    uses CPAP nightly   Urinary incontinence     PAST SURGICAL HISTORY: Past Surgical History:  Procedure Laterality Date   BREAST LUMPECTOMY  2012    left   COLONOSCOPY     FOOT MASS EXCISION     LAPAROSCOPIC GASTRIC BANDING     LATISSIMUS FLAP TO BREAST Left 03/01/2019   Procedure: LEFT BREAST LATIISSIMUS FLAP;  Surgeon: Crissie Reese, MD;  Location: [facility];  Service: Plastics;  Laterality: Left;   MASS EXCISION     back   MASTECTOMY W/ SENTINEL NODE BIOPSY Left 03/01/2019   Procedure: LEFT MASTECTOMY WITH LEFT SENTINEL LYMPH NODE BIOPSY;  Surgeon: Alphonsa Overall, MD;  Location: [facility];  Service: General;  Laterality: Left;   PLACEMENT OF BREAST IMPLANTS Left 03/01/2019   Procedure: PLACEMENT OF SALINE BREAST IMPLANT;  Surgeon: Crissie Reese, MD;  Location: [place] [facility];  Service: Plastics;  Laterality: Left;   PORTACATH PLACEMENT  02/11/11   PORTACATH REMOVAL  MARCH 2014   RESECTOSCOPIC POLYPECTOMY  2001   MYOMECTOMY   SOFT TISSUE CYST EXCISION     back cyst   VAGINAL MASS EXCISION      FAMILY HISTORY Family History  Problem Relation Age of Onset   Cancer Maternal Uncle 75       prostate, metastatic   Alcohol abuse Father    Cirrhosis Father    Breast cancer Mother 7       Stage 1   Hypertension Mother     GYNECOLOGIC HISTORY: ((Updated 01/26/2013) She had menarche at age 66. She is GX P0.  Last menstrual period was late May 2012.  She has never used hormone replacement.     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  She is divorced and lives by herself. She attends a [employer]. She used to go to the gym at least three times a week prior to the pandemic and she is still doing some chair exercises and tai chi now.    ADVANCED DIRECTIVES: Not in place   HEALTH MAINTENANCE: Social History   Tobacco Use   Smoking status: Former    Packs/day: 1.00    Years: 21.00    Total pack years: 21.00    Types: Cigarettes    Quit date: 03/25/1999    Years since quitting: 22.6   Smokeless tobacco: Never  Vaping Use   Vaping Use: Never used  Substance Use Topics    Alcohol use: Yes    Alcohol/week: 7.0 standard drinks of alcohol    Types: 7 Glasses of wine per week    Comment: red wine   Drug use: No     Colonoscopy:   PAP:   Bone density: 09/2019, -1.0  Lipid panel:    No Known Allergies  Current Outpatient Medications  Medication Sig Dispense Refill   amLODipine-olmesartan (AZOR) 10-40 MG per tablet Take 1 tablet by mouth daily. (Patient taking differently: Take 1 tablet by mouth at bedtime. ) 30 tablet 6   calcium carbonate (OS-CAL) 1250 (500 Ca) MG chewable tablet Chew by mouth.     cyanocobalamin 1000 MCG tablet Take 1 tablet by mouth as needed.     ELDERBERRY PO Take 1 tablet by mouth as needed.     Multiple Vitamin (  MULTIVITAMIN) capsule Take 1 capsule by mouth daily.     triamterene-hydrochlorothiazide (DYAZIDE) 37.5-25 MG capsule Take 1 each (1 capsule total) by mouth daily.     No current facility-administered medications for this visit.    OBJECTIVE: African-American woman who appears stated age 73:   10/26/21 1300  BP: (!) 156/84  Pulse: 65  Resp: 16  Temp: 97.9 F (36.6 C)  SpO2: 100%     Body mass index is 42.95 kg/m.    ECOG FS: 1 Filed Weights   10/26/21 1300  Weight: 219 lb 14.4 oz (99.7 kg)    Physical Exam Constitutional:      Appearance: Normal appearance.  Cardiovascular:     Rate and Rhythm: Normal rate and regular rhythm.  Chest:     Comments: S.p left mastectomy with implant. No palpable masses or regional adenopathy Right breast normal to inspection and palpation. No regional adenopathy Musculoskeletal:        General: No swelling or tenderness. Normal range of motion.     Cervical back: Normal range of motion and neck supple.  Neurological:     Mental Status: She is alert.     LAB RESULTS: Lab Results  Component Value Date   WBC 5.0 10/26/2021   NEUTROABS 3.0 10/26/2021   HGB 12.5 10/26/2021   HCT 38.2 10/26/2021   MCV 82.0 10/26/2021   PLT 298 10/26/2021      Chemistry       Component Value Date/Time   NA 138 10/26/2021 1240   NA 140 04/22/2017 1327   K 3.7 10/26/2021 1240   K 4.0 04/22/2017 1327   CL 104 10/26/2021 1240   CL 105 09/08/2012 1320   CO2 26 10/26/2021 1240   CO2 25 04/22/2017 1327   BUN 11 10/26/2021 1240   BUN 15.0 04/22/2017 1327   CREATININE 0.76 10/26/2021 1240   CREATININE 0.8 04/22/2017 1327      Component Value Date/Time   CALCIUM 9.4 10/26/2021 1240   CALCIUM 9.0 04/22/2017 1327   ALKPHOS 55 10/26/2021 1240   ALKPHOS 58 04/22/2017 1327   AST 12 (L) 10/26/2021 1240   AST 13 04/22/2017 1327   ALT 10 10/26/2021 1240   ALT 11 04/22/2017 1327   BILITOT 0.4 10/26/2021 1240   BILITOT 0.38 04/22/2017 1327      STUDIES: No results found.    ASSESSMENT: 66 y.o. [place] woman   1. Status post left lumpectomy and sentinel lymph node sampling August 2012 for a T1cN0, stage IA invasive ductal carcinoma of overlapping sites, grade 3, estrogen receptor 98% and progesterone receptor 13% positive, HER-2/neu amplified with a ratio of 2.3.   2. Oncotype showed a recurrence score of 43 ( "high risk"), predicting a 29% risk of recurrence if the patient's only adjuvant treatment was tamoxifen   3. received adjuvant docetaxel/ cyclophosphamide x4, completed December 2012   4. trastuzumab started 04/15/2011, completed 05/05/2012; final echo 02/18/2012  5. radiation therapy, completed July 23, 2011  6. began on tamoxifen March 2013, but never taken consistently   RECURRENT DISEASE: November 2018 7.  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  Anastrozole started 04/22/2017, discontinued January 2020, poorly tolerated  (a) mammography at [facility] 03/20/2018 shows evidence of growth  (b) exemestane started January 2020, continued with significant  interruptions  (c) bone density 09/27/2019  showed a T score of -1.0.  (d) exemestane discontinued April 2021 with multiple side effects  9.  Status post left mastectomy with sentinel lymph node sampling 03/01/2019 for a pT2 pN0, stage IB  invasive ductal carcinoma, grade 2, with negative margins.  The invasive tumor was estrogen and progesterone receptor positive, with an MIB-1 of 30%.  HER-2 was not amplified.  (a) immediate left latissimus reconstruction with saline implant placement  10.  Genetics testing 07/17/2018 through the Common Hereditary Cancers Panel offered by [facility] found no deleterious mutations in APC, ATM, AXIN2, BARD1, BMPR1A, BRCA1, BRCA2, BRIP1, CDH1, CDKN2A (p14ARF), CDKN2A (p16INK4a), CKD4, CHEK2, CTNNA1, DICER1, EPCAM (Deletion/duplication testing only), GREM1 (promoter region deletion/duplication testing only), KIT, MEN1, MLH1, MSH2, MSH3, MSH6, MUTYH, NBN, NF1, NHTL1, PALB2, PDGFRA, PMS2, POLD1, POLE, PTEN, RAD50, RAD51C, RAD51D, SDHB, SDHC, SDHD, SMAD4, SMARCA4. STK11, TP53, TSC1, TSC2, and VHL.  The following genes were evaluated for sequence changes only: SDHA and HOXB13 c.251G>A variant only  11.  Fulvestrant started 08/28/2019, repeated every 28 days--to be continued through December 2024   PLAN:  She is tolerating faslodex well. No concern for evidence of disease recurrence. Continue Faslodex as recommended above, Dr. Jana Hakim given her interruption suggested that she continue it till December 2024.  Most recent mammogram reviewed and no evidence of malignancy, repeat mammogram recommended in 1 year. She has been tolerating Faslodex quite well so far.  I will follow-up with her in 6 months.  CBC and CMP from today showed no concerns. Bone density has been ordered by Dr. Jana Hakim, this has not been scheduled.  Last bone density in May 2021 with improved bone density, T score of maximum at -1 and right femur neck. Encouraged calcium/vitamin D supplements and  weightbearing exercises. We will try to obtain her bone density results from [facility].  Total time spent: 30 minutes *Total Encounter Time as defined by the Centers for Medicare and Medicaid Services includes, in addition to the face-to-face time of a patient visit (documented in the note above) non-face-to-face time: obtaining and reviewing outside history, ordering and reviewing medications, tests or procedures, care coordination (communications with other health care professionals or caregivers) and documentation in the medical record.

## 2021-10-26 NOTE — Progress Notes (Signed)
Orders for Bone Density entered per MD and faxed to [place] [facility]. Fax confirmation received.

## 2021-11-18 ENCOUNTER — Inpatient Hospital Stay: Payer: BC Managed Care – PPO | Attending: Oncology

## 2021-11-18 ENCOUNTER — Other Ambulatory Visit: Payer: Self-pay

## 2021-11-18 VITALS — BP 129/69 | HR 87 | Temp 98.8°F | Resp 18

## 2021-11-18 DIAGNOSIS — Z5111 Encounter for antineoplastic chemotherapy: Secondary | ICD-10-CM | POA: Diagnosis not present

## 2021-11-18 DIAGNOSIS — C50812 Malignant neoplasm of overlapping sites of left female breast: Secondary | ICD-10-CM | POA: Diagnosis not present

## 2021-11-18 DIAGNOSIS — Z170 Estrogen receptor positive status [ER+]: Secondary | ICD-10-CM | POA: Insufficient documentation

## 2021-11-18 MED ORDER — FULVESTRANT 250 MG/5ML IM SOSY
500.0000 mg | PREFILLED_SYRINGE | Freq: Once | INTRAMUSCULAR | Status: AC
  Administered 2021-11-18: 500 mg via INTRAMUSCULAR
  Filled 2021-11-18: qty 10

## 2021-11-18 NOTE — Patient Instructions (Signed)
Fulvestrant injection What is this medication? FULVESTRANT (ful VES trant) blocks the effects of estrogen. It is used to treat breast cancer. This medicine may be used for other purposes; ask your health care provider or pharmacist if you have questions. COMMON BRAND NAME(S): FASLODEX What should I tell my care team before I take this medication? They need to know if you have any of these conditions: bleeding disorders liver disease low blood counts, like low white cell, platelet, or red cell counts an unusual or allergic reaction to fulvestrant, other medicines, foods, dyes, or preservatives pregnant or trying to get pregnant breast-feeding How should I use this medication? This medicine is for injection into a muscle. It is usually given by a health care professional in a hospital or clinic setting. Talk to your pediatrician regarding the use of this medicine in children. Special care may be needed. Overdosage: If you think you have taken too much of this medicine contact a poison control center or emergency room at once. NOTE: This medicine is only for you. Do not share this medicine with others. What if I miss a dose? It is important not to miss your dose. Call your doctor or health care professional if you are unable to keep an appointment. What may interact with this medication? medicines that treat or prevent blood clots like warfarin, enoxaparin, dalteparin, apixaban, dabigatran, and rivaroxaban This list may not describe all possible interactions. Give your health care provider a list of all the medicines, herbs, non-prescription drugs, or dietary supplements you use. Also tell them if you smoke, drink alcohol, or use illegal drugs. Some items may interact with your medicine. What should I watch for while using this medication? Your condition will be monitored carefully while you are receiving this medicine. You will need important blood work done while you are taking this  medicine. Do not become pregnant while taking this medicine or for at least 1 year after stopping it. Women of child-bearing potential will need to have a negative pregnancy test before starting this medicine. Women should inform their doctor if they wish to become pregnant or think they might be pregnant. There is a potential for serious side effects to an unborn child. Men should inform their doctors if they wish to father a child. This medicine may lower sperm counts. Talk to your health care professional or pharmacist for more information. Do not breast-feed an infant while taking this medicine or for 1 year after the last dose. What side effects may I notice from receiving this medication? Side effects that you should report to your doctor or health care professional as soon as possible: allergic reactions like skin rash, itching or hives, swelling of the face, lips, or tongue feeling faint or lightheaded, falls pain, tingling, numbness, or weakness in the legs signs and symptoms of infection like fever or chills; cough; flu-like symptoms; sore throat vaginal bleeding Side effects that usually do not require medical attention (report to your doctor or health care professional if they continue or are bothersome): aches, pains constipation diarrhea headache hot flashes nausea, vomiting pain at site where injected stomach pain This list may not describe all possible side effects. Call your doctor for medical advice about side effects. You may report side effects to FDA at 1-800-FDA-1088. Where should I keep my medication? This drug is given in a hospital or clinic and will not be stored at home. NOTE: This sheet is a summary. It may not cover all possible information. If you have   questions about this medicine, talk to your doctor, pharmacist, or health care provider.  2023 Elsevier/Gold Standard (2017-08-09 00:00:00)  

## 2021-12-16 ENCOUNTER — Inpatient Hospital Stay: Payer: BC Managed Care – PPO | Attending: Oncology

## 2021-12-16 ENCOUNTER — Other Ambulatory Visit: Payer: Self-pay

## 2021-12-16 VITALS — BP 121/58 | HR 65 | Temp 98.3°F | Resp 18

## 2021-12-16 DIAGNOSIS — Z170 Estrogen receptor positive status [ER+]: Secondary | ICD-10-CM | POA: Insufficient documentation

## 2021-12-16 DIAGNOSIS — C50812 Malignant neoplasm of overlapping sites of left female breast: Secondary | ICD-10-CM | POA: Diagnosis not present

## 2021-12-16 DIAGNOSIS — Z5111 Encounter for antineoplastic chemotherapy: Secondary | ICD-10-CM | POA: Diagnosis present

## 2021-12-16 MED ORDER — FULVESTRANT 250 MG/5ML IM SOSY
500.0000 mg | PREFILLED_SYRINGE | Freq: Once | INTRAMUSCULAR | Status: AC
  Administered 2021-12-16: 500 mg via INTRAMUSCULAR
  Filled 2021-12-16: qty 10

## 2021-12-16 NOTE — Progress Notes (Signed)
Faslodex re ordered.

## 2022-01-13 ENCOUNTER — Other Ambulatory Visit: Payer: Self-pay

## 2022-01-13 ENCOUNTER — Inpatient Hospital Stay: Payer: Medicare PPO | Attending: Oncology

## 2022-01-13 VITALS — BP 139/76 | HR 74 | Temp 98.8°F | Resp 18

## 2022-01-13 DIAGNOSIS — Z5111 Encounter for antineoplastic chemotherapy: Secondary | ICD-10-CM | POA: Diagnosis present

## 2022-01-13 DIAGNOSIS — C50812 Malignant neoplasm of overlapping sites of left female breast: Secondary | ICD-10-CM | POA: Insufficient documentation

## 2022-01-13 DIAGNOSIS — Z170 Estrogen receptor positive status [ER+]: Secondary | ICD-10-CM | POA: Diagnosis not present

## 2022-01-13 MED ORDER — FULVESTRANT 250 MG/5ML IM SOSY
500.0000 mg | PREFILLED_SYRINGE | Freq: Once | INTRAMUSCULAR | Status: AC
  Administered 2022-01-13: 500 mg via INTRAMUSCULAR
  Filled 2022-01-13: qty 10

## 2022-01-13 NOTE — Patient Instructions (Signed)

## 2022-02-03 ENCOUNTER — Encounter: Payer: Self-pay | Admitting: Oncology

## 2022-02-10 ENCOUNTER — Encounter: Payer: Self-pay | Admitting: Oncology

## 2022-02-10 ENCOUNTER — Inpatient Hospital Stay: Payer: Medicare PPO | Attending: Oncology

## 2022-02-10 VITALS — BP 144/75 | HR 65 | Temp 99.0°F | Resp 18

## 2022-02-10 DIAGNOSIS — Z5111 Encounter for antineoplastic chemotherapy: Secondary | ICD-10-CM | POA: Diagnosis present

## 2022-02-10 DIAGNOSIS — C50812 Malignant neoplasm of overlapping sites of left female breast: Secondary | ICD-10-CM

## 2022-02-10 DIAGNOSIS — D0512 Intraductal carcinoma in situ of left breast: Secondary | ICD-10-CM | POA: Insufficient documentation

## 2022-02-10 MED ORDER — FULVESTRANT 250 MG/5ML IM SOSY
500.0000 mg | PREFILLED_SYRINGE | Freq: Once | INTRAMUSCULAR | Status: AC
  Administered 2022-02-10: 500 mg via INTRAMUSCULAR
  Filled 2022-02-10: qty 10

## 2022-03-10 ENCOUNTER — Other Ambulatory Visit: Payer: Self-pay

## 2022-03-10 ENCOUNTER — Inpatient Hospital Stay: Payer: Medicare PPO | Attending: Oncology

## 2022-03-10 VITALS — BP 142/84 | HR 72 | Temp 98.7°F | Resp 18

## 2022-03-10 DIAGNOSIS — Z5111 Encounter for antineoplastic chemotherapy: Secondary | ICD-10-CM | POA: Insufficient documentation

## 2022-03-10 DIAGNOSIS — C50812 Malignant neoplasm of overlapping sites of left female breast: Secondary | ICD-10-CM | POA: Insufficient documentation

## 2022-03-10 DIAGNOSIS — Z170 Estrogen receptor positive status [ER+]: Secondary | ICD-10-CM | POA: Insufficient documentation

## 2022-03-10 MED ORDER — FULVESTRANT 250 MG/5ML IM SOSY
500.0000 mg | PREFILLED_SYRINGE | Freq: Once | INTRAMUSCULAR | Status: AC
  Administered 2022-03-10: 500 mg via INTRAMUSCULAR
  Filled 2022-03-10: qty 10

## 2022-03-10 NOTE — Patient Instructions (Signed)

## 2022-04-07 ENCOUNTER — Other Ambulatory Visit: Payer: Self-pay

## 2022-04-07 ENCOUNTER — Inpatient Hospital Stay: Payer: Medicare PPO

## 2022-04-07 VITALS — BP 154/87 | HR 66 | Temp 98.0°F | Resp 18

## 2022-04-07 DIAGNOSIS — Z5111 Encounter for antineoplastic chemotherapy: Secondary | ICD-10-CM | POA: Diagnosis not present

## 2022-04-07 DIAGNOSIS — Z170 Estrogen receptor positive status [ER+]: Secondary | ICD-10-CM

## 2022-04-07 MED ORDER — FULVESTRANT 250 MG/5ML IM SOSY
500.0000 mg | PREFILLED_SYRINGE | Freq: Once | INTRAMUSCULAR | Status: AC
  Administered 2022-04-07: 500 mg via INTRAMUSCULAR
  Filled 2022-04-07: qty 10

## 2022-04-21 ENCOUNTER — Ambulatory Visit: Payer: Medicare PPO | Admitting: Orthopaedic Surgery

## 2022-05-04 ENCOUNTER — Other Ambulatory Visit: Payer: Self-pay | Admitting: *Deleted

## 2022-05-04 DIAGNOSIS — Z170 Estrogen receptor positive status [ER+]: Secondary | ICD-10-CM

## 2022-05-05 ENCOUNTER — Inpatient Hospital Stay: Payer: Medicare PPO | Admitting: Physician Assistant

## 2022-05-05 ENCOUNTER — Other Ambulatory Visit: Payer: Self-pay

## 2022-05-05 ENCOUNTER — Inpatient Hospital Stay: Payer: Medicare PPO

## 2022-05-05 ENCOUNTER — Inpatient Hospital Stay: Payer: Medicare PPO | Attending: Oncology

## 2022-05-05 VITALS — BP 141/78 | HR 67 | Resp 18 | Ht 60.0 in | Wt 215.3 lb

## 2022-05-05 DIAGNOSIS — Z5111 Encounter for antineoplastic chemotherapy: Secondary | ICD-10-CM | POA: Insufficient documentation

## 2022-05-05 DIAGNOSIS — Z170 Estrogen receptor positive status [ER+]: Secondary | ICD-10-CM

## 2022-05-05 DIAGNOSIS — Z79810 Long term (current) use of selective estrogen receptor modulators (SERMs): Secondary | ICD-10-CM | POA: Diagnosis not present

## 2022-05-05 DIAGNOSIS — M8580 Other specified disorders of bone density and structure, unspecified site: Secondary | ICD-10-CM | POA: Diagnosis not present

## 2022-05-05 DIAGNOSIS — C50812 Malignant neoplasm of overlapping sites of left female breast: Secondary | ICD-10-CM | POA: Diagnosis not present

## 2022-05-05 LAB — CBC WITH DIFFERENTIAL (CANCER CENTER ONLY)
Abs Immature Granulocytes: 0 10*3/uL (ref 0.00–0.07)
Basophils Absolute: 0 10*3/uL (ref 0.0–0.1)
Basophils Relative: 1 %
Eosinophils Absolute: 0.1 10*3/uL (ref 0.0–0.5)
Eosinophils Relative: 1 %
HCT: 38.9 % (ref 36.0–46.0)
Hemoglobin: 12.9 g/dL (ref 12.0–15.0)
Immature Granulocytes: 0 %
Lymphocytes Relative: 32 %
Lymphs Abs: 1.4 10*3/uL (ref 0.7–4.0)
MCH: 27.2 pg (ref 26.0–34.0)
MCHC: 33.2 g/dL (ref 30.0–36.0)
MCV: 82.1 fL (ref 80.0–100.0)
Monocytes Absolute: 0.4 10*3/uL (ref 0.1–1.0)
Monocytes Relative: 8 %
Neutro Abs: 2.6 10*3/uL (ref 1.7–7.7)
Neutrophils Relative %: 58 %
Platelet Count: 289 10*3/uL (ref 150–400)
RBC: 4.74 MIL/uL (ref 3.87–5.11)
RDW: 13.9 % (ref 11.5–15.5)
WBC Count: 4.4 10*3/uL (ref 4.0–10.5)
nRBC: 0 % (ref 0.0–0.2)

## 2022-05-05 LAB — CMP (CANCER CENTER ONLY)
ALT: 10 U/L (ref 0–44)
AST: 12 U/L — ABNORMAL LOW (ref 15–41)
Albumin: 4.2 g/dL (ref 3.5–5.0)
Alkaline Phosphatase: 54 U/L (ref 38–126)
Anion gap: 6 (ref 5–15)
BUN: 10 mg/dL (ref 8–23)
CO2: 27 mmol/L (ref 22–32)
Calcium: 9.4 mg/dL (ref 8.9–10.3)
Chloride: 104 mmol/L (ref 98–111)
Creatinine: 0.76 mg/dL (ref 0.44–1.00)
GFR, Estimated: >60 mL/min (ref >60)
Glucose, Bld: 91 mg/dL (ref 70–99)
Potassium: 3.9 mmol/L (ref 3.5–5.1)
Sodium: 137 mmol/L (ref 135–145)
Total Bilirubin: 0.4 mg/dL (ref 0.3–1.2)
Total Protein: 7.5 g/dL (ref 6.5–8.1)

## 2022-05-05 MED ORDER — FULVESTRANT 250 MG/5ML IM SOSY
500.0000 mg | PREFILLED_SYRINGE | Freq: Once | INTRAMUSCULAR | Status: AC
  Administered 2022-05-05: 500 mg via INTRAMUSCULAR
  Filled 2022-05-05: qty 10

## 2022-05-05 NOTE — Progress Notes (Signed)
[facility]  [phone] [phone]   ID: ALLEY NEILS   DOB: 1955/05/16  MR#: [id number]  ERX#:[id number]  Patient Care Team: Ginger Organ., MD as PCP - General (Internal Medicine) Crissie Reese, MD as Consulting Physician (Plastic Surgery) Benay Pike, MD as Consulting Physician (Hematology and Oncology)   CHIEF COMPLAINT:  Left Breast Cancer (s/p left mastectomy)  CURRENT TREATMENT: Fulvestrant  INTERVAL HISTORY: [name] returns for a follow up for recurrent left breast cancer. She is unaccompanied for this visit. She was last seen by Dr. Chryl Heck on 10/26/2021. Iin the interim she continues on Fulvestrant every 4 weeks.   She is tolerating therapy well without any new symptoms. She is trying to exercise but does complain of hip and right foot pain. She is scheduled for a consultation with ortho specialist on 05/12/2022. She has lost 4 lbs intentionally since June 2023 due to diet modifications and exercise. She denies nausea, vomiting or abdominal pain. Her bowel habits are unchanged without recurrent episodes of diarrhea or constipation. She denies easy bruising or signs of active bleeding. She denies any new breast changes or pain. She is otherwise feeling well.   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   Family history of breast cancer 06/23/2018   Family history of prostate cancer 06/23/2018   Gum disease    Hyperlipidemia    diet and exercise controlled, no med   Hypertension    Pre-diabetes    borderline - diet and exercise contolled, no meds   Sleep apnea    uses CPAP nightly   Urinary incontinence     PAST SURGICAL HISTORY: Past Surgical History:  Procedure Laterality Date   BREAST  LUMPECTOMY  2012   left   COLONOSCOPY     FOOT MASS EXCISION     LAPAROSCOPIC GASTRIC BANDING     LATISSIMUS FLAP TO BREAST Left 03/01/2019   Procedure: LEFT BREAST LATIISSIMUS FLAP;  Surgeon: Crissie Reese, MD;  Location: [facility];  Service: Plastics;  Laterality: Left;   MASS EXCISION     back   MASTECTOMY W/ SENTINEL NODE BIOPSY Left 03/01/2019   Procedure: LEFT MASTECTOMY WITH LEFT SENTINEL LYMPH NODE BIOPSY;  Surgeon: Alphonsa Overall, MD;  Location: [facility];  Service: General;  Laterality: Left;   PLACEMENT OF BREAST IMPLANTS Left 03/01/2019   Procedure: PLACEMENT OF SALINE BREAST IMPLANT;  Surgeon: Crissie Reese, MD;  Location: [facility];  Service: Plastics;  Laterality: Left;   PORTACATH PLACEMENT  02/11/11   PORTACATH REMOVAL  MARCH 2014   RESECTOSCOPIC POLYPECTOMY  2001   MYOMECTOMY   SOFT TISSUE CYST EXCISION     back cyst   VAGINAL MASS EXCISION      FAMILY HISTORY Family History  Problem Relation Age of Onset   Cancer Maternal Uncle 53       prostate, metastatic   Alcohol abuse Father    Cirrhosis Father    Breast cancer Mother 15       Stage 1   Hypertension Mother     SOCIAL HISTORY:  Social History   Socioeconomic History   Marital status: Divorced    Spouse name: Not on file  Number of children: Not on file   Years of education: Not on file   Highest education level: Not on file  Occupational History   Not on file  Tobacco Use   Smoking status: Former    Packs/day: 1.00    Years: 21.00    Total pack years: 21.00    Types: Cigarettes    Quit date: 03/25/1999    Years since quitting: 23.1   Smokeless tobacco: Never  Vaping Use   Vaping Use: Never used  Substance and Sexual Activity   Alcohol use: Yes    Alcohol/week: 7.0 standard drinks of alcohol    Types: 7 Glasses of wine per week    Comment: red wine   Drug use: No   Sexual activity: Yes    Birth control/protection: Post-menopausal  Other Topics Concern   Not on file  Social History Narrative    Not on file   Social Determinants of Health   Financial Resource Strain: Not on file  Food Insecurity: Not on file  Transportation Needs: Not on file  Physical Activity: Not on file  Stress: Not on file  Social Connections: Not on file     HEALTH MAINTENANCE: Social History   Tobacco Use   Smoking status: Former    Packs/day: 1.00    Years: 21.00    Total pack years: 21.00    Types: Cigarettes    Quit date: 03/25/1999    Years since quitting: 23.1   Smokeless tobacco: Never  Vaping Use   Vaping Use: Never used  Substance Use Topics   Alcohol use: Yes    Alcohol/week: 7.0 standard drinks of alcohol    Types: 7 Glasses of wine per week    Comment: red wine   Drug use: No    Allergies  Allergen Reactions   Lisinopril Cough    Current Outpatient Medications  Medication Sig Dispense Refill   amLODipine-olmesartan (AZOR) 10-40 MG per tablet Take 1 tablet by mouth daily. (Patient taking differently: Take 1 tablet by mouth at bedtime.) 30 tablet 6   cholecalciferol (VITAMIN D3) 25 MCG (1000 UNIT) tablet 1 tablet Orally Once a day     ELDERBERRY PO Take 1 tablet by mouth as needed.     metFORMIN (GLUCOPHAGE-XR) 500 MG 24 hr tablet Take 500 mg by mouth 2 (two) times daily.     Multiple Vitamin (MULTIVITAMIN) capsule Take 1 capsule by mouth daily.     OZEMPIC, 1 MG/DOSE, 4 MG/3ML SOPN Inject 1 mg into the skin once a week.     solifenacin (VESICARE) 5 MG tablet Take 5 mg by mouth daily.     calcium carbonate (OS-CAL) 1250 (500 Ca) MG chewable tablet Chew by mouth. (Patient not taking: Reported on 05/05/2022)     cyanocobalamin 1000 MCG tablet Take 1 tablet by mouth as needed. (Patient not taking: Reported on 05/05/2022)     triamterene-hydrochlorothiazide (DYAZIDE) 37.5-25 MG capsule Take 1 each (1 capsule total) by mouth daily. (Patient not taking: Reported on 05/05/2022)     No current facility-administered medications for this visit.    PHYSICAL EXAM: Vitals:    05/05/22 1304  BP: (!) 141/78  Pulse: 67  Resp: 18  SpO2: 100%     Body mass index is 42.05 kg/m.    ECOG FS: 1 Filed Weights   05/05/22 1304  Weight: 215 lb 4.8 oz (97.7 kg)    Constitutional: Oriented to person, place, and time and well-developed, well-nourished, and in no distress.  HENT:  Head: Normocephalic and atraumatic.  Eyes: Conjunctivae are normal. Right eye exhibits no discharge. Left eye exhibits no discharge. No scleral icterus.  Neck: Normal range of motion. Neck supple.   Cardiovascular: Normal rate, regular rhythm, normal heart sounds Pulmonary/Chest: Effort normal and breath sounds normal. No respiratory distress. No wheezes. No rales.  Abdominal: Soft. Bowel sounds are normal. Exhibits no distension and no mass. There is no tenderness.  Musculoskeletal: Normal range of motion. Exhibits no edema.  Lymphadenopathy: No cervical adenopathy.  Neurological: Alert and oriented to person, place, and time. Exhibits normal muscle tone. Gait normal. Coordination normal.  Skin: Skin is warm and dry. No rash noted. Not diaphoretic. No erythema. No pallor.  Psychiatric: Mood, memory and judgment normal.  Breast: S/P left mastectomy with implant. No palpable breast masses or adenopathy appreciated.    LAB RESULTS: Lab Results  Component Value Date   WBC 4.4 05/05/2022   NEUTROABS 2.6 05/05/2022   HGB 12.9 05/05/2022   HCT 38.9 05/05/2022   MCV 82.1 05/05/2022   PLT 289 05/05/2022      Chemistry      Component Value Date/Time   NA 137 05/05/2022 1230   NA 140 04/22/2017 1327   K 3.9 05/05/2022 1230   K 4.0 04/22/2017 1327   CL 104 05/05/2022 1230   CL 105 09/08/2012 1320   CO2 27 05/05/2022 1230   CO2 25 04/22/2017 1327   BUN 10 05/05/2022 1230   BUN 15.0 04/22/2017 1327   CREATININE 0.76 05/05/2022 1230   CREATININE 0.8 04/22/2017 1327      Component Value Date/Time   CALCIUM 9.4 05/05/2022 1230   CALCIUM 9.0 04/22/2017 1327   ALKPHOS 54 05/05/2022 1230    ALKPHOS 58 04/22/2017 1327   AST 12 (L) 05/05/2022 1230   AST 13 04/22/2017 1327   ALT 10 05/05/2022 1230   ALT 11 04/22/2017 1327   BILITOT 0.4 05/05/2022 1230   BILITOT 0.38 04/22/2017 1327      ASSESSMENT: [name] is a 66 y.o. female who presents to the clinic for a follow up for recurrent breast cancer.  1. Status post left lumpectomy and sentinel lymph node sampling August 2012 for a T1cN0, stage IA invasive ductal carcinoma of overlapping sites, grade 3, estrogen receptor 98% and progesterone receptor 13% positive, HER-2/neu amplified with a ratio of 2.3.   2. Oncotype showed a recurrence score of 43 ( "high risk"), predicting a 29% risk of recurrence if the patient's only adjuvant treatment was tamoxifen   3. received adjuvant docetaxel/ cyclophosphamide x4, completed December 2012   4. trastuzumab started 04/15/2011, completed 05/05/2012; final echo 02/18/2012  5. radiation therapy, completed July 23, 2011  6. began on tamoxifen March 2013, but never taken consistently   RECURRENT DISEASE: November 2018 7.  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  Anastrozole started 04/22/2017, discontinued January 2020, poorly tolerated  (a) [facility] 03/20/2018 shows evidence of growth  (b) exemestane started January 2020, continued with significant interruptions  (c) bone density 09/27/2019 showed a T score of -1.0.  (d) exemestane discontinued April 2021 with multiple side effects  9.  Status post left mastectomy with sentinel lymph node sampling 03/01/2019 for a pT2 pN0, stage IB  invasive ductal carcinoma, grade 2, with negative margins.  The invasive tumor was  estrogen and progesterone receptor positive, with an MIB-1 of 30%.  HER-2 was not amplified.  (a) immediate left  latissimus reconstruction with saline implant placement  10.  Genetics testing 07/17/2018 through the Common Hereditary Cancers Panel offered by [facility] found no deleterious mutations in APC, ATM, AXIN2, BARD1, BMPR1A, BRCA1, BRCA2, BRIP1, CDH1, CDKN2A (p14ARF), CDKN2A (p16INK4a), CKD4, CHEK2, CTNNA1, DICER1, EPCAM (Deletion/duplication testing only), GREM1 (promoter region deletion/duplication testing only), KIT, MEN1, MLH1, MSH2, MSH3, MSH6, MUTYH, NBN, NF1, NHTL1, PALB2, PDGFRA, PMS2, POLD1, POLE, PTEN, RAD50, RAD51C, RAD51D, SDHB, SDHC, SDHD, SMAD4, SMARCA4. STK11, TP53, TSC1, TSC2, and VHL.  The following genes were evaluated for sequence changes only: SDHA and HOXB13 c.251G>A variant only  11.  Fulvestrant started 08/28/2019, repeated every 28 days--to be continued through December 2024   PLAN: -Patient returns for a follow up while on Fulvestrant q 28 days. Labs from today reviewed and require no intervention. Recommend to continue until December 2024.  -Most recent mammogram from 10/16/2021 reviewed and was negative for malignancy. Next due in one year around June 2024. I will place order today.  -Most recent bone density scan from 11/05/2021 reviewed and results show T-score -1.1 consistent with osteopenia. Recommend to take calcium/vitamin D supplementation with weightbearing exercises. She is scheduled for consultation with Ortho next week. Next bone density due in 2 years around June 2025. -RTC in 6 months with labs and f/u visit with Dr. Chryl Heck  Patient expressed understanding and satisfaction with the plan provided.   I have spent a total of 30 minutes minutes of face-to-face and non-face-to-face time, preparing to see the [facility] a medically appropriate examination, counseling and educating the patient, ordering tests/procedures,documenting clinical information in the electronic health record,and care coordination.    Dede Query PA-C Dept of Hematology and [place] at [facility] Phone: [phone]

## 2022-05-06 ENCOUNTER — Telehealth: Payer: Self-pay | Admitting: Hematology and Oncology

## 2022-05-06 NOTE — Telephone Encounter (Signed)
Called patient to notify of new appointments. Left voicemail with appointment information.  

## 2022-05-12 ENCOUNTER — Ambulatory Visit (INDEPENDENT_AMBULATORY_CARE_PROVIDER_SITE_OTHER): Payer: Medicare PPO

## 2022-05-12 ENCOUNTER — Ambulatory Visit (INDEPENDENT_AMBULATORY_CARE_PROVIDER_SITE_OTHER): Payer: Medicare PPO | Admitting: Orthopaedic Surgery

## 2022-05-12 ENCOUNTER — Encounter: Payer: Self-pay | Admitting: Orthopaedic Surgery

## 2022-05-12 VITALS — Ht 60.0 in | Wt 208.0 lb

## 2022-05-12 DIAGNOSIS — M25551 Pain in right hip: Secondary | ICD-10-CM | POA: Diagnosis not present

## 2022-05-12 DIAGNOSIS — M79671 Pain in right foot: Secondary | ICD-10-CM

## 2022-05-12 NOTE — Progress Notes (Signed)
The patient comes in today with 2 different issues.  She has been dealing with right hip pain recently but she points to the low back and sciatic region as a source of her pain.  She is also had right foot pain for some time now.  She is someone who is a BMI of 40.62.  She is not a diabetic.  She does wear appropriate shoes that are wider.  She has a known bunion deformity.  Recently when she had walked about 15,000 steps she was having midfoot pain so she put her in a cam walking boot.  This caused her to have more hip pain so she had to stop wearing the boot.  She does have monthly injections in her glute at region for cancer treatments.  This is once monthly.  On exam her right hip and left hip moves smoothly normally with no pain at all.  She has a little bit of pain over the trochanteric area but most of her pain is in the gluteus area and I believe this is associated with where she is having her injections.  Her right foot does show midfoot pain.  She has a significant bunion deformity of her great toe as well.  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  An AP pelvis and lateral right hip shows normal-appearing hip joint and no acute findings.  I do believe that her foot pain is certainly the source of some of her hip pain because of how she walks.  She understands that weight loss is certainly going to help but I have recommended Voltaren gel for her midfoot as well as having her feet measured and is appropriate fitting shoes as possible placed and even considering rigid inserts.  She will go to the [facility] per my recommendation and she agrees with this treatment plan.  All question concerns were answered and addressed.

## 2022-05-19 ENCOUNTER — Encounter: Payer: Self-pay | Admitting: Oncology

## 2022-05-27 ENCOUNTER — Telehealth: Payer: Self-pay | Admitting: Hematology and Oncology

## 2022-05-27 ENCOUNTER — Telehealth: Payer: Self-pay | Admitting: *Deleted

## 2022-05-27 NOTE — Telephone Encounter (Signed)
-----  Message from Benay Pike, MD sent at 05/27/2022  8:11 AM EST ----- I have the bone density. This showed osteopenia. Can you talk to her about weight bearing exercises, vit D supplementation and calcium rich foods.

## 2022-05-27 NOTE — Telephone Encounter (Signed)
Patient called to schedule injections per treatment with Dr. Chryl Heck. Scheduled out until next MD visit in June. Patient notified.

## 2022-05-27 NOTE — Telephone Encounter (Signed)
Per Dr.Iruku, called pt with message below. Pt verbalized understanding and importance of vitamin D intake and calcium supplementation

## 2022-06-02 ENCOUNTER — Inpatient Hospital Stay: Payer: Medicare PPO | Attending: Oncology

## 2022-06-02 ENCOUNTER — Other Ambulatory Visit: Payer: Self-pay

## 2022-06-02 VITALS — BP 153/77 | HR 76 | Temp 99.5°F | Resp 18

## 2022-06-02 DIAGNOSIS — Z170 Estrogen receptor positive status [ER+]: Secondary | ICD-10-CM | POA: Insufficient documentation

## 2022-06-02 DIAGNOSIS — Z5111 Encounter for antineoplastic chemotherapy: Secondary | ICD-10-CM | POA: Insufficient documentation

## 2022-06-02 DIAGNOSIS — C50812 Malignant neoplasm of overlapping sites of left female breast: Secondary | ICD-10-CM | POA: Insufficient documentation

## 2022-06-02 MED ORDER — FULVESTRANT 250 MG/5ML IM SOSY
500.0000 mg | PREFILLED_SYRINGE | Freq: Once | INTRAMUSCULAR | Status: AC
  Administered 2022-06-02: 500 mg via INTRAMUSCULAR

## 2022-06-30 ENCOUNTER — Inpatient Hospital Stay: Payer: Medicare PPO | Attending: Oncology

## 2022-06-30 VITALS — BP 136/84 | HR 86 | Temp 98.9°F | Resp 18

## 2022-06-30 DIAGNOSIS — C50812 Malignant neoplasm of overlapping sites of left female breast: Secondary | ICD-10-CM | POA: Diagnosis not present

## 2022-06-30 DIAGNOSIS — Z5111 Encounter for antineoplastic chemotherapy: Secondary | ICD-10-CM | POA: Diagnosis present

## 2022-06-30 DIAGNOSIS — Z170 Estrogen receptor positive status [ER+]: Secondary | ICD-10-CM | POA: Insufficient documentation

## 2022-06-30 MED ORDER — FULVESTRANT 250 MG/5ML IM SOSY
500.0000 mg | PREFILLED_SYRINGE | Freq: Once | INTRAMUSCULAR | Status: AC
  Administered 2022-06-30: 500 mg via INTRAMUSCULAR
  Filled 2022-06-30: qty 10

## 2022-07-28 ENCOUNTER — Inpatient Hospital Stay: Payer: Medicare PPO | Attending: Oncology

## 2022-07-28 ENCOUNTER — Other Ambulatory Visit: Payer: Self-pay

## 2022-07-28 VITALS — BP 140/79 | HR 79 | Temp 99.1°F | Resp 18

## 2022-07-28 DIAGNOSIS — Z5111 Encounter for antineoplastic chemotherapy: Secondary | ICD-10-CM | POA: Insufficient documentation

## 2022-07-28 DIAGNOSIS — Z170 Estrogen receptor positive status [ER+]: Secondary | ICD-10-CM | POA: Diagnosis not present

## 2022-07-28 DIAGNOSIS — C50812 Malignant neoplasm of overlapping sites of left female breast: Secondary | ICD-10-CM

## 2022-07-28 MED ORDER — FULVESTRANT 250 MG/5ML IM SOSY
500.0000 mg | PREFILLED_SYRINGE | Freq: Once | INTRAMUSCULAR | Status: AC
  Administered 2022-07-28: 500 mg via INTRAMUSCULAR
  Filled 2022-07-28: qty 10

## 2022-07-28 NOTE — Patient Instructions (Signed)

## 2022-08-04 DIAGNOSIS — Z87891 Personal history of nicotine dependence: Secondary | ICD-10-CM | POA: Diagnosis not present

## 2022-08-04 DIAGNOSIS — E119 Type 2 diabetes mellitus without complications: Secondary | ICD-10-CM | POA: Diagnosis not present

## 2022-08-04 DIAGNOSIS — Z7984 Long term (current) use of oral hypoglycemic drugs: Secondary | ICD-10-CM | POA: Diagnosis not present

## 2022-08-04 DIAGNOSIS — Z5948 Other specified lack of adequate food: Secondary | ICD-10-CM | POA: Diagnosis not present

## 2022-08-04 DIAGNOSIS — Z6841 Body Mass Index (BMI) 40.0 and over, adult: Secondary | ICD-10-CM | POA: Diagnosis not present

## 2022-08-04 DIAGNOSIS — C7981 Secondary malignant neoplasm of breast: Secondary | ICD-10-CM | POA: Diagnosis not present

## 2022-08-04 DIAGNOSIS — Z7985 Long-term (current) use of injectable non-insulin antidiabetic drugs: Secondary | ICD-10-CM | POA: Diagnosis not present

## 2022-08-04 DIAGNOSIS — G4730 Sleep apnea, unspecified: Secondary | ICD-10-CM | POA: Diagnosis not present

## 2022-08-04 DIAGNOSIS — R030 Elevated blood-pressure reading, without diagnosis of hypertension: Secondary | ICD-10-CM | POA: Diagnosis not present

## 2022-08-11 DIAGNOSIS — G4733 Obstructive sleep apnea (adult) (pediatric): Secondary | ICD-10-CM | POA: Diagnosis not present

## 2022-08-17 DIAGNOSIS — R7301 Impaired fasting glucose: Secondary | ICD-10-CM | POA: Diagnosis not present

## 2022-08-17 DIAGNOSIS — I10 Essential (primary) hypertension: Secondary | ICD-10-CM | POA: Diagnosis not present

## 2022-08-17 DIAGNOSIS — E785 Hyperlipidemia, unspecified: Secondary | ICD-10-CM | POA: Diagnosis not present

## 2022-08-17 DIAGNOSIS — G4733 Obstructive sleep apnea (adult) (pediatric): Secondary | ICD-10-CM | POA: Diagnosis not present

## 2022-08-25 ENCOUNTER — Inpatient Hospital Stay: Payer: Medicare PPO | Attending: Oncology

## 2022-08-25 VITALS — BP 132/85 | HR 82 | Temp 98.3°F | Resp 18

## 2022-08-25 DIAGNOSIS — Z5111 Encounter for antineoplastic chemotherapy: Secondary | ICD-10-CM | POA: Diagnosis not present

## 2022-08-25 DIAGNOSIS — Z170 Estrogen receptor positive status [ER+]: Secondary | ICD-10-CM

## 2022-08-25 DIAGNOSIS — C50812 Malignant neoplasm of overlapping sites of left female breast: Secondary | ICD-10-CM | POA: Insufficient documentation

## 2022-08-25 MED ORDER — FULVESTRANT 250 MG/5ML IM SOSY
500.0000 mg | PREFILLED_SYRINGE | Freq: Once | INTRAMUSCULAR | Status: AC
  Administered 2022-08-25: 500 mg via INTRAMUSCULAR
  Filled 2022-08-25: qty 10

## 2022-08-25 NOTE — Patient Instructions (Signed)

## 2022-09-17 ENCOUNTER — Telehealth: Payer: Self-pay | Admitting: Hematology and Oncology

## 2022-09-22 ENCOUNTER — Inpatient Hospital Stay: Payer: Medicare PPO | Attending: Oncology

## 2022-09-22 VITALS — BP 145/83 | HR 83 | Resp 18

## 2022-09-22 DIAGNOSIS — C50812 Malignant neoplasm of overlapping sites of left female breast: Secondary | ICD-10-CM | POA: Diagnosis not present

## 2022-09-22 DIAGNOSIS — Z170 Estrogen receptor positive status [ER+]: Secondary | ICD-10-CM | POA: Diagnosis not present

## 2022-09-22 DIAGNOSIS — Z5111 Encounter for antineoplastic chemotherapy: Secondary | ICD-10-CM | POA: Diagnosis not present

## 2022-09-22 MED ORDER — FULVESTRANT 250 MG/5ML IM SOSY
500.0000 mg | PREFILLED_SYRINGE | Freq: Once | INTRAMUSCULAR | Status: AC
  Administered 2022-09-22: 500 mg via INTRAMUSCULAR
  Filled 2022-09-22: qty 10

## 2022-10-16 LAB — AMB RESULTS CONSOLE CBG: Glucose: 101

## 2022-10-16 NOTE — Progress Notes (Signed)
Pt may need PCP. No SDOH needs at this time

## 2022-10-19 ENCOUNTER — Other Ambulatory Visit: Payer: Self-pay

## 2022-10-19 DIAGNOSIS — C50812 Malignant neoplasm of overlapping sites of left female breast: Secondary | ICD-10-CM

## 2022-10-20 ENCOUNTER — Encounter: Payer: Self-pay | Admitting: Hematology and Oncology

## 2022-10-20 ENCOUNTER — Inpatient Hospital Stay: Payer: Medicare PPO

## 2022-10-20 ENCOUNTER — Inpatient Hospital Stay: Payer: Medicare PPO | Attending: Oncology

## 2022-10-20 ENCOUNTER — Inpatient Hospital Stay: Payer: Medicare PPO | Admitting: Hematology and Oncology

## 2022-10-20 VITALS — BP 140/77 | HR 71 | Temp 97.5°F | Resp 18 | Ht 60.0 in | Wt 211.8 lb

## 2022-10-20 DIAGNOSIS — C50812 Malignant neoplasm of overlapping sites of left female breast: Secondary | ICD-10-CM | POA: Insufficient documentation

## 2022-10-20 DIAGNOSIS — Z5111 Encounter for antineoplastic chemotherapy: Secondary | ICD-10-CM | POA: Diagnosis not present

## 2022-10-20 DIAGNOSIS — Z87891 Personal history of nicotine dependence: Secondary | ICD-10-CM | POA: Insufficient documentation

## 2022-10-20 DIAGNOSIS — Z170 Estrogen receptor positive status [ER+]: Secondary | ICD-10-CM

## 2022-10-20 DIAGNOSIS — M8580 Other specified disorders of bone density and structure, unspecified site: Secondary | ICD-10-CM | POA: Insufficient documentation

## 2022-10-20 DIAGNOSIS — Z79810 Long term (current) use of selective estrogen receptor modulators (SERMs): Secondary | ICD-10-CM | POA: Diagnosis not present

## 2022-10-20 DIAGNOSIS — Z923 Personal history of irradiation: Secondary | ICD-10-CM | POA: Diagnosis not present

## 2022-10-20 LAB — CBC WITH DIFFERENTIAL (CANCER CENTER ONLY)
Abs Immature Granulocytes: 0.01 10*3/uL (ref 0.00–0.07)
Basophils Absolute: 0 10*3/uL (ref 0.0–0.1)
Basophils Relative: 0 %
Eosinophils Absolute: 0.1 10*3/uL (ref 0.0–0.5)
Eosinophils Relative: 1 %
HCT: 37.4 % (ref 36.0–46.0)
Hemoglobin: 12.1 g/dL (ref 12.0–15.0)
Immature Granulocytes: 0 %
Lymphocytes Relative: 26 %
Lymphs Abs: 1.3 10*3/uL (ref 0.7–4.0)
MCH: 27 pg (ref 26.0–34.0)
MCHC: 32.4 g/dL (ref 30.0–36.0)
MCV: 83.5 fL (ref 80.0–100.0)
Monocytes Absolute: 0.4 10*3/uL (ref 0.1–1.0)
Monocytes Relative: 7 %
Neutro Abs: 3.3 10*3/uL (ref 1.7–7.7)
Neutrophils Relative %: 66 %
Platelet Count: 263 10*3/uL (ref 150–400)
RBC: 4.48 MIL/uL (ref 3.87–5.11)
RDW: 14 % (ref 11.5–15.5)
WBC Count: 5.1 10*3/uL (ref 4.0–10.5)
nRBC: 0 % (ref 0.0–0.2)

## 2022-10-20 LAB — CMP (CANCER CENTER ONLY)
ALT: 9 U/L (ref 0–44)
AST: 12 U/L — ABNORMAL LOW (ref 15–41)
Albumin: 3.9 g/dL (ref 3.5–5.0)
Alkaline Phosphatase: 52 U/L (ref 38–126)
Anion gap: 8 (ref 5–15)
BUN: 18 mg/dL (ref 8–23)
CO2: 24 mmol/L (ref 22–32)
Calcium: 9.2 mg/dL (ref 8.9–10.3)
Chloride: 108 mmol/L (ref 98–111)
Creatinine: 0.65 mg/dL (ref 0.44–1.00)
GFR, Estimated: >60 mL/min (ref >60)
Glucose, Bld: 103 mg/dL — ABNORMAL HIGH (ref 70–99)
Potassium: 3.5 mmol/L (ref 3.5–5.1)
Sodium: 140 mmol/L (ref 135–145)
Total Bilirubin: 0.4 mg/dL (ref 0.3–1.2)
Total Protein: 7.3 g/dL (ref 6.5–8.1)

## 2022-10-20 MED ORDER — FULVESTRANT 250 MG/5ML IM SOSY
500.0000 mg | PREFILLED_SYRINGE | Freq: Once | INTRAMUSCULAR | Status: AC
  Administered 2022-10-20: 500 mg via INTRAMUSCULAR
  Filled 2022-10-20: qty 10

## 2022-10-20 NOTE — Progress Notes (Signed)
[facility]  Telephone:(336) [phone] Fax:(336) [phone]   ID: [name]   DOB: 10/06/1955  MR#: [id number]  JYN#:[id number]  Patient Care Team: Cleatis Polka., MD as PCP - General (Internal Medicine) Etter Sjogren, MD as Consulting Physician (Plastic Surgery) Rachel Moulds, MD as Consulting Physician (Hematology and Oncology)   CHIEF COMPLAINT:  Left Breast Cancer (s/p left mastectomy)  CURRENT TREATMENT: Fulvestrant  INTERVAL HISTORY:  [name] returns for a follow up for recurrent left breast cancer. She is unaccompanied for this visit.  She continues on fulvestrant every 4 weeks.  She reports some constipation, abnormal taste after Faslodex injection and some ongoing right hamstring pain especially when she moves it a certain way.  She otherwise has been tolerating it remarkably well.  She was hopeful that we will discontinue the medication sooner but she is willing to continue it till December like Dr. Darnelle Catalan requested.  She is exercising, her diabetes is very well-controlled.  She is not very satisfied with the cosmetic outcome of the surgery but is not willing to go through more surgeries.  She denies any breast changes otherwise.  No change in breathing, bowel habits or urinary habits.  No new neurological complaints.  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   Family history of breast cancer 06/23/2018   Family history of prostate cancer 06/23/2018   Gum disease    Hyperlipidemia    diet and exercise controlled, no med   Hypertension    Pre-diabetes    borderline - diet and exercise contolled, no meds   Sleep apnea    uses CPAP nightly   Urinary incontinence     PAST SURGICAL HISTORY: Past Surgical  History:  Procedure Laterality Date   BREAST LUMPECTOMY  2012   left   COLONOSCOPY     FOOT MASS EXCISION     LAPAROSCOPIC GASTRIC BANDING     LATISSIMUS FLAP TO BREAST Left 03/01/2019   Procedure: LEFT BREAST LATIISSIMUS FLAP;  Surgeon: Etter Sjogren, MD;  Location: [facility] OR;  Service: Plastics;  Laterality: Left;   MASS EXCISION     back   MASTECTOMY W/ SENTINEL NODE BIOPSY Left 03/01/2019   Procedure: LEFT MASTECTOMY WITH LEFT SENTINEL LYMPH NODE BIOPSY;  Surgeon: Ovidio Kin, MD;  Location: [facility] OR;  Service: General;  Laterality: Left;   PLACEMENT OF BREAST IMPLANTS Left 03/01/2019   Procedure: PLACEMENT OF SALINE BREAST IMPLANT;  Surgeon: Etter Sjogren, MD;  Location: [facility] OR;  Service: Plastics;  Laterality: Left;   PORTACATH PLACEMENT  02/11/11   PORTACATH REMOVAL  MARCH 2014   RESECTOSCOPIC POLYPECTOMY  2001   MYOMECTOMY   SOFT TISSUE CYST EXCISION     back cyst   VAGINAL MASS EXCISION      FAMILY HISTORY Family History  Problem Relation Age of Onset   Cancer Maternal Uncle 57       prostate, metastatic   Alcohol abuse Father    Cirrhosis Father    Breast cancer Mother 63       Stage 1   Hypertension Mother     SOCIAL HISTORY:  Social History   Socioeconomic History   Marital status: Divorced  Spouse name: Not on file   Number of children: Not on file   Years of education: Not on file   Highest education level: Not on file  Occupational History   Not on file  Tobacco Use   Smoking status: Former    Packs/day: 1.00    Years: 21.00    Additional pack years: 0.00    Total pack years: 21.00    Types: Cigarettes    Quit date: 03/25/1999    Years since quitting: 23.5   Smokeless tobacco: Never  Vaping Use   Vaping Use: Never used  Substance and Sexual Activity   Alcohol use: Yes    Alcohol/week: 7.0 standard drinks of alcohol    Types: 7 Glasses of wine per week    Comment: red wine   Drug use: No   Sexual activity: Yes    Birth control/protection:  Post-menopausal  Other Topics Concern   Not on file  Social History Narrative   Not on file   Social Determinants of Health   Financial Resource Strain: Not on file  Food Insecurity: No Food Insecurity (10/16/2022)   Hunger Vital Sign    Worried About Running Out of Food in the Last Year: Never true    Ran Out of Food in the Last Year: Never true  Transportation Needs: No Transportation Needs (10/16/2022)   PRAPARE - Administrator, Civil Service (Medical): No    Lack of Transportation (Non-Medical): No  Physical Activity: Not on file  Stress: Not on file  Social Connections: Not on file     HEALTH MAINTENANCE: Social History   Tobacco Use   Smoking status: Former    Packs/day: 1.00    Years: 21.00    Additional pack years: 0.00    Total pack years: 21.00    Types: Cigarettes    Quit date: 03/25/1999    Years since quitting: 23.5   Smokeless tobacco: Never  Vaping Use   Vaping Use: Never used  Substance Use Topics   Alcohol use: Yes    Alcohol/week: 7.0 standard drinks of alcohol    Types: 7 Glasses of wine per week    Comment: red wine   Drug use: No    Allergies  Allergen Reactions   Lisinopril Cough    Current Outpatient Medications  Medication Sig Dispense Refill   amLODipine-olmesartan (AZOR) 10-40 MG per tablet Take 1 tablet by mouth daily. (Patient taking differently: Take 1 tablet by mouth at bedtime.) 30 tablet 6   calcium carbonate (OS-CAL) 1250 (500 Ca) MG chewable tablet Chew by mouth. (Patient not taking: Reported on 05/05/2022)     cholecalciferol (VITAMIN D3) 25 MCG (1000 UNIT) tablet 1 tablet Orally Once a day     ELDERBERRY PO Take 1 tablet by mouth as needed.     metFORMIN (GLUCOPHAGE-XR) 500 MG 24 hr tablet Take 500 mg by mouth 2 (two) times daily.     Multiple Vitamin (MULTIVITAMIN) capsule Take 1 capsule by mouth daily.     OZEMPIC, 1 MG/DOSE, 4 MG/3ML SOPN Inject 1 mg into the skin once a week.     solifenacin (VESICARE) 5 MG  tablet Take 5 mg by mouth daily.     No current facility-administered medications for this visit.    PHYSICAL EXAM: Vitals:   10/20/22 1346  BP: (!) 140/77  Pulse: 71  Resp: 18  Temp: (!) 97.5 F (36.4 C)  SpO2: 98%     Body mass index is  41.36 kg/m.    ECOG FS: 1 Filed Weights   10/20/22 1346  Weight: 211 lb 12.8 oz (96.1 kg)    Constitutional: Oriented to person, place, and time and well-developed, well-nourished, and in no distress.  HENT:  Head: Normocephalic and atraumatic.  Eyes: Conjunctivae are normal. Right eye exhibits no discharge. Left eye exhibits no discharge. No scleral icterus.  Neck: Normal range of motion. Neck supple.   Chest: Clear to auscultation bilaterally Breast: S/P left mastectomy with implant. No palpable breast masses or adenopathy appreciated.    LAB RESULTS: Lab Results  Component Value Date   WBC 4.4 05/05/2022   NEUTROABS 2.6 05/05/2022   HGB 12.9 05/05/2022   HCT 38.9 05/05/2022   MCV 82.1 05/05/2022   PLT 289 05/05/2022      Chemistry      Component Value Date/Time   NA 140 10/20/2022 1306   NA 140 04/22/2017 1327   K 3.5 10/20/2022 1306   K 4.0 04/22/2017 1327   CL 108 10/20/2022 1306   CL 105 09/08/2012 1320   CO2 24 10/20/2022 1306   CO2 25 04/22/2017 1327   BUN 18 10/20/2022 1306   BUN 15.0 04/22/2017 1327   CREATININE 0.65 10/20/2022 1306   CREATININE 0.8 04/22/2017 1327      Component Value Date/Time   CALCIUM 9.2 10/20/2022 1306   CALCIUM 9.0 04/22/2017 1327   ALKPHOS 52 10/20/2022 1306   ALKPHOS 58 04/22/2017 1327   AST 12 (L) 10/20/2022 1306   AST 13 04/22/2017 1327   ALT 9 10/20/2022 1306   ALT 11 04/22/2017 1327   BILITOT 0.4 10/20/2022 1306   BILITOT 0.38 04/22/2017 1327      ASSESSMENT: [name] is a 67 y.o. female who presents to the clinic for a follow up for recurrent breast cancer.  1. 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   2. Oncotype showed a recurrence score of 43 ( "high risk"), predicting a 29% risk of recurrence if the patient's only adjuvant treatment was tamoxifen   3. received adjuvant docetaxel/ cyclophosphamide x4, completed December 2012   4. trastuzumab started 04/15/2011, completed 05/05/2012; final echo 02/18/2012  5. radiation therapy, completed July 23, 2011  6. began on tamoxifen March 2013, but never taken consistently   RECURRENT DISEASE: November 2018 7.  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  Anastrozole started 04/22/2017, discontinued January 2020, poorly tolerated  (a) mammography at [facility] 03/20/2018 shows evidence of growth  (b) exemestane started January 2020, continued with significant interruptions  (c) bone density 09/27/2019 showed a T score of -1.0.  (d) exemestane discontinued April 2021 with multiple side effects  9.  Status post left mastectomy with sentinel lymph node sampling 03/01/2019 for a pT2 pN0, stage IB  invasive ductal carcinoma, grade 2, with negative margins.  The invasive tumor was estrogen and progesterone receptor positive, with an MIB-1 of 30%.  HER-2 was not amplified.  (a) immediate left latissimus reconstruction with saline implant placement  10.  Genetics testing 07/17/2018 through the Common Hereditary Cancers Panel offered by [facility] found no deleterious mutations in APC, ATM, AXIN2, BARD1, BMPR1A,  BRCA1, BRCA2, BRIP1, CDH1, CDKN2A (p14ARF), CDKN2A (p16INK4a), CKD4, CHEK2, CTNNA1, DICER1, EPCAM (Deletion/duplication testing only), GREM1 (promoter region deletion/duplication testing only), KIT, MEN1, MLH1, MSH2,  MSH3, MSH6, MUTYH, NBN, NF1, NHTL1, PALB2, PDGFRA, PMS2, POLD1, POLE, PTEN, RAD50, RAD51C, RAD51D, SDHB, SDHC, SDHD, SMAD4, SMARCA4. STK11, TP53, TSC1, TSC2, and VHL.  The following genes were evaluated for sequence changes only: SDHA and HOXB13 c.251G>A variant only  11.  Fulvestrant started 08/28/2019, repeated every 28 days--to be continued through December 2024   PLAN:  -Patient returns for a follow up while on Fulvestrant q 28 days. Recommend to continue until December 2024.  -Most recent mammogram from 10/16/2021 reviewed and was negative for malignancy. Next due in one year around June 2024.  She says this is already scheduled -Most recent bone density scan from 11/05/2021 reviewed and results show T-score -1.1 consistent with osteopenia. Recommend to take calcium/vitamin D supplementation with weightbearing exercises.Next bone density due in 2 years around June 2025. -RTC in 6 months with labs and f/u visit   Patient expressed understanding and satisfaction with the plan provided.   I have spent a total of 30 minutes minutes of face-to-face and non-face-to-face time, preparing to see the patient,performing a medically appropriate examination, counseling and educating the patient, ordering tests/procedures,documenting clinical information in the electronic health record,and care coordination.    Georga Kaufmann PA-C Dept of Hematology and Oncology [facility] at [facility] Phone: [phone]

## 2022-10-29 DIAGNOSIS — Z1231 Encounter for screening mammogram for malignant neoplasm of breast: Secondary | ICD-10-CM | POA: Diagnosis not present

## 2022-11-01 ENCOUNTER — Other Ambulatory Visit: Payer: Medicare PPO

## 2022-11-01 ENCOUNTER — Ambulatory Visit: Payer: Medicare PPO | Admitting: Hematology and Oncology

## 2022-11-17 ENCOUNTER — Inpatient Hospital Stay: Payer: Medicare PPO | Attending: Oncology

## 2022-11-17 ENCOUNTER — Other Ambulatory Visit: Payer: Self-pay

## 2022-11-17 VITALS — BP 149/77 | HR 71 | Temp 98.7°F | Resp 18

## 2022-11-17 DIAGNOSIS — D0512 Intraductal carcinoma in situ of left breast: Secondary | ICD-10-CM | POA: Diagnosis not present

## 2022-11-17 DIAGNOSIS — Z5111 Encounter for antineoplastic chemotherapy: Secondary | ICD-10-CM | POA: Insufficient documentation

## 2022-11-17 DIAGNOSIS — Z170 Estrogen receptor positive status [ER+]: Secondary | ICD-10-CM

## 2022-11-17 MED ORDER — FULVESTRANT 250 MG/5ML IM SOSY
500.0000 mg | PREFILLED_SYRINGE | Freq: Once | INTRAMUSCULAR | Status: AC
  Administered 2022-11-17: 500 mg via INTRAMUSCULAR
  Filled 2022-11-17: qty 10

## 2022-11-18 ENCOUNTER — Encounter: Payer: Self-pay | Admitting: *Deleted

## 2022-11-18 NOTE — Progress Notes (Signed)
Pt attended 10/16/22 screening event where her b/p was 131/84 and her blood sugar was 101. At the event, the pt did not identify her PCP and did not note any SDOH insecurities. Chart review indicates pt's PCP is Dr Martha Clan at [facility]; and pt's last ov at her PCP office was on 08/17/22.and she has received ongoing Rx refills as recently as 11/02/22 by Dr. Clelia Croft. Chart review also indicates that pt is under ongoing oncology care at [facility]. No additional health equity team support indicated at this time.

## 2022-12-15 ENCOUNTER — Inpatient Hospital Stay: Payer: Medicare PPO | Attending: Oncology

## 2022-12-15 ENCOUNTER — Other Ambulatory Visit: Payer: Self-pay

## 2022-12-15 VITALS — BP 145/77 | HR 77 | Temp 98.7°F | Resp 18

## 2022-12-15 DIAGNOSIS — C50812 Malignant neoplasm of overlapping sites of left female breast: Secondary | ICD-10-CM | POA: Diagnosis not present

## 2022-12-15 DIAGNOSIS — Z170 Estrogen receptor positive status [ER+]: Secondary | ICD-10-CM | POA: Insufficient documentation

## 2022-12-15 DIAGNOSIS — Z5111 Encounter for antineoplastic chemotherapy: Secondary | ICD-10-CM | POA: Insufficient documentation

## 2022-12-15 MED ORDER — FULVESTRANT 250 MG/5ML IM SOSY
500.0000 mg | PREFILLED_SYRINGE | Freq: Once | INTRAMUSCULAR | Status: AC
  Administered 2022-12-15: 500 mg via INTRAMUSCULAR
  Filled 2022-12-15: qty 10

## 2023-01-12 ENCOUNTER — Inpatient Hospital Stay: Payer: Medicare PPO | Attending: Oncology

## 2023-01-12 VITALS — BP 148/74 | HR 70 | Temp 98.7°F | Resp 18

## 2023-01-12 DIAGNOSIS — Z170 Estrogen receptor positive status [ER+]: Secondary | ICD-10-CM | POA: Insufficient documentation

## 2023-01-12 DIAGNOSIS — C50812 Malignant neoplasm of overlapping sites of left female breast: Secondary | ICD-10-CM | POA: Insufficient documentation

## 2023-01-12 DIAGNOSIS — Z5111 Encounter for antineoplastic chemotherapy: Secondary | ICD-10-CM | POA: Diagnosis not present

## 2023-01-12 MED ORDER — FULVESTRANT 250 MG/5ML IM SOSY
500.0000 mg | PREFILLED_SYRINGE | Freq: Once | INTRAMUSCULAR | Status: AC
  Administered 2023-01-12: 500 mg via INTRAMUSCULAR
  Filled 2023-01-12: qty 10

## 2023-02-09 ENCOUNTER — Inpatient Hospital Stay: Payer: Medicare PPO | Attending: Oncology

## 2023-02-09 VITALS — BP 140/78 | HR 76 | Temp 98.4°F | Resp 18

## 2023-02-09 DIAGNOSIS — C50812 Malignant neoplasm of overlapping sites of left female breast: Secondary | ICD-10-CM | POA: Insufficient documentation

## 2023-02-09 DIAGNOSIS — Z170 Estrogen receptor positive status [ER+]: Secondary | ICD-10-CM | POA: Diagnosis not present

## 2023-02-09 DIAGNOSIS — Z5111 Encounter for antineoplastic chemotherapy: Secondary | ICD-10-CM | POA: Insufficient documentation

## 2023-02-09 MED ORDER — FULVESTRANT 250 MG/5ML IM SOSY
500.0000 mg | PREFILLED_SYRINGE | Freq: Once | INTRAMUSCULAR | Status: AC
  Administered 2023-02-09: 500 mg via INTRAMUSCULAR
  Filled 2023-02-09: qty 10

## 2023-02-15 DIAGNOSIS — R7301 Impaired fasting glucose: Secondary | ICD-10-CM | POA: Diagnosis not present

## 2023-02-15 DIAGNOSIS — E785 Hyperlipidemia, unspecified: Secondary | ICD-10-CM | POA: Diagnosis not present

## 2023-02-15 DIAGNOSIS — Z7189 Other specified counseling: Secondary | ICD-10-CM | POA: Diagnosis not present

## 2023-02-15 DIAGNOSIS — I10 Essential (primary) hypertension: Secondary | ICD-10-CM | POA: Diagnosis not present

## 2023-02-15 DIAGNOSIS — E669 Obesity, unspecified: Secondary | ICD-10-CM | POA: Diagnosis not present

## 2023-02-15 DIAGNOSIS — G4733 Obstructive sleep apnea (adult) (pediatric): Secondary | ICD-10-CM | POA: Diagnosis not present

## 2023-02-16 DIAGNOSIS — G4733 Obstructive sleep apnea (adult) (pediatric): Secondary | ICD-10-CM | POA: Diagnosis not present

## 2023-03-11 ENCOUNTER — Inpatient Hospital Stay: Payer: Medicare PPO | Attending: Oncology

## 2023-03-11 DIAGNOSIS — Z5111 Encounter for antineoplastic chemotherapy: Secondary | ICD-10-CM | POA: Diagnosis not present

## 2023-03-11 DIAGNOSIS — Z170 Estrogen receptor positive status [ER+]: Secondary | ICD-10-CM | POA: Insufficient documentation

## 2023-03-11 DIAGNOSIS — C50812 Malignant neoplasm of overlapping sites of left female breast: Secondary | ICD-10-CM | POA: Diagnosis not present

## 2023-03-11 MED ORDER — FULVESTRANT 250 MG/5ML IM SOSY
500.0000 mg | PREFILLED_SYRINGE | Freq: Once | INTRAMUSCULAR | Status: AC
  Administered 2023-03-11: 500 mg via INTRAMUSCULAR
  Filled 2023-03-11: qty 10

## 2023-04-12 ENCOUNTER — Other Ambulatory Visit: Payer: Self-pay

## 2023-04-12 DIAGNOSIS — Z170 Estrogen receptor positive status [ER+]: Secondary | ICD-10-CM

## 2023-04-13 ENCOUNTER — Other Ambulatory Visit: Payer: Self-pay

## 2023-04-13 ENCOUNTER — Inpatient Hospital Stay: Payer: Medicare PPO | Attending: Oncology | Admitting: Hematology and Oncology

## 2023-04-13 ENCOUNTER — Inpatient Hospital Stay: Payer: Medicare PPO

## 2023-04-13 VITALS — BP 144/83 | HR 84 | Temp 98.3°F | Resp 18 | Wt 215.5 lb

## 2023-04-13 DIAGNOSIS — Z87891 Personal history of nicotine dependence: Secondary | ICD-10-CM | POA: Diagnosis not present

## 2023-04-13 DIAGNOSIS — E119 Type 2 diabetes mellitus without complications: Secondary | ICD-10-CM | POA: Diagnosis not present

## 2023-04-13 DIAGNOSIS — Z1721 Progesterone receptor positive status: Secondary | ICD-10-CM | POA: Insufficient documentation

## 2023-04-13 DIAGNOSIS — Z923 Personal history of irradiation: Secondary | ICD-10-CM | POA: Diagnosis not present

## 2023-04-13 DIAGNOSIS — C50812 Malignant neoplasm of overlapping sites of left female breast: Secondary | ICD-10-CM | POA: Insufficient documentation

## 2023-04-13 DIAGNOSIS — Z170 Estrogen receptor positive status [ER+]: Secondary | ICD-10-CM

## 2023-04-13 DIAGNOSIS — Z5111 Encounter for antineoplastic chemotherapy: Secondary | ICD-10-CM | POA: Diagnosis not present

## 2023-04-13 LAB — CBC WITH DIFFERENTIAL (CANCER CENTER ONLY)
Abs Immature Granulocytes: 0.01 10*3/uL (ref 0.00–0.07)
Basophils Absolute: 0 10*3/uL (ref 0.0–0.1)
Basophils Relative: 0 %
Eosinophils Absolute: 0.1 10*3/uL (ref 0.0–0.5)
Eosinophils Relative: 1 %
HCT: 37.5 % (ref 36.0–46.0)
Hemoglobin: 12.1 g/dL (ref 12.0–15.0)
Immature Granulocytes: 0 %
Lymphocytes Relative: 26 %
Lymphs Abs: 1.3 10*3/uL (ref 0.7–4.0)
MCH: 26.5 pg (ref 26.0–34.0)
MCHC: 32.3 g/dL (ref 30.0–36.0)
MCV: 82.1 fL (ref 80.0–100.0)
Monocytes Absolute: 0.4 10*3/uL (ref 0.1–1.0)
Monocytes Relative: 7 %
Neutro Abs: 3.3 10*3/uL (ref 1.7–7.7)
Neutrophils Relative %: 66 %
Platelet Count: 299 10*3/uL (ref 150–400)
RBC: 4.57 MIL/uL (ref 3.87–5.11)
RDW: 14.2 % (ref 11.5–15.5)
WBC Count: 5.1 10*3/uL (ref 4.0–10.5)
nRBC: 0 % (ref 0.0–0.2)

## 2023-04-13 LAB — CMP (CANCER CENTER ONLY)
ALT: 11 U/L (ref 0–44)
AST: 13 U/L — ABNORMAL LOW (ref 15–41)
Albumin: 4.1 g/dL (ref 3.5–5.0)
Alkaline Phosphatase: 57 U/L (ref 38–126)
Anion gap: 5 (ref 5–15)
BUN: 13 mg/dL (ref 8–23)
CO2: 27 mmol/L (ref 22–32)
Calcium: 9.2 mg/dL (ref 8.9–10.3)
Chloride: 106 mmol/L (ref 98–111)
Creatinine: 0.84 mg/dL (ref 0.44–1.00)
GFR, Estimated: >60 mL/min (ref >60)
Glucose, Bld: 80 mg/dL (ref 70–99)
Potassium: 3.7 mmol/L (ref 3.5–5.1)
Sodium: 138 mmol/L (ref 135–145)
Total Bilirubin: 0.4 mg/dL (ref <1.2)
Total Protein: 7 g/dL (ref 6.5–8.1)

## 2023-04-13 MED ORDER — FULVESTRANT 250 MG/5ML IM SOSY
500.0000 mg | PREFILLED_SYRINGE | Freq: Once | INTRAMUSCULAR | Status: AC
  Administered 2023-04-13: 500 mg via INTRAMUSCULAR
  Filled 2023-04-13: qty 10

## 2023-04-13 NOTE — Progress Notes (Signed)
[facility]  Telephone:(336) [phone] Fax:(336) [phone]   ID: [name]   DOB: [birth year]  MR#: [id number]  HYQ#:[id number]  Patient Care Team: Cleatis Polka., MD as PCP - General (Internal Medicine) Etter Sjogren, MD as Consulting Physician (Plastic Surgery) Rachel Moulds, MD as Consulting Physician (Hematology and Oncology)   CHIEF COMPLAINT:  Left Breast Cancer (s/p left mastectomy)  CURRENT TREATMENT: Fulvestrant  INTERVAL HISTORY: Discussed the use of AI scribe software for clinical note transcription with the patient, who gave verbal consent to proceed.  History of Present Illness    The patient, with a history of breast cancer treated with a mastectomy and reconstruction on one side, is completing a three-year course of faslodex. Prior to this she took anastrozole and exemestane for almost 2 yrs. The patient reports feeling well overall, with no new or worsening symptoms. The patient has been considering further plastic surgery for breast symmetry but has not made a decision yet. The patient has been maintaining regular health screenings, including mammograms and bone density scans. The patient is also considering getting vaccinations, including the shingles vaccine and a COVID-19 booster shot.  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   Family history of breast cancer 06/23/2018   Family history of prostate cancer 06/23/2018   Gum disease    Hyperlipidemia    diet and exercise controlled, no med   Hypertension    Pre-diabetes    borderline - diet and exercise contolled, no meds   Sleep apnea    uses CPAP nightly   Urinary incontinence     PAST SURGICAL HISTORY: Past Surgical History:  Procedure Laterality  Date   BREAST LUMPECTOMY  2012   left   COLONOSCOPY     FOOT MASS EXCISION     LAPAROSCOPIC GASTRIC BANDING     LATISSIMUS FLAP TO BREAST Left 03/01/2019   Procedure: LEFT BREAST LATIISSIMUS FLAP;  Surgeon: Etter Sjogren, MD;  Location: [facility] OR;  Service: Plastics;  Laterality: Left;   MASS EXCISION     back   MASTECTOMY W/ SENTINEL NODE BIOPSY Left 03/01/2019   Procedure: LEFT MASTECTOMY WITH LEFT SENTINEL LYMPH NODE BIOPSY;  Surgeon: Ovidio Kin, MD;  Location: [facility] OR;  Service: General;  Laterality: Left;   PLACEMENT OF BREAST IMPLANTS Left 03/01/2019   Procedure: PLACEMENT OF SALINE BREAST IMPLANT;  Surgeon: Etter Sjogren, MD;  Location: [facility] OR;  Service: Plastics;  Laterality: Left;   PORTACATH PLACEMENT  02/11/11   PORTACATH REMOVAL  MARCH 2014   RESECTOSCOPIC POLYPECTOMY  2001   MYOMECTOMY   SOFT TISSUE CYST EXCISION     back cyst   VAGINAL MASS EXCISION      FAMILY HISTORY Family History  Problem Relation Age of Onset   Cancer Maternal Uncle 109       prostate, metastatic   Alcohol abuse Father    Cirrhosis Father    Breast cancer Mother 60       Stage 1   Hypertension Mother     SOCIAL HISTORY:  Social History   Socioeconomic History   Marital status: Divorced    Spouse name: Not on file   Number of children: Not on  file   Years of education: Not on file   Highest education level: Not on file  Occupational History   Not on file  Tobacco Use   Smoking status: Former    Current packs/day: 0.00    Average packs/day: 1 pack/day for 21.0 years (21.0 ttl pk-yrs)    Types: Cigarettes    Start date: 03/24/1978    Quit date: 03/25/1999    Years since quitting: 24.0   Smokeless tobacco: Never  Vaping Use   Vaping status: Never Used  Substance and Sexual Activity   Alcohol use: Yes    Alcohol/week: 7.0 standard drinks of alcohol    Types: 7 Glasses of wine per week    Comment: red wine   Drug use: No   Sexual activity: Yes    Birth control/protection:  Post-menopausal  Other Topics Concern   Not on file  Social History Narrative   Not on file   Social Determinants of Health   Financial Resource Strain: Not on file  Food Insecurity: No Food Insecurity (10/16/2022)   Hunger Vital Sign    Worried About Running Out of Food in the Last Year: Never true    Ran Out of Food in the Last Year: Never true  Transportation Needs: No Transportation Needs (10/16/2022)   PRAPARE - Administrator, Civil Service (Medical): No    Lack of Transportation (Non-Medical): No  Physical Activity: Not on file  Stress: Not on file  Social Connections: Not on file     HEALTH MAINTENANCE: Social History   Tobacco Use   Smoking status: Former    Current packs/day: 0.00    Average packs/day: 1 pack/day for 21.0 years (21.0 ttl pk-yrs)    Types: Cigarettes    Start date: 03/24/1978    Quit date: 03/25/1999    Years since quitting: 24.0   Smokeless tobacco: Never  Vaping Use   Vaping status: Never Used  Substance Use Topics   Alcohol use: Yes    Alcohol/week: 7.0 standard drinks of alcohol    Types: 7 Glasses of wine per week    Comment: red wine   Drug use: No    Allergies  Allergen Reactions   Lisinopril Cough    Current Outpatient Medications  Medication Sig Dispense Refill   amLODipine-olmesartan (AZOR) 10-40 MG per tablet Take 1 tablet by mouth daily. (Patient taking differently: Take 1 tablet by mouth at bedtime.) 30 tablet 6   calcium carbonate (OS-CAL) 1250 (500 Ca) MG chewable tablet Chew by mouth. (Patient not taking: Reported on 05/05/2022)     cholecalciferol (VITAMIN D3) 25 MCG (1000 UNIT) tablet 1 tablet Orally Once a day     ELDERBERRY PO Take 1 tablet by mouth as needed.     metFORMIN (GLUCOPHAGE-XR) 500 MG 24 hr tablet Take 500 mg by mouth 2 (two) times daily.     Multiple Vitamin (MULTIVITAMIN) capsule Take 1 capsule by mouth daily.     OZEMPIC, 1 MG/DOSE, 4 MG/3ML SOPN Inject 1 mg into the skin once a week.      solifenacin (VESICARE) 5 MG tablet Take 5 mg by mouth daily.     No current facility-administered medications for this visit.    PHYSICAL EXAM: Vitals:   04/13/23 1318  BP: (!) 144/83  Pulse: 84  Resp: 18  Temp: 98.3 F (36.8 C)  SpO2: 99%     Body mass index is 42.09 kg/m.    ECOG FS: 1 Filed Weights  04/13/23 1318  Weight: 215 lb 8 oz (97.8 kg)    Constitutional: Oriented to person, place, and time and well-developed, well-nourished, and in no distress.  HENT:  Head: Normocephalic and atraumatic.  Eyes: Conjunctivae are normal. Right eye exhibits no discharge. Left eye exhibits no discharge. No scleral icterus.  Neck: Normal range of motion. Neck supple.   Chest: Clear to auscultation bilaterally Breast: S/P left mastectomy with implant. No palpable breast masses or adenopathy appreciated in both breasts. Implant appears to be displaced outwards.   LAB RESULTS: Lab Results  Component Value Date   WBC 5.1 04/13/2023   NEUTROABS 3.3 04/13/2023   HGB 12.1 04/13/2023   HCT 37.5 04/13/2023   MCV 82.1 04/13/2023   PLT 299 04/13/2023      Chemistry      Component Value Date/Time   NA 140 10/20/2022 1306   NA 140 04/22/2017 1327   K 3.5 10/20/2022 1306   K 4.0 04/22/2017 1327   CL 108 10/20/2022 1306   CL 105 09/08/2012 1320   CO2 24 10/20/2022 1306   CO2 25 04/22/2017 1327   BUN 18 10/20/2022 1306   BUN 15.0 04/22/2017 1327   CREATININE 0.65 10/20/2022 1306   CREATININE 0.8 04/22/2017 1327      Component Value Date/Time   CALCIUM 9.2 10/20/2022 1306   CALCIUM 9.0 04/22/2017 1327   ALKPHOS 52 10/20/2022 1306   ALKPHOS 58 04/22/2017 1327   AST 12 (L) 10/20/2022 1306   AST 13 04/22/2017 1327   ALT 9 10/20/2022 1306   ALT 11 04/22/2017 1327   BILITOT 0.4 10/20/2022 1306   BILITOT 0.38 04/22/2017 1327      ASSESSMENT: [name] is a 67 y.o. female who presents to the clinic for a follow up for recurrent breast cancer.  1. Status post left  lumpectomy and sentinel lymph node sampling August 2012 for a T1cN0, stage IA invasive ductal carcinoma of overlapping sites, grade 3, estrogen receptor 98% and progesterone receptor 13% positive, HER-2/neu amplified with a ratio of 2.3.   2. Oncotype showed a recurrence score of 43 ( "high risk"), predicting a 29% risk of recurrence if the patient's only adjuvant treatment was tamoxifen   3. received adjuvant docetaxel/ cyclophosphamide x4, completed December 2012   4. trastuzumab started 04/15/2011, completed 05/05/2012; final echo 02/18/2012  5. radiation therapy, completed July 23, 2011  6. began on tamoxifen March 2013, but never taken consistently   RECURRENT DISEASE: November 2018 7.  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  Anastrozole started 04/22/2017, discontinued January 2020, poorly tolerated  (a) mammography at [facility] 03/20/2018 shows evidence of growth  (b) exemestane started January 2020, continued with significant interruptions  (c) bone density 09/27/2019 showed a T score of -1.0.  (d) exemestane discontinued April 2021 with multiple side effects  9.  Status post left mastectomy with sentinel lymph node sampling 03/01/2019 for a pT2 pN0, stage IB  invasive ductal carcinoma, grade 2, with negative margins.  The invasive tumor was estrogen and progesterone receptor positive, with an MIB-1 of 30%.  HER-2 was not amplified.  (a) immediate left latissimus reconstruction with saline implant placement  10.  Genetics testing 07/17/2018 through the Common Hereditary Cancers Panel offered by [facility] found no deleterious mutations in APC, ATM, AXIN2, BARD1, BMPR1A, BRCA1, BRCA2, BRIP1, CDH1,  CDKN2A (p14ARF), CDKN2A (p16INK4a), CKD4, CHEK2, CTNNA1, DICER1, EPCAM (Deletion/duplication testing  only), GREM1 (promoter region deletion/duplication testing only), KIT, MEN1, MLH1, MSH2, MSH3, MSH6, MUTYH, NBN, NF1, NHTL1, PALB2, PDGFRA, PMS2, POLD1, POLE, PTEN, RAD50, RAD51C, RAD51D, SDHB, SDHC, SDHD, SMAD4, SMARCA4. STK11, TP53, TSC1, TSC2, and VHL.  The following genes were evaluated for sequence changes only: SDHA and HOXB13 c.251G>A variant only  11.  Fulvestrant started 08/28/2019, repeated every 28 days--to be continued through December 2024   PLAN:  Breast Cancer Completed 5 years of treatment with anti estrogen therapy. No new concerns or symptoms. -Discontinue Faslodex injections. -Continue annual mammograms for right breast. -Consider consultation with plastic surgeon (Dr. Arita Miss) for possible revision of left breast reconstruction and potential right breast surgery for symmetry. She will keep [place] posted if she wishes for a referral.  Osteopenia Mild osteopenia noted on previous bone density scan. -Continue weight-bearing exercises, calcium, and vitamin D supplementation. -Repeat bone density scan in summer 2025. Ordered.  Vaccinations Patient inquired about receiving Shingrix and COVID-19 booster vaccines. -Can receive Shingrix and COVID-19 booster at any time, as patient is not immunocompromised.   Follow-up Patient can return as needed or continue annually with SCP. She prefers PRN. Patient expressed understanding and satisfaction with the plan provided.   I have spent a total of 30 minutes minutes of face-to-face and non-face-to-face time, preparing to see the patient,performing a medically appropriate examination, counseling and educating the patient, ordering tests/procedures,documenting clinical information in the electronic health record,and care coordination.

## 2023-04-13 NOTE — Progress Notes (Signed)
[facility]  [phone] [phone]   ID: [name]   DOB: 1956/04/08  MR#: [id number]  JYN#:[id number]  Patient Care Team: Cleatis Polka., MD as PCP - General (Internal Medicine) Etter Sjogren, MD as Consulting Physician (Plastic Surgery) Rachel Moulds, MD as Consulting Physician (Hematology and Oncology)   CHIEF COMPLAINT:  Left Breast Cancer (s/p left mastectomy)  CURRENT TREATMENT: Fulvestrant  INTERVAL HISTORY:  [name] returns for a follow up for recurrent left breast cancer. She is unaccompanied for this visit.  She continues on fulvestrant every 4 weeks.  She reports some constipation, abnormal taste after Faslodex injection and some ongoing right hamstring pain especially when she moves it a certain way.  She otherwise has been tolerating it remarkably well.  She was hopeful that we will discontinue the medication sooner but she is willing to continue it till December like Dr. Darnelle Catalan requested.  She is exercising, her diabetes is very well-controlled.  She is not very satisfied with the cosmetic outcome of the surgery but is not willing to go through more surgeries.  She denies any breast changes otherwise.  No change in breathing, bowel habits or urinary habits.  No new neurological complaints.  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   Family history of breast cancer 06/23/2018   Family history of prostate cancer 06/23/2018   Gum disease    Hyperlipidemia    diet and exercise controlled, no med   Hypertension    Pre-diabetes    borderline - diet and exercise contolled, no meds   Sleep apnea    uses CPAP nightly   Urinary incontinence     PAST SURGICAL HISTORY: Past Surgical  History:  Procedure Laterality Date   BREAST LUMPECTOMY  2012   left   COLONOSCOPY     FOOT MASS EXCISION     LAPAROSCOPIC GASTRIC BANDING     LATISSIMUS FLAP TO BREAST Left 03/01/2019   Procedure: LEFT BREAST LATIISSIMUS FLAP;  Surgeon: Etter Sjogren, MD;  Location: [facility] OR;  Service: Plastics;  Laterality: Left;   MASS EXCISION     back   MASTECTOMY W/ SENTINEL NODE BIOPSY Left 03/01/2019   Procedure: LEFT MASTECTOMY WITH LEFT SENTINEL LYMPH NODE BIOPSY;  Surgeon: Ovidio Kin, MD;  Location: [facility] OR;  Service: General;  Laterality: Left;   PLACEMENT OF BREAST IMPLANTS Left 03/01/2019   Procedure: PLACEMENT OF SALINE BREAST IMPLANT;  Surgeon: Etter Sjogren, MD;  Location: [facility] OR;  Service: Plastics;  Laterality: Left;   PORTACATH PLACEMENT  02/11/11   PORTACATH REMOVAL  MARCH 2014   RESECTOSCOPIC POLYPECTOMY  2001   MYOMECTOMY   SOFT TISSUE CYST EXCISION     back cyst   VAGINAL MASS EXCISION      FAMILY HISTORY Family History  Problem Relation Age of Onset   Cancer Maternal Uncle 28       prostate, metastatic   Alcohol abuse Father    Cirrhosis Father    Breast cancer Mother 61       Stage 1   Hypertension Mother     SOCIAL HISTORY:  Social History   Socioeconomic History   Marital status: Divorced  Spouse name: Not on file   Number of children: Not on file   Years of education: Not on file   Highest education level: Not on file  Occupational History   Not on file  Tobacco Use   Smoking status: Former    Current packs/day: 0.00    Average packs/day: 1 pack/day for 21.0 years (21.0 ttl pk-yrs)    Types: Cigarettes    Start date: 03/24/1978    Quit date: 03/25/1999    Years since quitting: 24.0   Smokeless tobacco: Never  Vaping Use   Vaping status: Never Used  Substance and Sexual Activity   Alcohol use: Yes    Alcohol/week: 7.0 standard drinks of alcohol    Types: 7 Glasses of wine per week    Comment: red wine   Drug use: No   Sexual activity: Yes     Birth control/protection: Post-menopausal  Other Topics Concern   Not on file  Social History Narrative   Not on file   Social Determinants of Health   Financial Resource Strain: Not on file  Food Insecurity: No Food Insecurity (10/16/2022)   Hunger Vital Sign    Worried About Running Out of Food in the Last Year: Never true    Ran Out of Food in the Last Year: Never true  Transportation Needs: No Transportation Needs (10/16/2022)   PRAPARE - Administrator, Civil Service (Medical): No    Lack of Transportation (Non-Medical): No  Physical Activity: Not on file  Stress: Not on file  Social Connections: Not on file     HEALTH MAINTENANCE: Social History   Tobacco Use   Smoking status: Former    Current packs/day: 0.00    Average packs/day: 1 pack/day for 21.0 years (21.0 ttl pk-yrs)    Types: Cigarettes    Start date: 03/24/1978    Quit date: 03/25/1999    Years since quitting: 24.0   Smokeless tobacco: Never  Vaping Use   Vaping status: Never Used  Substance Use Topics   Alcohol use: Yes    Alcohol/week: 7.0 standard drinks of alcohol    Types: 7 Glasses of wine per week    Comment: red wine   Drug use: No    Allergies  Allergen Reactions   Lisinopril Cough    Current Outpatient Medications  Medication Sig Dispense Refill   amLODipine-olmesartan (AZOR) 10-40 MG per tablet Take 1 tablet by mouth daily. (Patient taking differently: Take 1 tablet by mouth at bedtime.) 30 tablet 6   calcium carbonate (OS-CAL) 1250 (500 Ca) MG chewable tablet Chew by mouth. (Patient not taking: Reported on 05/05/2022)     cholecalciferol (VITAMIN D3) 25 MCG (1000 UNIT) tablet 1 tablet Orally Once a day     ELDERBERRY PO Take 1 tablet by mouth as needed.     metFORMIN (GLUCOPHAGE-XR) 500 MG 24 hr tablet Take 500 mg by mouth 2 (two) times daily.     Multiple Vitamin (MULTIVITAMIN) capsule Take 1 capsule by mouth daily.     OZEMPIC, 1 MG/DOSE, 4 MG/3ML SOPN Inject 1 mg into the  skin once a week.     solifenacin (VESICARE) 5 MG tablet Take 5 mg by mouth daily.     No current facility-administered medications for this visit.    PHYSICAL EXAM: Vitals:   04/13/23 1318  BP: (!) 144/83  Pulse: 84  Resp: 18  Temp: 98.3 F (36.8 C)  SpO2: 99%     Body mass index  is 42.09 kg/m.    ECOG FS: 1 Filed Weights   04/13/23 1318  Weight: 215 lb 8 oz (97.8 kg)    Constitutional: Oriented to person, place, and time and well-developed, well-nourished, and in no distress.  HENT:  Head: Normocephalic and atraumatic.  Eyes: Conjunctivae are normal. Right eye exhibits no discharge. Left eye exhibits no discharge. No scleral icterus.  Neck: Normal range of motion. Neck supple.   Chest: Clear to auscultation bilaterally Breast: S/P left mastectomy with implant. No palpable breast masses or adenopathy appreciated.    LAB RESULTS: Lab Results  Component Value Date   WBC 5.1 04/13/2023   NEUTROABS 3.3 04/13/2023   HGB 12.1 04/13/2023   HCT 37.5 04/13/2023   MCV 82.1 04/13/2023   PLT 299 04/13/2023      Chemistry      Component Value Date/Time   NA 140 10/20/2022 1306   NA 140 04/22/2017 1327   K 3.5 10/20/2022 1306   K 4.0 04/22/2017 1327   CL 108 10/20/2022 1306   CL 105 09/08/2012 1320   CO2 24 10/20/2022 1306   CO2 25 04/22/2017 1327   BUN 18 10/20/2022 1306   BUN 15.0 04/22/2017 1327   CREATININE 0.65 10/20/2022 1306   CREATININE 0.8 04/22/2017 1327      Component Value Date/Time   CALCIUM 9.2 10/20/2022 1306   CALCIUM 9.0 04/22/2017 1327   ALKPHOS 52 10/20/2022 1306   ALKPHOS 58 04/22/2017 1327   AST 12 (L) 10/20/2022 1306   AST 13 04/22/2017 1327   ALT 9 10/20/2022 1306   ALT 11 04/22/2017 1327   BILITOT 0.4 10/20/2022 1306   BILITOT 0.38 04/22/2017 1327      ASSESSMENT: [name] is a 67 y.o. female who presents to the clinic for a follow up for recurrent breast cancer.  1. Status post left lumpectomy and sentinel lymph node sampling  August 2012 for a T1cN0, stage IA invasive ductal carcinoma of overlapping sites, grade 3, estrogen receptor 98% and progesterone receptor 13% positive, HER-2/neu amplified with a ratio of 2.3.   2. Oncotype showed a recurrence score of 43 ( "high risk"), predicting a 29% risk of recurrence if the patient's only adjuvant treatment was tamoxifen   3. received adjuvant docetaxel/ cyclophosphamide x4, completed December 2012   4. trastuzumab started 04/15/2011, completed 05/05/2012; final echo 02/18/2012  5. radiation therapy, completed July 23, 2011  6. began on tamoxifen March 2013, but never taken consistently   RECURRENT DISEASE: November 2018 7.  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  Anastrozole started 04/22/2017, discontinued January 2020, poorly tolerated  (a) mammography at [facility] 03/20/2018 shows evidence of growth  (b) exemestane started January 2020, continued with significant interruptions  (c) bone density 09/27/2019 showed a T score of -1.0.  (d) exemestane discontinued April 2021 with multiple side effects  9.  Status post left mastectomy with sentinel lymph node sampling 03/01/2019 for a pT2 pN0, stage IB  invasive ductal carcinoma, grade 2, with negative margins.  The invasive tumor was estrogen and progesterone receptor positive, with an MIB-1 of 30%.  HER-2 was not amplified.  (a) immediate left latissimus reconstruction with saline implant placement  10.  Genetics testing 07/17/2018 through the Common Hereditary Cancers Panel offered by [facility] found no deleterious mutations in APC, ATM, AXIN2, BARD1,  BMPR1A, BRCA1, BRCA2, BRIP1, CDH1, CDKN2A (p14ARF), CDKN2A (p16INK4a), CKD4, CHEK2, CTNNA1, DICER1, EPCAM (Deletion/duplication testing only), GREM1 (promoter region  deletion/duplication testing only), KIT, MEN1, MLH1, MSH2, MSH3, MSH6, MUTYH, NBN, NF1, NHTL1, PALB2, PDGFRA, PMS2, POLD1, POLE, PTEN, RAD50, RAD51C, RAD51D, SDHB, SDHC, SDHD, SMAD4, SMARCA4. STK11, TP53, TSC1, TSC2, and VHL.  The following genes were evaluated for sequence changes only: SDHA and HOXB13 c.251G>A variant only  11.  Fulvestrant started 08/28/2019, repeated every 28 days--to be continued through December 2024   PLAN:  -Patient returns for a follow up while on Fulvestrant q 28 days. Recommend to continue until December 2024.  -Most recent mammogram from 10/16/2021 reviewed and was negative for malignancy. Next due in one year around June 2024.  She says this is already scheduled -Most recent bone density scan from 11/05/2021 reviewed and results show T-score -1.1 consistent with osteopenia. Recommend to take calcium/vitamin D supplementation with weightbearing exercises.Next bone density due in 2 years around June 2025. -RTC in 6 months with labs and f/u visit   Patient expressed understanding and satisfaction with the plan provided.   I have spent a total of 30 minutes minutes of face-to-face and non-face-to-face time, preparing to see the patient,performing a medically appropriate examination, counseling and educating the patient, ordering tests/procedures,documenting clinical information in the electronic health record,and care coordination.    Georga Kaufmann PA-C Dept of Hematology and Oncology [facility] Phone: [phone]

## 2023-04-28 DIAGNOSIS — C50912 Malignant neoplasm of unspecified site of left female breast: Secondary | ICD-10-CM | POA: Diagnosis not present

## 2023-05-09 ENCOUNTER — Encounter: Payer: Self-pay | Admitting: Oncology

## 2023-05-20 DIAGNOSIS — E785 Hyperlipidemia, unspecified: Secondary | ICD-10-CM | POA: Diagnosis not present

## 2023-05-20 DIAGNOSIS — M8580 Other specified disorders of bone density and structure, unspecified site: Secondary | ICD-10-CM | POA: Diagnosis not present

## 2023-05-20 DIAGNOSIS — R7301 Impaired fasting glucose: Secondary | ICD-10-CM | POA: Diagnosis not present

## 2023-05-20 DIAGNOSIS — I10 Essential (primary) hypertension: Secondary | ICD-10-CM | POA: Diagnosis not present

## 2023-05-27 DIAGNOSIS — I10 Essential (primary) hypertension: Secondary | ICD-10-CM | POA: Diagnosis not present

## 2023-05-27 DIAGNOSIS — E785 Hyperlipidemia, unspecified: Secondary | ICD-10-CM | POA: Diagnosis not present

## 2023-05-27 DIAGNOSIS — Z6841 Body Mass Index (BMI) 40.0 and over, adult: Secondary | ICD-10-CM | POA: Diagnosis not present

## 2023-05-27 DIAGNOSIS — G4733 Obstructive sleep apnea (adult) (pediatric): Secondary | ICD-10-CM | POA: Diagnosis not present

## 2023-05-27 DIAGNOSIS — C50912 Malignant neoplasm of unspecified site of left female breast: Secondary | ICD-10-CM | POA: Diagnosis not present

## 2023-05-27 DIAGNOSIS — Z170 Estrogen receptor positive status [ER+]: Secondary | ICD-10-CM | POA: Diagnosis not present

## 2023-05-27 DIAGNOSIS — R82998 Other abnormal findings in urine: Secondary | ICD-10-CM | POA: Diagnosis not present

## 2023-05-27 DIAGNOSIS — Z0000 Encounter for general adult medical examination without abnormal findings: Secondary | ICD-10-CM | POA: Diagnosis not present

## 2023-05-27 DIAGNOSIS — R809 Proteinuria, unspecified: Secondary | ICD-10-CM | POA: Diagnosis not present

## 2023-05-31 ENCOUNTER — Other Ambulatory Visit: Payer: Self-pay | Admitting: Internal Medicine

## 2023-05-31 DIAGNOSIS — E785 Hyperlipidemia, unspecified: Secondary | ICD-10-CM

## 2023-06-24 ENCOUNTER — Encounter: Payer: Self-pay | Admitting: Oncology

## 2023-06-24 ENCOUNTER — Ambulatory Visit
Admission: RE | Admit: 2023-06-24 | Discharge: 2023-06-24 | Disposition: A | Discharge status: Home / Self Care | Payer: No Typology Code available for payment source | Source: Ambulatory Visit | Attending: Internal Medicine | Admitting: Internal Medicine

## 2023-06-24 DIAGNOSIS — E785 Hyperlipidemia, unspecified: Secondary | ICD-10-CM

## 2023-07-29 DIAGNOSIS — H524 Presbyopia: Secondary | ICD-10-CM | POA: Diagnosis not present

## 2023-07-29 DIAGNOSIS — H35361 Drusen (degenerative) of macula, right eye: Secondary | ICD-10-CM | POA: Diagnosis not present

## 2023-07-29 DIAGNOSIS — E119 Type 2 diabetes mellitus without complications: Secondary | ICD-10-CM | POA: Diagnosis not present

## 2023-07-29 DIAGNOSIS — H25813 Combined forms of age-related cataract, bilateral: Secondary | ICD-10-CM | POA: Diagnosis not present

## 2023-08-16 DIAGNOSIS — I10 Essential (primary) hypertension: Secondary | ICD-10-CM | POA: Diagnosis not present

## 2023-08-16 DIAGNOSIS — E785 Hyperlipidemia, unspecified: Secondary | ICD-10-CM | POA: Diagnosis not present

## 2023-08-16 DIAGNOSIS — G4733 Obstructive sleep apnea (adult) (pediatric): Secondary | ICD-10-CM | POA: Diagnosis not present

## 2023-08-16 DIAGNOSIS — R7301 Impaired fasting glucose: Secondary | ICD-10-CM | POA: Diagnosis not present

## 2023-08-16 DIAGNOSIS — E669 Obesity, unspecified: Secondary | ICD-10-CM | POA: Diagnosis not present

## 2023-08-17 DIAGNOSIS — G4733 Obstructive sleep apnea (adult) (pediatric): Secondary | ICD-10-CM | POA: Diagnosis not present

## 2023-10-12 DIAGNOSIS — R0981 Nasal congestion: Secondary | ICD-10-CM | POA: Diagnosis not present

## 2023-10-12 DIAGNOSIS — J3489 Other specified disorders of nose and nasal sinuses: Secondary | ICD-10-CM | POA: Diagnosis not present

## 2023-10-12 DIAGNOSIS — R051 Acute cough: Secondary | ICD-10-CM | POA: Diagnosis not present

## 2023-10-12 DIAGNOSIS — J069 Acute upper respiratory infection, unspecified: Secondary | ICD-10-CM | POA: Diagnosis not present

## 2023-11-08 DIAGNOSIS — G4733 Obstructive sleep apnea (adult) (pediatric): Secondary | ICD-10-CM | POA: Diagnosis not present

## 2023-11-08 DIAGNOSIS — E669 Obesity, unspecified: Secondary | ICD-10-CM | POA: Diagnosis not present

## 2023-11-08 DIAGNOSIS — I10 Essential (primary) hypertension: Secondary | ICD-10-CM | POA: Diagnosis not present

## 2023-11-08 DIAGNOSIS — E785 Hyperlipidemia, unspecified: Secondary | ICD-10-CM | POA: Diagnosis not present

## 2023-11-08 DIAGNOSIS — R7301 Impaired fasting glucose: Secondary | ICD-10-CM | POA: Diagnosis not present

## 2023-11-17 DIAGNOSIS — G4733 Obstructive sleep apnea (adult) (pediatric): Secondary | ICD-10-CM | POA: Diagnosis not present

## 2023-12-12 DIAGNOSIS — Z1231 Encounter for screening mammogram for malignant neoplasm of breast: Secondary | ICD-10-CM | POA: Diagnosis not present

## 2023-12-12 DIAGNOSIS — M8580 Other specified disorders of bone density and structure, unspecified site: Secondary | ICD-10-CM | POA: Diagnosis not present

## 2023-12-12 DIAGNOSIS — Z853 Personal history of malignant neoplasm of breast: Secondary | ICD-10-CM | POA: Diagnosis not present

## 2023-12-18 DIAGNOSIS — G4733 Obstructive sleep apnea (adult) (pediatric): Secondary | ICD-10-CM | POA: Diagnosis not present

## 2023-12-20 ENCOUNTER — Encounter: Payer: Self-pay | Admitting: Hematology and Oncology

## 2024-01-18 DIAGNOSIS — G4733 Obstructive sleep apnea (adult) (pediatric): Secondary | ICD-10-CM | POA: Diagnosis not present

## 2024-02-17 DIAGNOSIS — G4733 Obstructive sleep apnea (adult) (pediatric): Secondary | ICD-10-CM | POA: Diagnosis not present

## 2024-03-07 DIAGNOSIS — M8580 Other specified disorders of bone density and structure, unspecified site: Secondary | ICD-10-CM | POA: Diagnosis not present

## 2024-03-07 DIAGNOSIS — I10 Essential (primary) hypertension: Secondary | ICD-10-CM | POA: Diagnosis not present

## 2024-03-07 DIAGNOSIS — E785 Hyperlipidemia, unspecified: Secondary | ICD-10-CM | POA: Diagnosis not present

## 2024-03-07 DIAGNOSIS — Z7984 Long term (current) use of oral hypoglycemic drugs: Secondary | ICD-10-CM | POA: Diagnosis not present

## 2024-03-07 DIAGNOSIS — G4733 Obstructive sleep apnea (adult) (pediatric): Secondary | ICD-10-CM | POA: Diagnosis not present

## 2024-03-07 DIAGNOSIS — Z7985 Long-term (current) use of injectable non-insulin antidiabetic drugs: Secondary | ICD-10-CM | POA: Diagnosis not present

## 2024-03-07 DIAGNOSIS — E1136 Type 2 diabetes mellitus with diabetic cataract: Secondary | ICD-10-CM | POA: Diagnosis not present

## 2024-03-07 DIAGNOSIS — Z6841 Body Mass Index (BMI) 40.0 and over, adult: Secondary | ICD-10-CM | POA: Diagnosis not present

## 2024-03-19 DIAGNOSIS — G4733 Obstructive sleep apnea (adult) (pediatric): Secondary | ICD-10-CM | POA: Diagnosis not present

## 2024-05-16 ENCOUNTER — Other Ambulatory Visit: Payer: Self-pay

## 2024-05-16 ENCOUNTER — Encounter: Payer: Self-pay | Admitting: Oncology

## 2024-05-16 MED ORDER — ZEPBOUND 2.5 MG/0.5ML ~~LOC~~ SOAJ
2.5000 mg | SUBCUTANEOUS | 0 refills | Status: DC
  Filled 2024-05-16: qty 2, 28d supply, fill #0

## 2024-05-16 MED ORDER — ZEPBOUND 5 MG/0.5ML ~~LOC~~ SOAJ: 5.0000 mg | SUBCUTANEOUS | 5 refills | Status: AC

## 2024-05-17 ENCOUNTER — Other Ambulatory Visit: Payer: Self-pay

## 2024-05-18 ENCOUNTER — Other Ambulatory Visit: Payer: Self-pay

## 2024-06-11 ENCOUNTER — Other Ambulatory Visit: Payer: Self-pay

## 2024-06-11 ENCOUNTER — Other Ambulatory Visit (HOSPITAL_BASED_OUTPATIENT_CLINIC_OR_DEPARTMENT_OTHER): Payer: Self-pay

## 2024-06-11 MED ORDER — ZEPBOUND 5 MG/0.5ML ~~LOC~~ SOAJ
5.0000 mg | SUBCUTANEOUS | 5 refills | Status: AC
  Filled 2024-06-11: qty 2, 28d supply, fill #0

## 2024-06-15 ENCOUNTER — Other Ambulatory Visit: Payer: Self-pay
